# Patient Record
Sex: Female | Born: 1935 | Race: White | Hispanic: No | Smoking: Former smoker
Health system: Southern US, Community
[De-identification: ages and names within clinical notes are randomized; demographics above are authoritative.]

## PROBLEM LIST (undated history)

## (undated) DIAGNOSIS — J449 Chronic obstructive pulmonary disease, unspecified: Secondary | ICD-10-CM

## (undated) DIAGNOSIS — I10 Essential (primary) hypertension: Secondary | ICD-10-CM

## (undated) DIAGNOSIS — Z87891 Personal history of nicotine dependence: Secondary | ICD-10-CM

## (undated) DIAGNOSIS — H409 Unspecified glaucoma: Secondary | ICD-10-CM

## (undated) DIAGNOSIS — N289 Disorder of kidney and ureter, unspecified: Secondary | ICD-10-CM

## (undated) DIAGNOSIS — E039 Hypothyroidism, unspecified: Secondary | ICD-10-CM

## (undated) DIAGNOSIS — J302 Other seasonal allergic rhinitis: Secondary | ICD-10-CM

---

## 2014-01-08 ENCOUNTER — Inpatient Hospital Stay (HOSPITAL_COMMUNITY)
Admission: AD | Admit: 2014-01-08 | Discharge: 2014-01-21 | DRG: 682 | Disposition: A | Discharge status: Skilled Nursing Facility | Payer: Medicare Other | Source: Other Acute Inpatient Hospital | Attending: Internal Medicine | Admitting: Internal Medicine

## 2014-01-08 ENCOUNTER — Inpatient Hospital Stay (HOSPITAL_COMMUNITY): Payer: Medicare Other

## 2014-01-08 DIAGNOSIS — R0902 Hypoxemia: Secondary | ICD-10-CM

## 2014-01-08 DIAGNOSIS — G9340 Encephalopathy, unspecified: Secondary | ICD-10-CM | POA: Diagnosis present

## 2014-01-08 DIAGNOSIS — E872 Acidosis, unspecified: Secondary | ICD-10-CM | POA: Diagnosis present

## 2014-01-08 DIAGNOSIS — E039 Hypothyroidism, unspecified: Secondary | ICD-10-CM | POA: Diagnosis present

## 2014-01-08 DIAGNOSIS — N178 Other acute kidney failure: Secondary | ICD-10-CM | POA: Diagnosis present

## 2014-01-08 DIAGNOSIS — N179 Acute kidney failure, unspecified: Secondary | ICD-10-CM | POA: Diagnosis present

## 2014-01-08 DIAGNOSIS — R630 Anorexia: Secondary | ICD-10-CM | POA: Diagnosis not present

## 2014-01-08 DIAGNOSIS — E785 Hyperlipidemia, unspecified: Secondary | ICD-10-CM | POA: Diagnosis not present

## 2014-01-08 DIAGNOSIS — I82622 Acute embolism and thrombosis of deep veins of left upper extremity: Secondary | ICD-10-CM | POA: Diagnosis present

## 2014-01-08 DIAGNOSIS — I959 Hypotension, unspecified: Secondary | ICD-10-CM | POA: Diagnosis present

## 2014-01-08 DIAGNOSIS — N170 Acute kidney failure with tubular necrosis: Principal | ICD-10-CM | POA: Diagnosis present

## 2014-01-08 DIAGNOSIS — R5381 Other malaise: Secondary | ICD-10-CM | POA: Diagnosis not present

## 2014-01-08 DIAGNOSIS — Z79899 Other long term (current) drug therapy: Secondary | ICD-10-CM

## 2014-01-08 DIAGNOSIS — K219 Gastro-esophageal reflux disease without esophagitis: Secondary | ICD-10-CM | POA: Diagnosis present

## 2014-01-08 DIAGNOSIS — K5900 Constipation, unspecified: Secondary | ICD-10-CM | POA: Diagnosis not present

## 2014-01-08 DIAGNOSIS — K529 Noninfective gastroenteritis and colitis, unspecified: Secondary | ICD-10-CM | POA: Diagnosis not present

## 2014-01-08 DIAGNOSIS — Z452 Encounter for adjustment and management of vascular access device: Secondary | ICD-10-CM

## 2014-01-08 DIAGNOSIS — E875 Hyperkalemia: Secondary | ICD-10-CM | POA: Diagnosis present

## 2014-01-08 DIAGNOSIS — E8729 Other acidosis: Secondary | ICD-10-CM | POA: Insufficient documentation

## 2014-01-08 DIAGNOSIS — J9601 Acute respiratory failure with hypoxia: Secondary | ICD-10-CM | POA: Diagnosis present

## 2014-01-08 DIAGNOSIS — Z7982 Long term (current) use of aspirin: Secondary | ICD-10-CM | POA: Diagnosis not present

## 2014-01-08 DIAGNOSIS — E876 Hypokalemia: Secondary | ICD-10-CM | POA: Insufficient documentation

## 2014-01-08 DIAGNOSIS — J449 Chronic obstructive pulmonary disease, unspecified: Secondary | ICD-10-CM | POA: Diagnosis not present

## 2014-01-08 DIAGNOSIS — K567 Ileus, unspecified: Secondary | ICD-10-CM

## 2014-01-08 DIAGNOSIS — R197 Diarrhea, unspecified: Secondary | ICD-10-CM | POA: Diagnosis present

## 2014-01-08 DIAGNOSIS — J810 Acute pulmonary edema: Secondary | ICD-10-CM | POA: Insufficient documentation

## 2014-01-08 DIAGNOSIS — I10 Essential (primary) hypertension: Secondary | ICD-10-CM | POA: Diagnosis present

## 2014-01-08 DIAGNOSIS — D649 Anemia, unspecified: Secondary | ICD-10-CM | POA: Diagnosis not present

## 2014-01-08 DIAGNOSIS — R579 Shock, unspecified: Secondary | ICD-10-CM | POA: Diagnosis present

## 2014-01-08 DIAGNOSIS — I493 Ventricular premature depolarization: Secondary | ICD-10-CM | POA: Diagnosis not present

## 2014-01-08 DIAGNOSIS — I5040 Unspecified combined systolic (congestive) and diastolic (congestive) heart failure: Secondary | ICD-10-CM | POA: Diagnosis present

## 2014-01-08 DIAGNOSIS — E038 Other specified hypothyroidism: Secondary | ICD-10-CM | POA: Insufficient documentation

## 2014-01-08 HISTORY — DX: Personal history of nicotine dependence: Z87.891

## 2014-01-08 HISTORY — DX: Chronic obstructive pulmonary disease, unspecified: J44.9

## 2014-01-08 HISTORY — DX: Hypothyroidism, unspecified: E03.9

## 2014-01-08 HISTORY — DX: Essential (primary) hypertension: I10

## 2014-01-08 HISTORY — DX: Other seasonal allergic rhinitis: J30.2

## 2014-01-08 HISTORY — DX: Unspecified glaucoma: H40.9

## 2014-01-08 LAB — BLOOD GAS, ARTERIAL
ACID-BASE DEFICIT: 14.7 mmol/L — AB (ref 0.0–2.0)
Bicarbonate: 12.9 mEq/L — ABNORMAL LOW (ref 20.0–24.0)
Drawn by: 41934
FIO2: 0.28 %
O2 Saturation: 96.4 %
PATIENT TEMPERATURE: 98.6
PCO2 ART: 41.9 mmHg (ref 35.0–45.0)
TCO2: 14.2 mmol/L (ref 0–100)
pH, Arterial: 7.116 — CL (ref 7.350–7.450)
pO2, Arterial: 112 mmHg — ABNORMAL HIGH (ref 80.0–100.0)

## 2014-01-08 LAB — COMPREHENSIVE METABOLIC PANEL
ALBUMIN: 2.6 g/dL — AB (ref 3.5–5.2)
ALBUMIN: 2.6 g/dL — AB (ref 3.5–5.2)
ALT: 7 U/L (ref 0–35)
ALT: 7 U/L (ref 0–35)
AST: 12 U/L (ref 0–37)
AST: 8 U/L (ref 0–37)
Alkaline Phosphatase: 78 U/L (ref 39–117)
Alkaline Phosphatase: 85 U/L (ref 39–117)
Anion gap: 19 — ABNORMAL HIGH (ref 5–15)
Anion gap: 20 — ABNORMAL HIGH (ref 5–15)
BUN: 91 mg/dL — ABNORMAL HIGH (ref 6–23)
BUN: 89 mg/dL — ABNORMAL HIGH (ref 6–23)
CO2: 13 mEq/L — ABNORMAL LOW (ref 19–32)
CO2: 13 mEq/L — ABNORMAL LOW (ref 19–32)
CREATININE: 11.12 mg/dL — AB (ref 0.50–1.10)
Calcium: 9.1 mg/dL (ref 8.4–10.5)
Calcium: 9.2 mg/dL (ref 8.4–10.5)
Chloride: 105 mEq/L (ref 96–112)
Chloride: 106 mEq/L (ref 96–112)
Creatinine, Ser: 11.83 mg/dL — ABNORMAL HIGH (ref 0.50–1.10)
GFR calc Af Amer: 3 mL/min — ABNORMAL LOW (ref >90)
GFR calc Af Amer: 3 mL/min — ABNORMAL LOW (ref >90)
GFR calc non Af Amer: 3 mL/min — ABNORMAL LOW (ref >90)
GFR calc non Af Amer: 3 mL/min — ABNORMAL LOW (ref >90)
Glucose, Bld: 84 mg/dL (ref 70–99)
Glucose, Bld: 94 mg/dL (ref 70–99)
Potassium: >7.7 mEq/L (ref 3.7–5.3)
SODIUM: 139 meq/L (ref 137–147)
Sodium: 137 mEq/L (ref 137–147)
TOTAL PROTEIN: 7.1 g/dL (ref 6.0–8.3)
Total Bilirubin: <0.2 mg/dL — ABNORMAL LOW (ref 0.3–1.2)
Total Bilirubin: <0.2 mg/dL — ABNORMAL LOW (ref 0.3–1.2)
Total Protein: 7.4 g/dL (ref 6.0–8.3)

## 2014-01-08 LAB — URINE MICROSCOPIC-ADD ON

## 2014-01-08 LAB — PROTEIN / CREATININE RATIO, URINE
CREATININE, URINE: 36.48 mg/dL
Protein Creatinine Ratio: 3.14 — ABNORMAL HIGH (ref 0.00–0.15)
Total Protein, Urine: 114.7 mg/dL

## 2014-01-08 LAB — CBC WITH DIFFERENTIAL/PLATELET
Basophils Absolute: 0.1 10*3/uL (ref 0.0–0.1)
Basophils Relative: 1 % (ref 0–1)
EOS PCT: 4 % (ref 0–5)
Eosinophils Absolute: 0.4 10*3/uL (ref 0.0–0.7)
HEMATOCRIT: 40.4 % (ref 36.0–46.0)
HEMOGLOBIN: 12.9 g/dL (ref 12.0–15.0)
LYMPHS PCT: 14 % (ref 12–46)
Lymphs Abs: 1.5 10*3/uL (ref 0.7–4.0)
MCH: 29 pg (ref 26.0–34.0)
MCHC: 31.9 g/dL (ref 30.0–36.0)
MCV: 90.8 fL (ref 78.0–100.0)
MONO ABS: 0.9 10*3/uL (ref 0.1–1.0)
MONOS PCT: 9 % (ref 3–12)
Neutro Abs: 8 10*3/uL — ABNORMAL HIGH (ref 1.7–7.7)
Neutrophils Relative %: 72 % (ref 43–77)
Platelets: 253 10*3/uL (ref 150–400)
RBC: 4.45 MIL/uL (ref 3.87–5.11)
RDW: 14.4 % (ref 11.5–15.5)
WBC: 10.9 10*3/uL — AB (ref 4.0–10.5)

## 2014-01-08 LAB — URINALYSIS, ROUTINE W REFLEX MICROSCOPIC
Bilirubin Urine: NEGATIVE
Glucose, UA: NEGATIVE mg/dL
Ketones, ur: 15 mg/dL — AB
Nitrite: NEGATIVE
PROTEIN: 100 mg/dL — AB
SPECIFIC GRAVITY, URINE: 1.009 (ref 1.005–1.030)
UROBILINOGEN UA: 0.2 mg/dL (ref 0.0–1.0)
pH: 7 (ref 5.0–8.0)

## 2014-01-08 LAB — TSH: TSH: 1.84 u[IU]/mL (ref 0.350–4.500)

## 2014-01-08 LAB — MRSA PCR SCREENING: MRSA BY PCR: NEGATIVE

## 2014-01-08 LAB — GLUCOSE, CAPILLARY: Glucose-Capillary: 91 mg/dL (ref 70–99)

## 2014-01-08 MED ORDER — IPRATROPIUM BROMIDE 0.02 % IN SOLN
0.5000 mg | Freq: Four times a day (QID) | RESPIRATORY_TRACT | Status: DC
  Administered 2014-01-08: 0.5 mg via RESPIRATORY_TRACT
  Filled 2014-01-08: qty 2.5

## 2014-01-08 MED ORDER — SODIUM CHLORIDE 0.9 % IV SOLN: 100.0000 mL | INTRAVENOUS | Status: DC | PRN

## 2014-01-08 MED ORDER — ACETAMINOPHEN 650 MG RE SUPP: 650.0000 mg | Freq: Four times a day (QID) | RECTAL | Status: DC | PRN

## 2014-01-08 MED ORDER — CETYLPYRIDINIUM CHLORIDE 0.05 % MT LIQD: 7.0000 mL | Freq: Four times a day (QID) | OROMUCOSAL | Status: DC

## 2014-01-08 MED ORDER — PANTOPRAZOLE SODIUM 40 MG IV SOLR: 40.0000 mg | Freq: Every day | INTRAVENOUS | Status: DC

## 2014-01-08 MED ORDER — HEPARIN SODIUM (PORCINE) 1000 UNIT/ML DIALYSIS
1000.0000 [IU] | INTRAMUSCULAR | Status: DC | PRN
  Filled 2014-01-08: qty 1

## 2014-01-08 MED ORDER — ACETAMINOPHEN 325 MG PO TABS: 650.0000 mg | ORAL_TABLET | Freq: Four times a day (QID) | ORAL | Status: DC | PRN

## 2014-01-08 MED ORDER — SODIUM CHLORIDE 0.9 % IV SOLN: 250.0000 mL | INTRAVENOUS | Status: DC | PRN

## 2014-01-08 MED ORDER — CEFTRIAXONE SODIUM IN DEXTROSE 20 MG/ML IV SOLN
1.0000 g | INTRAVENOUS | Status: DC
  Administered 2014-01-08 – 2014-01-10 (×3): 1 g via INTRAVENOUS
  Filled 2014-01-08 (×4): qty 50

## 2014-01-08 MED ORDER — FOLIC ACID 1 MG PO TABS
1.0000 mg | ORAL_TABLET | Freq: Every day | ORAL | Status: DC
  Administered 2014-01-08 – 2014-01-21 (×14): 1 mg via ORAL
  Filled 2014-01-08 (×14): qty 1

## 2014-01-08 MED ORDER — SODIUM CHLORIDE 0.9 % IV SOLN
INTRAVENOUS | Status: AC
Start: 2014-01-08 — End: 2014-01-09
  Administered 2014-01-08 – 2014-01-09 (×2): via INTRAVENOUS

## 2014-01-08 MED ORDER — SODIUM CHLORIDE 0.9 % IJ SOLN
3.0000 mL | Freq: Two times a day (BID) | INTRAMUSCULAR | Status: DC
  Administered 2014-01-09 (×2): 3 mL via INTRAVENOUS

## 2014-01-08 MED ORDER — ADULT MULTIVITAMIN W/MINERALS CH
1.0000 | ORAL_TABLET | Freq: Every day | ORAL | Status: DC
  Administered 2014-01-08 – 2014-01-21 (×14): 1 via ORAL
  Filled 2014-01-08 (×14): qty 1

## 2014-01-08 MED ORDER — VITAMIN B-1 100 MG PO TABS
100.0000 mg | ORAL_TABLET | Freq: Every day | ORAL | Status: DC
  Administered 2014-01-08 – 2014-01-21 (×14): 100 mg via ORAL
  Filled 2014-01-08 (×14): qty 1

## 2014-01-08 MED ORDER — ONDANSETRON HCL 4 MG/2ML IJ SOLN
4.0000 mg | Freq: Four times a day (QID) | INTRAMUSCULAR | Status: DC | PRN
  Administered 2014-01-10 – 2014-01-13 (×6): 4 mg via INTRAVENOUS
  Filled 2014-01-08 (×7): qty 2

## 2014-01-08 MED ORDER — ALBUTEROL SULFATE (2.5 MG/3ML) 0.083% IN NEBU: 2.5000 mg | INHALATION_SOLUTION | RESPIRATORY_TRACT | Status: DC | PRN

## 2014-01-08 MED ORDER — SODIUM POLYSTYRENE SULFONATE 15 GM/60ML PO SUSP
45.0000 g | Freq: Once | ORAL | Status: AC
  Administered 2014-01-08: 45 g via ORAL
  Filled 2014-01-08: qty 180

## 2014-01-08 MED ORDER — CHLORHEXIDINE GLUCONATE 0.12 % MT SOLN: 15.0000 mL | Freq: Two times a day (BID) | OROMUCOSAL | Status: DC

## 2014-01-08 MED ORDER — ALBUTEROL SULFATE (2.5 MG/3ML) 0.083% IN NEBU
2.5000 mg | INHALATION_SOLUTION | Freq: Four times a day (QID) | RESPIRATORY_TRACT | Status: DC
  Administered 2014-01-08: 2.5 mg via RESPIRATORY_TRACT
  Filled 2014-01-08: qty 3

## 2014-01-08 MED ORDER — OXYCODONE HCL 5 MG PO TABS: 5.0000 mg | ORAL_TABLET | ORAL | Status: DC | PRN

## 2014-01-08 MED ORDER — ONDANSETRON HCL 4 MG PO TABS: 4.0000 mg | ORAL_TABLET | Freq: Four times a day (QID) | ORAL | Status: DC | PRN

## 2014-01-08 MED ORDER — SODIUM CHLORIDE 0.9 % IJ SOLN
3.0000 mL | Freq: Two times a day (BID) | INTRAMUSCULAR | Status: DC
  Administered 2014-01-09: 3 mL via INTRAVENOUS

## 2014-01-08 MED ORDER — HEPARIN SODIUM (PORCINE) 5000 UNIT/ML IJ SOLN
5000.0000 [IU] | Freq: Three times a day (TID) | INTRAMUSCULAR | Status: DC
  Administered 2014-01-09 – 2014-01-21 (×36): 5000 [IU] via SUBCUTANEOUS
  Filled 2014-01-08 (×41): qty 1

## 2014-01-08 MED ORDER — ALTEPLASE 2 MG IJ SOLR
2.0000 mg | Freq: Once | INTRAMUSCULAR | Status: AC | PRN
Start: 2014-01-08 — End: 2014-01-08

## 2014-01-08 MED ORDER — SODIUM CHLORIDE 0.9 % IJ SOLN: 3.0000 mL | INTRAMUSCULAR | Status: DC | PRN

## 2014-01-08 NOTE — Procedures (Signed)
Hemodialysis Insertion Procedure Note [name] [id number] 01-27-1935  Procedure: Insertion of Hemodialysis Catheter Type: 3 port  Indications: Hemodialysis   Procedure Details Consent: Risks of procedure as well as the alternatives and risks of each were explained to the (patient/caregiver).  Consent for procedure obtained. Time Out: Verified patient identification, verified procedure, site/side was marked, verified correct patient position, special equipment/implants available, medications/allergies/relevent history reviewed, required imaging and test results available.  Performed  Maximum sterile technique was used including antiseptics, cap, gloves, gown, hand hygiene, mask and sheet. Skin prep: Chlorhexidine; local anesthetic administered A antimicrobial bonded/coated triple lumen catheter was placed in the right internal jugular vein using the Seldinger technique. Ultrasound guidance used.Yes.   Catheter placed to 20 cm. Blood aspirated via all 3 ports and then flushed x 3. Line sutured x 2 and dressing applied.  Evaluation Blood flow good Complications: No apparent complications Patient did tolerate procedure well. Chest X-ray ordered to verify placement.  CXR: pending.  [name] RoachPaul Hoffman, ACNP [place] [facility] Pager [phone] or [phone]  Procedure supervised by Dr. Molli KnockYacoub.  U/S used in placement.  [name] ReedyWesam G. Yacoub, M.D. [facility]. Pager: [phone]. After hours pager: [phone].

## 2014-01-08 NOTE — Consult Note (Signed)
[name] Admit Date: 01/08/2014 01/08/2014 [name] MissSANFORD, Strong, B Requesting Physician:  Welton FlakesKhan MD  Reason for Consult:  Hyperkalemia, Renal Failure HPI:  23F presented [facility] this AM with several weeks of N/V (nb/nb), a few days of nonbloody nonmelanotic diarrhea,  Anorexia, and found ot have SCr of 14, BUN in 90s, and K of 9.  Treated with dextrose/insulin, calcium, kayexalate,a nd last K at 12p was 8.0.  Pt on 20 lasix PO daily and 20 KCL daily.  Pt has COPD and unclear what other medical problems.  Hasn't seen MD in months as outpt.  Denies any known CKD.  No recent weight loss.  Denies hematuria, rashes.  Denies NSAIDs, recent ABX.  No contrast exposures.  Urinary frequency has 'dropped off'  EKG at [facility] reviewed, no peaked Ts, QRS around 100   No results found for: CREATININE] Balance of 12 systems is negative w/ exceptions as above  PMH No past medical history on file. PSH No past surgical history on file. FH No family history on file. SH  has no tobacco, alcohol, and drug history on file. Allergies  Allergies  Allergen Reactions  . Ciprofloxacin Nausea And Vomiting   Home medications Prior to Admission medications   Medication Sig Start Date End Date Taking? Authorizing Provider  ascorbic acid (VITAMIN C) 1000 MG tablet Take 1,000 mg by mouth 3 (three) times daily.   Yes Historical Provider, MD  aspirin EC 81 MG tablet Take 81 mg by mouth daily.   Yes Historical Provider, MD  atenolol (TENORMIN) 50 MG tablet Take 50 mg by mouth daily.   Yes Historical Provider, MD  azelastine (ASTELIN) 0.1 % nasal spray Place 1 spray into both nostrils daily. Use in each nostril as directed   Yes Historical Provider, MD  Brinzolamide-Brimonidine 1-0.2 % SUSP Place 1 drop into both eyes 2 (two) times daily.   Yes Historical Provider, MD  cholecalciferol (VITAMIN D) 1000 UNITS tablet Take 1,000 Units by mouth daily.   Yes Historical Provider, MD  diltiazem (CARDIZEM) 30 MG  tablet Take 30 mg by mouth 3 (three) times daily.   Yes Historical Provider, MD  Fluticasone-Salmeterol (ADVAIR DISKUS) 250-50 MCG/DOSE AEPB Inhale 1 puff into the lungs 2 (two) times daily.   Yes Historical Provider, MD  furosemide (LASIX) 20 MG tablet Take 20 mg by mouth daily.    Yes Historical Provider, MD  Garlic 1000 MG CAPS Take 1 capsule by mouth daily.   Yes Historical Provider, MD  latanoprost (XALATAN) 0.005 % ophthalmic solution Place 1 drop into both eyes at bedtime.   Yes Historical Provider, MD  levothyroxine (SYNTHROID, LEVOTHROID) 25 MCG tablet Take 25 mcg by mouth daily before breakfast.   Yes Historical Provider, MD  loratadine (CLARITIN) 10 MG tablet Take 10 mg by mouth daily.   Yes Historical Provider, MD  LUTEIN-ZEAXANTHIN PO Take 1 tablet by mouth daily.   Yes Historical Provider, MD  Multiple Vitamins-Minerals (MULTIVITAMIN WITH MINERALS) tablet Take 1 tablet by mouth daily.   Yes Historical Provider, MD  Omega-3 Fatty Acids (FISH OIL) 1000 MG CAPS Take 1 capsule by mouth daily.   Yes Historical Provider, MD  omeprazole (PRILOSEC) 40 MG capsule Take 40 mg by mouth daily.   Yes Historical Provider, MD  pilocarpine (PILOCAR) 1 % ophthalmic solution Place 1 drop into the right eye 4 (four) times daily.   Yes Historical Provider, MD  PROAIR HFA 108 (90 BASE) MCG/ACT inhaler Inhale 2 puffs into the lungs every  6 (six) hours as needed for wheezing or shortness of breath.  11/20/13  Yes Historical Provider, MD    Current Medications Scheduled Meds: . albuterol  2.5 mg Nebulization Q6H  . cefTRIAXone (ROCEPHIN)  IV  1 g Intravenous Q24H  . folic acid  1 mg Oral Daily  . heparin  5,000 Units Subcutaneous 3 times per day  . ipratropium  0.5 mg Nebulization Q6H  . multivitamin with minerals  1 tablet Oral Daily  . sodium chloride  3 mL Intravenous Q12H  . sodium chloride  3 mL Intravenous Q12H  . sodium polystyrene  45 g Oral Once  . thiamine  100 mg Oral Daily   Continuous  Infusions: . sodium chloride     PRN Meds:.sodium chloride, acetaminophen **OR** acetaminophen, ondansetron **OR** ondansetron (ZOFRAN) IV, oxyCODONE, sodium chloride  CBC  Recent Labs Lab 01/08/14 2007  WBC 10.9*  NEUTROABS 8.0*  HGB 12.9  HCT 40.4  MCV 90.8  PLT 253   Basic Metabolic Panel No results for input(s): NA, K, CL, CO2, GLUCOSE, BUN, CREATININE, ALB, CALCIUM, PHOS in the last 168 hours.  Physical Exam  Blood pressure 142/69, pulse 88, temperature 98.1 F (36.7 C), temperature source Oral, resp. rate 17, height 5\' 8"  (1.727 m), weight 93.214 kg (205 lb 8 oz), SpO2 100 %. GEN: NAD, confused, poor historian ENT: NCAT EYES: EOMI CV: RRR, no rub PULM: CTAB, no crackles ABD: s/nt/nd.  No suprapubic fullness SKIN: no rashes/lesions EXT:No LEE   Assessment 29F with renal failure of unclear time course, likely uremia, life threatening hyperkalemia, metabolic acidosis.  No clear nephrotoxins, no evidence of GN.  Could be volume depeted (diarrhea, poor PO, lasix, PO KCl).  This could be progression of CKD.    1. Uremia w/ N/V, anorexia, malaise 2. Hyperkalemia.  No EKG changes, s/p medical therapy 3. Metabolic Acidosis 4. Azotemia, unclear time course and cause 5. Diarrhea x?1d 6. COPD   PLAN 1. HD tonight to treat K, no UF 2. Need to obtain any previous labs 3. Renal US 4. UA, UP/C 5. Will need GN w/u if proteinuria or hematuria present 6. SPEP and sFLC 7. Hydrate at 12700mL/hr NS 8. ABG 9. Foley Cathter, strict I/Os 10. C diff    Sabra Heckyan Sanford MD 01/08/2014, 8:41 PM

## 2014-01-08 NOTE — Progress Notes (Addendum)
CRITICAL VALUE ALERT  Critical value received:  K > 7.7  Date of notification:  01/08/2014   Time of notification:  2300  Critical value read back:Yes.    Nurse who received alert:  Cresenciano LickMikaela Tinna Kolker, RN   MD notified (1st page):  Welton FlakesKhan, MD  Time of first page:  2310   Time MD responded:  2315

## 2014-01-08 NOTE — Progress Notes (Signed)
[facility] arrived [facility] via [facility].  Patient was moved to the bed without incident and blankets were applied for comfort.  CHG bath was performed by RN and NT.  Peripheral IV noted to be in left wrist and infusing 0.9% normal saline via gravity.  IV pump ordered at this time. Cardiac leads replaced and all cardiac monitors applied.  Vital signs were obtained by NT.  2L [place] applied for sats <90%.  Patient confirms she wears 2L  at home.  New gown and patient ID bracelet applied.  CCMD called and notified of new admission.  Patient complains of intermittent nausea, vomiting, and diarrhea and denies any pain.  She appears stable at this time.  Will continue to monitor.

## 2014-01-08 NOTE — H&P (Signed)
[facility] History and Physical  [name] Larsenellie Cuadrado ZOX:[id number] DOB: 1935/02/08 DOA: 01/08/2014  [facility] PCP: No primary care provider on file.   Chief Complaint: Diarrhea  HPI: [name] is a 79 y.o. female presents [facility] a [facility]. Not a reliable historian and no family is present. Patient apparently has a past history of COPD presented to the ED with complaints of diarrhea. She states that this has been going on for a while. She states that she has had some abdominal pain associated. She also notes decreased urine output. She admits to having shortness of breath, She has no prior history of kidney disease. At [place] she was noted to have hyperkalemia with a potassium of 9 this was aggressively treated and came down minimally to a final reading of 8 prior to transfer. Patient also was noted to have a creatinine of 14 and therefore [facility].   Review of Systems:  Constitutional:  No weight loss, night sweats, +Fevers, chills, +fatigue.  HEENT:  No headaches, No sneezing, itching, ear ache, nasal congestion, post nasal drip,  Cardio-vascular:  No chest pain, Orthopnea, PND, +swelling in lower extremities  GI:  No heartburn, indigestion, +abdominal pain, +nausea, vomiting, +diarrhea, +loss of appetite  Resp:  +shortness of breath. no productive cough, no coughing up of blood. +wheezing  Skin:  no rash or lesions GU:  No flank pain +decreased urine output.  Musculoskeletal:  No joint pain or swelling. No decreased range of motion.  Psych:  No change in mood or affect. No depression or anxiety.   No past medical history on file. No past surgical history on file. Social History:  has no tobacco, alcohol, and drug history on file.  No Known Allergies  No family history on file.   Prior to Admission medications   Not on File   Physical Exam: Filed Vitals:   01/08/14 1903  BP:  142/69  Pulse: 96  Temp: 98.1 F (36.7 C)  TempSrc: Oral  Resp: 26  Height: 5\' 8"  (1.727 m)  Weight: 93.214 kg (205 lb 8 oz)  SpO2: 88%    Wt Readings from Last 3 Encounters:  01/08/14 93.214 kg (205 lb 8 oz)    General:  Appears calm and comfortable Eyes: PERRL, normal lids, irises & conjunctiva ENT: grossly normal hearing, lips & tongue Neck: no LAD, masses or thyromegaly Cardiovascular: RRR, no m/r/g. + LE edema. Respiratory: CTA bilaterally, no w/r/r. Normal respiratory effort. Abdomen: soft, ntnd distended Skin: no rash or induration seen on limited exam Musculoskeletal: grossly normal tone BUE/BLE Psychiatric: grossly normal mood and affect, speech fluent and appropriate Neurologic: grossly non-focal.          Labs on Admission:  Basic Metabolic Panel: No results for input(s): NA, K, CL, CO2, GLUCOSE, BUN, CREATININE, CALCIUM, MG, PHOS in the last 168 hours. Liver Function Tests: No results for input(s): AST, ALT, ALKPHOS, BILITOT, PROT, ALBUMIN in the last 168 hours. No results for input(s): LIPASE, AMYLASE in the last 168 hours. No results for input(s): AMMONIA in the last 168 hours. CBC: No results for input(s): WBC, NEUTROABS, HGB, HCT, MCV, PLT in the last 168 hours. Cardiac Enzymes: No results for input(s): CKTOTAL, CKMB, CKMBINDEX, TROPONINI in the last 168 hours.  BNP (last 3 results) No results for input(s): PROBNP in the last 8760 hours. CBG: No results for input(s): GLUCAP in the last 168 hours.  Radiological Exams on Admission: No results found.  EKG: Pending  Assessment/Plan Active Problems:   Acute renal failure   Hyperkalemia   Noninfectious gastroenteritis and colitis   Diarrhea   1. Acute renal Failure -I do not have baseline labs to look at here -spoke to nephrology and they will see her tonight -will continue with IVF for now -she may need dialysis -will hold lasix  2. Severe Hyperkalemia -will get nephrology to see her  now -will repeat labs STAT -will give kayexalate now -likely will need dialysis acutely  3. Acidosis -related to the renal failure -check ABG now  4. COPD -patient is on home oxygen will continue -will check ABG -will start on duonebs now  5. Gastroenteritis -appears to be the initial presentation though she is somewhat vague -will hydrate  6. Diarrhea -will monitor -check stools for C Diff  7. Hypothyroid -will check TSH -will continue with home medications  8. Hyperlipidemia -will monitor labs  9. Hypertension -Will monitor pressures -hold lasix -may need medications reviewed and adjusted -pharmacy consultation ordered  Code Status: Full Code (must indicate code status--if unknown or must be presumed, indicate so) DVT Prophylaxis:heparin Family Communication: None (indicate person spoken with, if applicable, with phone number if by telephone) Disposition Plan: Home (indicate anticipated LOS)  Time spent: 70min  [facility],Filomena Pokorney A [facility] Pager [phone]

## 2014-01-08 NOTE — Progress Notes (Signed)
CRITICAL VALUE ALERT  Critical value received:  PH: 7.11 Bicarb: 12.9 O2: 112  Date of notification:  01/08/2014   Time of notification:  2310  Critical value read back:Yes.    Nurse who received alert:  Cresenciano LickMikaela Nadege Carriger, RN   MD notified (1st page):  Welton FlakesKhan, MD   Time of first page:  2310   Time MD responded:  2315

## 2014-01-08 NOTE — Progress Notes (Addendum)
CRITICAL VALUE ALERT  Critical value received:  K > 7.7  Date of notification:  01/08/2014   Time of notification:  2100  Critical value read back:Yes.    Nurse who received alert:  Foye DeerMikaela Marabelle Cushman,RN   MD notified (1st page):  Welton FlakesKhan, MD  Time of first page:  2100    Time MD responded:  2115

## 2014-01-08 NOTE — Progress Notes (Signed)
ANTIBIOTIC CONSULT NOTE - INITIAL  Pharmacy Consult for Ceftriaxone Indication: UTI  No Known Allergies  Patient Measurements: Height: 5\' 8"  (172.7 cm) Weight: 205 lb 8 oz (93.214 kg) IBW/kg (Calculated) : 63.9  Vital Signs: Temp: 98.1 F (36.7 C) (11/24 1903) Temp Source: Oral (11/24 1903) BP: 142/69 mmHg (11/24 1903) Pulse Rate: 96 (11/24 1903) Intake/Output from previous day:   Intake/Output from this shift:    Labs: No results for input(s): WBC, HGB, PLT, LABCREA, CREATININE in the last 72 hours. CrCl cannot be calculated (Patient has no serum creatinine result on file.). No results for input(s): VANCOTROUGH, VANCOPEAK, VANCORANDOM, GENTTROUGH, GENTPEAK, GENTRANDOM, TOBRATROUGH, TOBRAPEAK, TOBRARND, AMIKACINPEAK, AMIKACINTROU, AMIKACIN in the last 72 hours.   Microbiology: No results found for this or any previous visit (from the past 720 hour(s)).  Medical History: No past medical history on file.  Medications:  No prescriptions prior to admission   Scheduled:   Assessment: 79yo [facility]. Pharmacy is consulted to dose Ceftriaxone for UTI. Patient is afebrile, WBC and sCr are pending.  Goal of Therapy:  Resolution of infection  Plan:  Ceftriaxone 1g IV q24h Follow up culture results  Pharmacy will sign off for now. Please re-consult if needed.  Arlean Hoppingorey M. Newman PiesBall, PharmD Clinical Pharmacist Pager [phone] 01/08/2014,7:28 PM

## 2014-01-08 NOTE — Progress Notes (Addendum)
Dialysis at the bedside waiting for Chest x-ray to result, [name] has been placed, Renal Ultrasound complete. Assisted NP with HD Central Line placement. Pt is complaining of less N&V. No emesis since shift change.

## 2014-01-09 ENCOUNTER — Inpatient Hospital Stay (HOSPITAL_COMMUNITY): Payer: Medicare Other

## 2014-01-09 ENCOUNTER — Encounter (HOSPITAL_COMMUNITY): Payer: Self-pay

## 2014-01-09 DIAGNOSIS — J9601 Acute respiratory failure with hypoxia: Secondary | ICD-10-CM | POA: Diagnosis present

## 2014-01-09 DIAGNOSIS — K219 Gastro-esophageal reflux disease without esophagitis: Secondary | ICD-10-CM | POA: Diagnosis present

## 2014-01-09 DIAGNOSIS — I10 Essential (primary) hypertension: Secondary | ICD-10-CM | POA: Insufficient documentation

## 2014-01-09 DIAGNOSIS — J449 Chronic obstructive pulmonary disease, unspecified: Secondary | ICD-10-CM | POA: Diagnosis present

## 2014-01-09 DIAGNOSIS — I959 Hypotension, unspecified: Secondary | ICD-10-CM | POA: Diagnosis present

## 2014-01-09 DIAGNOSIS — J410 Simple chronic bronchitis: Secondary | ICD-10-CM

## 2014-01-09 DIAGNOSIS — N178 Other acute kidney failure: Secondary | ICD-10-CM | POA: Diagnosis present

## 2014-01-09 DIAGNOSIS — G9340 Encephalopathy, unspecified: Secondary | ICD-10-CM | POA: Diagnosis present

## 2014-01-09 DIAGNOSIS — I517 Cardiomegaly: Secondary | ICD-10-CM

## 2014-01-09 LAB — COMPREHENSIVE METABOLIC PANEL
ALT: 6 U/L (ref 0–35)
AST: 7 U/L (ref 0–37)
Albumin: 2.4 g/dL — ABNORMAL LOW (ref 3.5–5.2)
Alkaline Phosphatase: 73 U/L (ref 39–117)
Anion gap: 19 — ABNORMAL HIGH (ref 5–15)
BUN: 44 mg/dL — AB (ref 6–23)
CALCIUM: 8.1 mg/dL — AB (ref 8.4–10.5)
CO2: 20 mEq/L (ref 19–32)
Chloride: 101 mEq/L (ref 96–112)
Creatinine, Ser: 6.64 mg/dL — ABNORMAL HIGH (ref 0.50–1.10)
GFR calc non Af Amer: 5 mL/min — ABNORMAL LOW (ref >90)
GFR, EST AFRICAN AMERICAN: 6 mL/min — AB (ref >90)
GLUCOSE: 84 mg/dL (ref 70–99)
Potassium: 4.8 mEq/L (ref 3.7–5.3)
Sodium: 140 mEq/L (ref 137–147)
Total Protein: 6.3 g/dL (ref 6.0–8.3)

## 2014-01-09 LAB — HEPATITIS B SURFACE ANTIBODY,QUALITATIVE: HEP B S AB: NEGATIVE

## 2014-01-09 LAB — GLUCOSE, CAPILLARY: GLUCOSE-CAPILLARY: 98 mg/dL (ref 70–99)

## 2014-01-09 LAB — CBC
HCT: 33.6 % — ABNORMAL LOW (ref 36.0–46.0)
Hemoglobin: 11.1 g/dL — ABNORMAL LOW (ref 12.0–15.0)
MCH: 29.6 pg (ref 26.0–34.0)
MCHC: 33 g/dL (ref 30.0–36.0)
MCV: 89.6 fL (ref 78.0–100.0)
PLATELETS: 205 10*3/uL (ref 150–400)
RBC: 3.75 MIL/uL — AB (ref 3.87–5.11)
RDW: 14.3 % (ref 11.5–15.5)
WBC: 11.4 10*3/uL — ABNORMAL HIGH (ref 4.0–10.5)

## 2014-01-09 LAB — BLOOD GAS, ARTERIAL
Acid-base deficit: 4.2 mmol/L — ABNORMAL HIGH (ref 0.0–2.0)
BICARBONATE: 21.1 meq/L (ref 20.0–24.0)
Drawn by: 10552
O2 Content: 2 L/min
O2 SAT: 97.1 %
PATIENT TEMPERATURE: 98.6
TCO2: 22.4 mmol/L (ref 0–100)
pCO2 arterial: 44 mmHg (ref 35.0–45.0)
pH, Arterial: 7.302 — ABNORMAL LOW (ref 7.350–7.450)
pO2, Arterial: 102 mmHg — ABNORMAL HIGH (ref 80.0–100.0)

## 2014-01-09 LAB — HEPATITIS B CORE ANTIBODY, TOTAL: HEP B C TOTAL AB: NONREACTIVE

## 2014-01-09 LAB — POTASSIUM
Potassium: 4 mEq/L (ref 3.7–5.3)
Potassium: 5 mEq/L (ref 3.7–5.3)
Potassium: 7.5 mEq/L (ref 3.7–5.3)

## 2014-01-09 LAB — CLOSTRIDIUM DIFFICILE BY PCR: CDIFFPCR: NEGATIVE

## 2014-01-09 LAB — KAPPA/LAMBDA LIGHT CHAINS
Kappa free light chain: 14.3 mg/dL — ABNORMAL HIGH (ref 0.33–1.94)
Kappa, lambda light chain ratio: 1.79 — ABNORMAL HIGH (ref 0.26–1.65)
Lambda free light chains: 8 mg/dL — ABNORMAL HIGH (ref 0.57–2.63)

## 2014-01-09 LAB — PROCALCITONIN: Procalcitonin: 0.35 ng/mL

## 2014-01-09 LAB — HEMOGLOBIN A1C
HEMOGLOBIN A1C: 6 % — AB (ref <5.7)
MEAN PLASMA GLUCOSE: 126 mg/dL — AB (ref <117)

## 2014-01-09 LAB — LACTIC ACID, PLASMA: Lactic Acid, Venous: 1.5 mmol/L (ref 0.5–2.2)

## 2014-01-09 LAB — STREP PNEUMONIAE URINARY ANTIGEN: Strep Pneumo Urinary Antigen: NEGATIVE

## 2014-01-09 LAB — HEPATITIS B SURFACE ANTIGEN: Hepatitis B Surface Ag: NEGATIVE

## 2014-01-09 LAB — TSH: TSH: 2.06 u[IU]/mL (ref 0.350–4.500)

## 2014-01-09 MED ORDER — LATANOPROST 0.005 % OP SOLN
1.0000 [drp] | Freq: Every day | OPHTHALMIC | Status: DC
  Administered 2014-01-10 – 2014-01-20 (×12): 1 [drp] via OPHTHALMIC
  Filled 2014-01-09 (×3): qty 2.5

## 2014-01-09 MED ORDER — PILOCARPINE HCL 1 % OP SOLN
1.0000 [drp] | Freq: Four times a day (QID) | OPHTHALMIC | Status: DC
  Administered 2014-01-09 – 2014-01-21 (×46): 1 [drp] via OPHTHALMIC
  Filled 2014-01-09: qty 15

## 2014-01-09 MED ORDER — SODIUM CHLORIDE 0.9 % IV BOLUS (SEPSIS)
250.0000 mL | Freq: Once | INTRAVENOUS | Status: AC
  Administered 2014-01-09: 250 mL via INTRAVENOUS

## 2014-01-09 MED ORDER — LEVOTHYROXINE SODIUM 100 MCG IV SOLR
12.5000 ug | Freq: Every day | INTRAVENOUS | Status: DC
Start: 2014-01-09 — End: 2014-01-09
  Administered 2014-01-09: 12.5 ug via INTRAVENOUS
  Filled 2014-01-09: qty 5

## 2014-01-09 MED ORDER — IPRATROPIUM-ALBUTEROL 0.5-2.5 (3) MG/3ML IN SOLN
3.0000 mL | Freq: Three times a day (TID) | RESPIRATORY_TRACT | Status: DC
  Administered 2014-01-09 – 2014-01-15 (×17): 3 mL via RESPIRATORY_TRACT
  Filled 2014-01-09 (×17): qty 3

## 2014-01-09 MED ORDER — LEVOTHYROXINE SODIUM 25 MCG PO TABS
25.0000 ug | ORAL_TABLET | Freq: Every day | ORAL | Status: DC
  Administered 2014-01-10 – 2014-01-21 (×12): 25 ug via ORAL
  Filled 2014-01-09 (×13): qty 1

## 2014-01-09 MED ORDER — ASPIRIN EC 81 MG PO TBEC
81.0000 mg | DELAYED_RELEASE_TABLET | Freq: Every day | ORAL | Status: DC
  Administered 2014-01-10 – 2014-01-21 (×12): 81 mg via ORAL
  Filled 2014-01-09 (×12): qty 1

## 2014-01-09 NOTE — Progress Notes (Signed)
[facility]  [name] ZOX:[id number] DOB: 09-13-1934 DOA: 01/08/2014 PCP: No primary care provider on file.  Brief narrative: 79 year old female w/ a hx of COPD and HTN who was transferred [facility].  She presented to that emergency department with complaints of diarrhea for at least 3 days. Apparently she had reported abdominal pain associated with this diarrhea and decreasing urinary output with shortness of breath. No apparent history of chronic kidney disease but baseline BUN and creatinine were unavailable at time of admission.  Evaluation [facility] was significant for acute renal failure w/ creatinine greater than 14 and potassium greater than 9. She was tx w/ medical therapies for this but potassium only decreased to 8. Her EKG showed widened QRS and subtly peaked T waves when compared to previous EKG from 2009. Because of her acuity and possible need for acute dialysis [facility]. Her initial chest x-ray demonstrated pulmonary edema  After arrival she was evaluated by Nephrology who determined urgent hemodialysis was indicated. Temporary dialysis catheter was inserted by PCCM and she underwent urgent hemodialysis. Her potassium easily corrected with dialysis; her creatinine also decreased to 6. Postdialysis patient had issues with hypotension that required fluid challenges.   As of the morning of 11/25 her blood pressure remains soft. She still remains somewhat encephalopathic and lethargic. She had diffuse crackles on pulmonary exam. Follow-up chest x-ray demonstrated interstitial edema with more of a latticelike pattern. Dr. Hyman HopesWebb with nephrology discussed the patient with me. Given her hypotension which is unexplained there were concerns she may have an undiagnosed pericardial effusion and evolving tamponade physiology so an echocardiogram was ordered.  Because of the hypotension a cortisol level was also checked. In addition given her evolving acute hypoxemic respiratory failure and chest x-ray changes there were concerns in the setting of hypotension she may not tolerate traditional dialysis and may need to therefore be initiated on CVVHD. PCCM was consulted.  HPI/Subjective: Patient lethargic but I was able to awaken her and she was able to answer some questions. She denied abdominal pain. Went back to sleep before she could answer additional questions  Assessment/Plan:  Acute renal failure / emergent Hyperkalemia w/ peaked T waves -baseline renal function unknown -labs improved with initial HD tx -Renal ultrasound demonstrated mild medical renal disease -BP soft with episodic hypotension so may need CVVHD -only 200 cc off with UF and CXR worsening -?due to diarrhea in setting of preadmit diuretics and anti HTN agents (ie ? Hypotension/ATN)  Acute respiratory failure with hypoxia/pulmonary edema -CXR worse and unclear if volume overload from ARF or infectious etiology -given diarrhea preadmit will check urine legionella -cont supportive care  Hypotension -post HD yet only 200 cc removed -cortisol pending  -?due to evolving sepsis - PCT unreliable in ARF-she has not had fever or leukocytosis since arrival but had infectious sx's preadmit -Nephrologist concern hypotension could reflect evolving pericardial effusion with tamponade physiology - echocardiogram ordered -did have abn UA but this was likely reflective of ARF- urine cx pending - cont empiric Rocephin for now -ck blood cx's  Acute encephalopathy -due to ARF and ?possible sepsis  Diarrhea -?acute gastroenteritis vs due to evolving ARF pre admit -ck C diff- note no further diarrhea since admission  COPD  -stable without wheezing -was on Advair and Proair at home  HTN  -BP soft to hypotensive -was on Tenormin and Lasix at  home  Hypothyroidism -ck TSH -cont  Synthroid  GERD   DVT prophylaxis: Subcutaneous heparin  Code Status: Full Family Communication: No family at bedside Disposition Plan/Expected LOS: Stepdown unit   Consultants: Nephrology/Dr. Hyman HopesWebb PCCM/Dr. Tyson AliasFeinstein  Procedures: Insertion hemodialysis catheter 11/24  11/25 - TTE - pending   Antibiotics: Rocephin 11/24 >  Objective: Blood pressure 109/47, pulse 96, temperature 99 F (37.2 C), temperature source Oral, resp. rate 18, height 5\' 8"  (1.727 m), weight 210 lb 5.1 oz (95.4 kg), SpO2 100 %.  Intake/Output Summary (Last 24 hours) at 01/09/14 1134 Last data filed at 01/09/14 1105  Gross per 24 hour  Intake 934.67 ml  Output    100 ml  Net 834.67 ml   Exam: Gen: No acute respiratory distress - lethargic somewhat difficult to awaken Chest: Bilateral crackles, respiratory effort nonlabored, 2 L Cardiac: Regular rate and rhythm, S1-S2, no rubs murmurs or gallops, no peripheral edema, no JVD-blood pressure has been soft and variable averaging anywhere from 87/54-142/70 Abdomen: Soft nontender nondistended without obvious hepatosplenomegaly, no ascites,  hypoactive bowel sounds Extremities: Symmetrical in appearance without cyanosis, clubbing or effusion  Scheduled Meds:  Scheduled Meds: . cefTRIAXone (ROCEPHIN)  IV  1 g Intravenous Q24H  . folic acid  1 mg Oral Daily  . heparin  5,000 Units Subcutaneous 3 times per day  . ipratropium-albuterol  3 mL Nebulization TID  . multivitamin with minerals  1 tablet Oral Daily  . sodium chloride  3 mL Intravenous Q12H  . sodium chloride  3 mL Intravenous Q12H  . thiamine  100 mg Oral Daily   Data Reviewed: Basic Metabolic Panel:  Recent Labs Lab 01/08/14 2007 01/08/14 2200 01/09/14 0001 01/09/14 0104 01/09/14 0319 01/09/14 0350  NA 137 139  --   --  140  --   K >7.7* >7.7* 7.5* 5.0 4.8 4.0  CL 105 106  --   --  101  --   CO2 13* 13*  --   --  20  --   GLUCOSE 84 94  --   --  84  --   BUN 91* 89*  --   --  44*   --   CREATININE 11.12* 11.83*  --   --  6.64*  --   CALCIUM 9.1 9.2  --   --  8.1*  --    Liver Function Tests:  Recent Labs Lab 01/08/14 2007 01/08/14 2200 01/09/14 0319  AST 12 8 7   ALT 7 7 6   ALKPHOS 78 85 73  BILITOT <0.2* <0.2* <0.2*  PROT 7.4 7.1 6.3  ALBUMIN 2.6* 2.6* 2.4*   No results for input(s): LIPASE, AMYLASE in the last 168 hours. No results for input(s): AMMONIA in the last 168 hours.   CBC:  Recent Labs Lab 01/08/14 2007 01/09/14 0319  WBC 10.9* 11.4*  NEUTROABS 8.0*  --   HGB 12.9 11.1*  HCT 40.4 33.6*  MCV 90.8 89.6  PLT 253 205   CBG:  Recent Labs Lab 01/08/14 2210 01/09/14 0817  GLUCAP 91 98    Recent Results (from the past 240 hour(s))  MRSA PCR Screening     Status: None   Collection Time: 01/08/14  8:38 PM  Result Value Ref Range Status   MRSA by PCR NEGATIVE NEGATIVE Final    Comment:        The GeneXpert MRSA Assay (FDA approved for NASAL specimens only), is one component of a comprehensive MRSA colonization surveillance program. It is not  intended to diagnose MRSA infection nor to guide or monitor treatment for MRSA infections.      Studies:  Recent x-ray studies have been reviewed in detail by the Attending Physician  Time spent :   Junious Silk, ANP [facility] Office  [phone] Pager [phone]  O-Call/Text Page:      [email or web address]      password [email or web address]  If 7PM-7AM, please contact night-coverage [email or web address] Password [facility] 01/09/2014, 11:34 AM   LOS: 1 day   I have personally examined this patient and reviewed the entire database. I have reviewed the above note, made any necessary editorial changes, and agree with its content.  Lonia Blood, MD [facility]

## 2014-01-09 NOTE — Consult Note (Signed)
PULMONARY / CRITICAL CARE MEDICINE   Name: [name] MRN: [id number] DOB: Jul 19, 1934    ADMISSION DATE:  01/08/2014 CONSULTATION DATE:  01/09/14  REFERRING MD :  Dr. Sharon Seller   CHIEF COMPLAINT:  Hypotension   INITIAL PRESENTATION: 79 y/o F admitted from [facility] on 11/24 with reports of vomiting, diarrhea & abdominal pain.  Work up found her to have acute kidney injury with life threatening hyperkalemia (no known hx of renal disease).    STUDIES:  11/24  Renal US >> no obstructive uropathy, increased renal cortical echogenicity c/w medical renal disease 11/25  CXR >> mild interstitial edema, small L effusion w L basilar atx  SIGNIFICANT EVENTS: 11/24  Tx from [facility] to [facility] for AKI in the setting of diarrhea, vomiting & abd pain.   HISTORY OF PRESENT ILLNESS:  80 y/o F with a PMH of HTN, Hypothyroidism, COPD, Seasonal Allergies and Glaucoma who was admitted from [facility] on 11/24 with reports of vomiting, diarrhea & abdominal pain.  Work up found her to have acute kidney injury with life threatening hyperkalemia (no known hx of renal disease).  On presentation, she was unable to provide medical information due to acute encephalopathy.  EKG showed peaked T Waves and sr cr > 11.  She was emergently dialyzed with improvement in sr cr / potassium.     Patient now reports feeling poorly for approximately one week prior to admission and had had diarrhea, nausea & vomiting for approximately 3 days prior to presentation, decreased UOP, & mild SOB.  Am of 11/25, she developed mild hypotension with pressures in high 80's systolic.  She was medicated with 250 ml NS with improvement in systolic BP to 120's.     PAST MEDICAL HISTORY :   has a past medical history of Hypertension; Hypothyroidism; Seasonal allergies; and Glaucoma.  has no past surgical history on file.   HOME MEDICATIONS:  Prior to Admission medications   Medication Sig Start Date End Date Taking? Authorizing  Provider  ascorbic acid (VITAMIN C) 1000 MG tablet Take 1,000 mg by mouth 3 (three) times daily.   Yes Historical Provider, MD  aspirin EC 81 MG tablet Take 81 mg by mouth daily.   Yes Historical Provider, MD  atenolol (TENORMIN) 50 MG tablet Take 50 mg by mouth daily.   Yes Historical Provider, MD  azelastine (ASTELIN) 0.1 % nasal spray Place 1 spray into both nostrils daily. Use in each nostril as directed   Yes Historical Provider, MD  Brinzolamide-Brimonidine 1-0.2 % SUSP Place 1 drop into both eyes 2 (two) times daily.   Yes Historical Provider, MD  cholecalciferol (VITAMIN D) 1000 UNITS tablet Take 1,000 Units by mouth daily.   Yes Historical Provider, MD  diltiazem (CARDIZEM) 30 MG tablet Take 30 mg by mouth 3 (three) times daily.   Yes Historical Provider, MD  Fluticasone-Salmeterol (ADVAIR DISKUS) 250-50 MCG/DOSE AEPB Inhale 1 puff into the lungs 2 (two) times daily.   Yes Historical Provider, MD  furosemide (LASIX) 20 MG tablet Take 20 mg by mouth daily.    Yes Historical Provider, MD  Garlic 1000 MG CAPS Take 1 capsule by mouth daily.   Yes Historical Provider, MD  latanoprost (XALATAN) 0.005 % ophthalmic solution Place 1 drop into both eyes at bedtime.   Yes Historical Provider, MD  levothyroxine (SYNTHROID,g LEVOTHROID) 25 MCG tablet Take 25 mcg by mouth daily before breakfast.   Yes Historical Provider, MD  loratadine (CLARITIN) 10 MG tablet Take 10 mg  by mouth daily.   Yes Historical Provider, MD  LUTEIN-ZEAXANTHIN PO Take 1 tablet by mouth daily.   Yes Historical Provider, MD  Multiple Vitamins-Minerals (MULTIVITAMIN WITH MINERALS) tablet Take 1 tablet by mouth daily.   Yes Historical Provider, MD  Omega-3 Fatty Acids (FISH OIL) 1000 MG CAPS Take 1 capsule by mouth daily.   Yes Historical Provider, MD  omeprazole (PRILOSEC) 40 MG capsule Take 40 mg by mouth daily.   Yes Historical Provider, MD  pilocarpine (PILOCAR) 1 % ophthalmic solution Place 1 drop into the right eye 4 (four)  times daily.   Yes Historical Provider, MD  PROAIR HFA 108 (90 BASE) MCG/ACT inhaler Inhale 2 puffs into the lungs every 6 (six) hours as needed for wheezing or shortness of breath.  11/20/13  Yes Historical Provider, MD   Allergies  Allergen Reactions  . Ciprofloxacin Nausea And Vomiting    FAMILY HISTORY:  has no family status information on file.    SOCIAL HISTORY:  reports that she has quit smoking. She does not have any smokeless tobacco history on file.  REVIEW OF SYSTEMS:   Gen: Denies fever, chills, weight change, fatigue, night sweats HEENT: Denies blurred vision, double vision, hearing loss, tinnitus, sinus congestion, rhinorrhea, sore throat, neck stiffness, dysphagia PULM: Denies cough, sputum production, hemoptysis, wheezing.  + mild SOB prior to admit.  CV: Denies chest pain, edema, orthopnea, paroxysmal nocturnal dyspnea, palpitations GI: Denies hematochezia, melena, constipation, change in bowel habits.  +  abdominal pain, nausea, vomiting, diarrhea GU: Denies dysuria, hematuria, polyuria, oliguria, urethral discharge Endocrine: Denies hot or cold intolerance, polyuria, polyphagia or appetite change Derm: Denies rash, dry skin, scaling or peeling skin change Heme: Denies easy bruising, bleeding, bleeding gums Neuro: Denies headache, numbness, weakness, slurred speech, loss of memory or consciousness   SUBJECTIVE:   VITAL SIGNS: Temp:  [98 F (36.7 C)-99 F (37.2 C)] 99 F (37.2 C) (11/25 0829) Pulse Rate:  [61-96] 96 (11/25 0829) Resp:  [17-28] 18 (11/25 0829) BP: (87-142)/(38-81) 109/47 mmHg (11/25 0829) SpO2:  [88 %-100 %] 100 % (11/25 0829) Weight:  [204 lb 14.7 oz (92.95 kg)-210 lb 5.1 oz (95.4 kg)] 210 lb 5.1 oz (95.4 kg) (11/25 0256)   HEMODYNAMICS:    VENTILATOR SETTINGS:    INTAKE / OUTPUT:  Intake/Output Summary (Last 24 hours) at 01/09/14 1159 Last data filed at 01/09/14 1105  Gross per 24 hour  Intake 934.67 ml  Output    100 ml  Net  834.67 ml    PHYSICAL EXAMINATION: General:  wdwn adult female in NAD  Neuro:  Awake/alert, MAE, generalized weakness  HEENT:  Mm pink/moist, no jvd Cardiovascular:  s1s2 reg, few PVC's noted on monitor  Lungs:  resp's even/non-labored on 2L per [place], lungs bilaterally with few scattered crackles  Abdomen:  Soft, NTND, bsx4 active  Musculoskeletal:  No acute deformities  Skin:  Warm/dry, no edema   LABS:  CBC  Recent Labs Lab 01/08/14 2007 01/09/14 0319  WBC 10.9* 11.4*  HGB 12.9 11.1*  HCT 40.4 33.6*  PLT 253 205   Coag's No results for input(s): APTT, INR in the last 168 hours.   BMET  Recent Labs Lab 01/08/14 2007 01/08/14 2200  01/09/14 0104 01/09/14 0319 01/09/14 0350  NA 137 139  --   --  140  --   K >7.7* >7.7*  < > 5.0 4.8 4.0  CL 105 106  --   --  101  --   CO2  13* 13*  --   --  20  --   BUN 91* 89*  --   --  44*  --   CREATININE 11.12* 11.83*  --   --  6.64*  --   GLUCOSE 84 94  --   --  84  --   < > = values in this interval not displayed.   Electrolytes  Recent Labs Lab 01/08/14 2007 01/08/14 2200 01/09/14 0319  CALCIUM 9.1 9.2 8.1*   Sepsis Markers  Recent Labs Lab 01/09/14 0500  LATICACIDVEN 1.5   ABG  Recent Labs Lab 01/08/14 2242  PHART 7.116*  PCO2ART 41.9  PO2ART 112.0*   Liver Enzymes  Recent Labs Lab 01/08/14 2007 01/08/14 2200 01/09/14 0319  AST 12 8 7   ALT 7 7 6   ALKPHOS 78 85 73  BILITOT <0.2* <0.2* <0.2*  ALBUMIN 2.6* 2.6* 2.4*   Cardiac Enzymes No results for input(s): TROPONINI, PROBNP in the last 168 hours.   Glucose  Recent Labs Lab 01/08/14 2210 01/09/14 0817  GLUCAP 91 98    Imaging [place] Renal  01/08/2014   CLINICAL DATA:  Acute renal failure  EXAM: RENAL/URINARY TRACT ULTRASOUND COMPLETE  COMPARISON:  None.  FINDINGS: Right Kidney:  Length: 15 cm. Increased renal cortical echogenicity. No mass or hydronephrosis visualized.  Left Kidney:  Length: 12.3 cm. Increased renal cortical  echogenicity. No mass or hydronephrosis visualized.  Bladder:  Appears normal for degree of bladder distention.  IMPRESSION: 1. No obstructive uropathy. 2. Increased renal cortical echogenicity as can be seen with medical renal disease.   Electronically Signed   By: Elige KoHetal  Patel   On: 01/08/2014 21:15   Dg Chest Port 1 View  01/08/2014   CLINICAL DATA:  Evaluate central line placement.  EXAM: PORTABLE CHEST - 1 VIEW  COMPARISON:  01/08/2014  FINDINGS: There is a right IJ catheter with tip in the projection of the SVC. No pneumothorax identified. Mild cardiac enlargement. There is calcified plaque identified within the aortic arch. Pulmonary vascular congestion is noted. There is bibasilar atelectasis.  IMPRESSION: 1. Tip of the IJ catheter is in the SVC.  No pneumothorax. 2. Cardiac enlargement, atherosclerotic disease and pulmonary vascular congestion.   Electronically Signed   By: Signa Kellaylor  Stroud M.D.   On: 01/08/2014 23:52     ASSESSMENT / PLAN:  PULMONARY OETT  A: Small L Pleural Effusion  L Basilar Atelectasis  COPD increasing edema? P:   Pulmonary Hygiene:  Incentive spirometry, mobilize as tolerated Oxygen to support sats 88-95% Duoneb + PRN Albuterol  Would repeat abg , some clearance acidosis likley after HD, but now increasing edema?  CARDIOVASCULAR CVL A:  PVC's  Peaked T's - resolved on EKG 11/25  Hypotension - brief, responded to 250 ml 11/25  P:  Monitor BP trend  Assess cortisol, 2D ECHO Negative Lactate reassuring of perfusion  Assess cuff on other ext  RENAL A:   Acute Renal Failure - in setting of volume depletion / gastroenteritis.  Renal US negative.   Hyperkalemia  P:   Nephrology following  If BP will not tolerate HD, may require tx to ICU for CVVHD If ccvhd decided - to icu PCCM service Trend BMP  GASTROINTESTINAL A:   Nausea / Vomiting  Diarrhea  P:   PRN Zofran  Follow stool studies, see ID  Diet as tolerated  MVI  HEMATOLOGIC A:    Anemia  P:  Monitor CBC DVT: heparin sq  INFECTIOUS A:  Diarrhea - rule out infectious etiology, has slowed since admission. R/op enteric pathogen P:   BCx2 11/24 >>  UA  11/24 >> few bacteria, 11-20 wbc's C-diff 11/25 >>   Abx: Rocephin (empiric, urine), start date 11/24, day 2/x Trend PCT to narrow abx Consider stool assessment, culture, [id number] etc Low threshold quinolone / flagy  ENDOCRINE A:   Hypothyroidism - TSH wnl on admission  P:   Continue IV synthroid   NEUROLOGIC A:   Acute Encephalopathy - resolving  P:   RASS goal: n/a Supportive care PT efforts as tolerated  abg   FAMILY  - Updates: no family available 11/25 am at time of consult      Canary Brim, NP-C [facility] Pgr: [phone] or [phone]  01/09/2014, 11:59 AM   STAFF NOTE: Cindi Carbon, MD FACP have personally reviewed patient's available data, including medical history, events of note, physical examination and test results as part of my evaluation. I have discussed with resident/NP and other care providers such as pharmacist, RN and RRT. In addition, I personally evaluated patient and elicited key findings of: abg, if cvvhd needed then to icu and will take over, lethargic but arousalable, coarse, d/w renal, cortisol, bolus further, consider cvp, lactic acid reviewed  Mcarthur Rossetti. Tyson Alias, MD, FACP Pgr: [phone] [facility] 01/09/2014 1:04 PM

## 2014-01-09 NOTE — Progress Notes (Signed)
[facility] ROUNDING NOTE   Subjective:   Interval History: [place] [facility] Underwent urgent dialysis 1/24 for life threatening hyperkalemia  , Urine sediment some proteinuria  Renal ultrasound large kidneys  Objective:  Vital signs in last 24 hours:  Temp:  [98 F (36.7 C)-99 F (37.2 C)] 99 F (37.2 C) (11/25 0829) Pulse Rate:  [61-96] 96 (11/25 0829) Resp:  [17-28] 18 (11/25 0829) BP: (87-142)/(38-81) 109/47 mmHg (11/25 0829) SpO2:  [88 %-100 %] 100 % (11/25 0829) Weight:  [92.95 kg (204 lb 14.7 oz)-95.4 kg (210 lb 5.1 oz)] 95.4 kg (210 lb 5.1 oz) (11/25 0256)  Weight change:  Filed Weights   01/08/14 1903 01/08/14 2230 01/09/14 0256  Weight: 93.214 kg (205 lb 8 oz) 92.95 kg (204 lb 14.7 oz) 95.4 kg (210 lb 5.1 oz)    Intake/Output: I/O last 3 completed shifts: In: 831.7 [I.V.:831.7] Out: 100 [Urine:300]   Intake/Output this shift:  Total I/O In: 100 [I.V.:100] Out: -   CVS- RRR RS- CTA  Diminished at bases  ABD- BS present soft non-distended EXT- no edema   Basic Metabolic Panel:  Recent Labs Lab 01/08/14 2007 01/08/14 2200 01/09/14 0001 01/09/14 0104 01/09/14 0319 01/09/14 0350  NA 137 139  --   --  140  --   K >7.7* >7.7* 7.5* 5.0 4.8 4.0  CL 105 106  --   --  101  --   CO2 13* 13*  --   --  20  --   GLUCOSE 84 94  --   --  84  --   BUN 91* 89*  --   --  44*  --   CREATININE 11.12* 11.83*  --   --  6.64*  --   CALCIUM 9.1 9.2  --   --  8.1*  --     Liver Function Tests:  Recent Labs Lab 01/08/14 2007 01/08/14 2200 01/09/14 0319  AST 12 8 7   ALT 7 7 6   ALKPHOS 78 85 73  BILITOT <0.2* <0.2* <0.2*  PROT 7.4 7.1 6.3  ALBUMIN 2.6* 2.6* 2.4*   No results for input(s): LIPASE, AMYLASE in the last 168 hours. No results for input(s): AMMONIA in the last 168 hours.  CBC:  Recent Labs Lab 01/08/14 2007 01/09/14 0319  WBC 10.9* 11.4*  NEUTROABS 8.0*  --   HGB 12.9 11.1*  HCT 40.4 33.6*  MCV 90.8 89.6  PLT 253  205    Cardiac Enzymes: No results for input(s): CKTOTAL, CKMB, CKMBINDEX, TROPONINI in the last 168 hours.  BNP: Invalid input(s): POCBNP  CBG:  Recent Labs Lab 01/08/14 2210 01/09/14 0817  GLUCAP 91 98    Microbiology: Results for orders placed or performed during the hospital encounter of 01/08/14  MRSA PCR Screening     Status: None   Collection Time: 01/08/14  8:38 PM  Result Value Ref Range Status   MRSA by PCR NEGATIVE NEGATIVE Final    Comment:        The GeneXpert MRSA Assay (FDA approved for NASAL specimens only), is one component of a comprehensive MRSA colonization surveillance program. It is not intended to diagnose MRSA infection nor to guide or monitor treatment for MRSA infections.     Coagulation Studies: No results for input(s): LABPROT, INR in the last 72 hours.  Urinalysis:  Recent Labs  01/08/14 2228  COLORURINE YELLOW  LABSPEC 1.009  PHURINE 7.0  GLUCOSEU NEGATIVE  HGBUR SMALL*  BILIRUBINUR NEGATIVE  [name] AtlasKETONESUR  15*  PROTEINUR 100*  UROBILINOGEN 0.2  NITRITE NEGATIVE  LEUKOCYTESUR TRACE*      Imaging: [place] Renal  01/08/2014   CLINICAL DATA:  Acute renal failure  EXAM: RENAL/URINARY TRACT ULTRASOUND COMPLETE  COMPARISON:  None.  FINDINGS: Right Kidney:  Length: 15 cm. Increased renal cortical echogenicity. No mass or hydronephrosis visualized.  Left Kidney:  Length: 12.3 cm. Increased renal cortical echogenicity. No mass or hydronephrosis visualized.  Bladder:  Appears normal for degree of bladder distention.  IMPRESSION: 1. No obstructive uropathy. 2. Increased renal cortical echogenicity as can be seen with medical renal disease.   Electronically Signed   By: Elige KoHetal  Patel   On: 01/08/2014 21:15   Dg Chest Port 1 View  01/09/2014   CLINICAL DATA:  Hypoxia  EXAM: PORTABLE CHEST - 1 VIEW  COMPARISON:  01/08/2014  FINDINGS: Cardiomediastinal silhouette is stable. Mild elevation of the left hemidiaphragm. There is central mild vascular  congestion and mild interstitial prominence suspicious for mild interstitial edema. Small left pleural effusion with left basilar atelectasis or infiltrate. Right IJ sheath is unchanged in position.  IMPRESSION: Mild vascular congestion mild interstitial prominence suspicious for mild interstitial edema. Small left pleural effusion with left basilar atelectasis or infiltrate.   Electronically Signed   By: Natasha MeadLiviu  Pop M.D.   On: 01/09/2014 10:45   Dg Chest Port 1 View  01/08/2014   CLINICAL DATA:  Evaluate central line placement.  EXAM: PORTABLE CHEST - 1 VIEW  COMPARISON:  01/08/2014  FINDINGS: There is a right IJ catheter with tip in the projection of the SVC. No pneumothorax identified. Mild cardiac enlargement. There is calcified plaque identified within the aortic arch. Pulmonary vascular congestion is noted. There is bibasilar atelectasis.  IMPRESSION: 1. Tip of the IJ catheter is in the SVC.  No pneumothorax. 2. Cardiac enlargement, atherosclerotic disease and pulmonary vascular congestion.   Electronically Signed   By: Signa Kellaylor  Stroud M.D.   On: 01/08/2014 23:52     Medications:   . sodium chloride 100 mL/hr at 01/09/14 0700   . cefTRIAXone (ROCEPHIN)  IV  1 g Intravenous Q24H  . folic acid  1 mg Oral Daily  . heparin  5,000 Units Subcutaneous 3 times per day  . ipratropium-albuterol  3 mL Nebulization TID  . multivitamin with minerals  1 tablet Oral Daily  . sodium chloride  3 mL Intravenous Q12H  . sodium chloride  3 mL Intravenous Q12H  . thiamine  100 mg Oral Daily   sodium chloride, sodium chloride, sodium chloride, acetaminophen **OR** acetaminophen, heparin, ondansetron **OR** ondansetron (ZOFRAN) IV, oxyCODONE, sodium chloride  Assessment/ Plan:   Acute renal failure  There appears to be large kidneys, a sediment that is not remarkable. There is nothing in the history to suggest preceeding ill health. Although she does have some gastroenteritis. Serological evaluation is  underway  Hypotension  I would recommend cortisol and 2 D Echo  Anemia mild  This appears acute with low BP and shock-- a 2 D Echo would be helpful  LOS: 1 [name] @TODAY @10 :58 AM

## 2014-01-09 NOTE — Progress Notes (Signed)
 bolus started per NP order.

## 2014-01-09 NOTE — Progress Notes (Signed)
*  PRELIMINARY RESULTS* Echocardiogram 2D Echocardiogram has been performed.  Jeryl ColumbiaLLIOTT, Sheikh Leverich 01/09/2014, 2:33 PM

## 2014-01-09 NOTE — Progress Notes (Signed)
Pts BP slowly dropping since admission. Currently 88/38 on admission systolic in the 140s. NP Craige CottaKirby notified.

## 2014-01-10 DIAGNOSIS — E872 Acidosis, unspecified: Secondary | ICD-10-CM | POA: Diagnosis present

## 2014-01-10 DIAGNOSIS — G9340 Encephalopathy, unspecified: Secondary | ICD-10-CM

## 2014-01-10 DIAGNOSIS — E038 Other specified hypothyroidism: Secondary | ICD-10-CM | POA: Insufficient documentation

## 2014-01-10 DIAGNOSIS — J438 Other emphysema: Secondary | ICD-10-CM

## 2014-01-10 DIAGNOSIS — E785 Hyperlipidemia, unspecified: Secondary | ICD-10-CM | POA: Insufficient documentation

## 2014-01-10 DIAGNOSIS — E8729 Other acidosis: Secondary | ICD-10-CM | POA: Insufficient documentation

## 2014-01-10 DIAGNOSIS — I9589 Other hypotension: Secondary | ICD-10-CM

## 2014-01-10 LAB — CBC WITH DIFFERENTIAL/PLATELET
BASOS ABS: 0.1 10*3/uL (ref 0.0–0.1)
BASOS PCT: 1 % (ref 0–1)
EOS ABS: 1.1 10*3/uL — AB (ref 0.0–0.7)
EOS PCT: 12 % — AB (ref 0–5)
HCT: 30.7 % — ABNORMAL LOW (ref 36.0–46.0)
Hemoglobin: 10.1 g/dL — ABNORMAL LOW (ref 12.0–15.0)
LYMPHS PCT: 18 % (ref 12–46)
Lymphs Abs: 1.6 10*3/uL (ref 0.7–4.0)
MCH: 30 pg (ref 26.0–34.0)
MCHC: 32.9 g/dL (ref 30.0–36.0)
MCV: 91.1 fL (ref 78.0–100.0)
Monocytes Absolute: 1.2 10*3/uL — ABNORMAL HIGH (ref 0.1–1.0)
Monocytes Relative: 13 % — ABNORMAL HIGH (ref 3–12)
Neutro Abs: 5.2 10*3/uL (ref 1.7–7.7)
Neutrophils Relative %: 56 % (ref 43–77)
PLATELETS: 223 10*3/uL (ref 150–400)
RBC: 3.37 MIL/uL — ABNORMAL LOW (ref 3.87–5.11)
RDW: 14.4 % (ref 11.5–15.5)
WBC: 9.1 10*3/uL (ref 4.0–10.5)

## 2014-01-10 LAB — URINE CULTURE
Colony Count: NO GROWTH
Culture: NO GROWTH

## 2014-01-10 LAB — CORTISOL: CORTISOL PLASMA: 17.6 ug/dL

## 2014-01-10 LAB — GLUCOSE, CAPILLARY: Glucose-Capillary: 102 mg/dL — ABNORMAL HIGH (ref 70–99)

## 2014-01-10 LAB — COMPREHENSIVE METABOLIC PANEL
ALK PHOS: 67 U/L (ref 39–117)
ALT: 6 U/L (ref 0–35)
AST: 6 U/L (ref 0–37)
Albumin: 2.1 g/dL — ABNORMAL LOW (ref 3.5–5.2)
Anion gap: 16 — ABNORMAL HIGH (ref 5–15)
BUN: 49 mg/dL — AB (ref 6–23)
CHLORIDE: 104 meq/L (ref 96–112)
CO2: 21 mEq/L (ref 19–32)
CREATININE: 8.99 mg/dL — AB (ref 0.50–1.10)
Calcium: 7.3 mg/dL — ABNORMAL LOW (ref 8.4–10.5)
GFR calc Af Amer: 4 mL/min — ABNORMAL LOW (ref >90)
GFR calc non Af Amer: 4 mL/min — ABNORMAL LOW (ref >90)
Glucose, Bld: 88 mg/dL (ref 70–99)
Potassium: 4.2 mEq/L (ref 3.7–5.3)
Sodium: 141 mEq/L (ref 137–147)
Total Bilirubin: <0.2 mg/dL — ABNORMAL LOW (ref 0.3–1.2)
Total Protein: 6.1 g/dL (ref 6.0–8.3)

## 2014-01-10 LAB — LIPASE, BLOOD: LIPASE: 19 U/L (ref 11–59)

## 2014-01-10 MED ORDER — CETYLPYRIDINIUM CHLORIDE 0.05 % MT LIQD
7.0000 mL | Freq: Two times a day (BID) | OROMUCOSAL | Status: DC
  Administered 2014-01-10 – 2014-01-21 (×23): 7 mL via OROMUCOSAL

## 2014-01-10 NOTE — Progress Notes (Addendum)
[facility] TEAM 1 - Stepdown/ICU TEAM Progress Note  [name] ZOX:[id number] DOB: 11-23-1934 DOA: 01/08/2014 PCP: No primary care provider on file.  Admit HPI / Brief Narrative: [name] is a 79 y.o. WF  presents as a transfer from [place]. Not a reliable historian and no family is present. PMHx COPD (home O2 2 L via [place]) presented to the ED with complaints of diarrhea (chronic), HTN, hypothyroidism, HTN, HLD. She states that this has been going on for a while. She states that she has had some abdominal pain associated. She also notes decreased urine output. She admits to having shortness of breath, She has no prior history of kidney disease. At [facility] she was noted to have hyperkalemia with a potassium of 9 this was aggressively treated and came down minimally to a final reading of 8 prior to transfer. Patient also was noted to have a creatinine of 14 and therefore she was transferred to [facility] for further management.    HPI/Subjective: 11/26  A/O 4, some mild confusion (does not recall HD last night), negative pain on urination, negative CVA tenderness.  Assessment/Plan: Acute renal Failure -Patient requiring HD session today per nephrology -Patient requires urgent HD; admission creatinine= 11.83>>6.64>>8.99  Severe Hyperkalemia -Resolved with HD and Kayexalate  -Monitor closely  Respiratory acidosis  -Most likely secondary to her renal failure resolving  -check ABG in the a.m.  -Patient also with small pleural effusion and atelectasis -Flutter valve q 4hr  COPD(home O2 2 L via [place]) -Titrate O2 to maintain SPO2 89-93%  -Continue DuoNeb nebs   Systolic and diastolic CHF -Secondary to patient's relative hypotension will hold BP medication (beta blocker, Cardizem, Lasix)   Hypertension -See systolic and diastolic CHF  Gastroenteritis -Resolved   Diarrhea -C. difficile negative  Hypothyroid -TSH within normal limit -Continue Synthroid 25 g  daily  Hyperlipidemia -Obtain lipid panel    Code Status: FULL Family Communication: no family present at time of exam Disposition Plan: Resolution of renal failure/encephalopathy    Consultants: Dr. Elvis Coil (nephrology) Dr. Shan Levans [facility] M)   Procedure/Significant Events: 11/24 Tx from [facility] to [facility] for AKI in the setting of diarrhea, vomiting & abd pain.  11/24 Renal US >> no obstructive uropathy, increased renal cortical echogenicity c/w medical renal disease 11/25 CXR >> mild interstitial edema, small L effusion w L basilar atelectasis  11/25 echocardiogram;- Left ventricle: mild LVH. LVEF= 55%. - (grade 1diastolic dysfunction).   Culture 11/24 blood left antecubital right IV NGTD  UC 11/24 >> NGTD C-diff 11/25 >> NEG   Antibiotics: Ceftriaxone 11/24>>   DVT prophylaxis: Heparin subcutaneous   Devices    LINES / TUBES:      Continuous Infusions:   Objective: VITAL SIGNS: Temp: 98.5 F (36.9 C) (11/26 0802) Temp Source: Oral (11/26 0802) BP: 122/74 mmHg (11/26 0802) Pulse Rate: 99 (11/26 0802) SPO2; FIO2:   Intake/Output Summary (Last 24 hours) at 01/10/14 1102 Last data filed at 01/10/14 1000  Gross per 24 hour  Intake   2023 ml  Output     50 ml  Net   1973 ml     Exam: General: A/O 4, some mild confusion (does not recall HD last night), No acute respiratory distress Lungs: Clear to auscultation bilaterally without wheezes or crackles Cardiovascular: Regular rate and rhythm without murmur gallop or rub normal S1 and S2 Abdomen: Nontender, nondistended, negative CVA tenderness, soft, bowel sounds positive, no rebound, no ascites, no appreciable mass Extremities: No significant cyanosis, clubbing,  or edema bilateral lower extremities  Data Reviewed: Basic Metabolic Panel:  Recent Labs Lab 01/08/14 2007 01/08/14 2200 01/09/14 0001 01/09/14 0104 01/09/14 0319 01/09/14 0350 01/10/14 0500  NA 137 139  --   --   140  --  141  K >7.7* >7.7* 7.5* 5.0 4.8 4.0 4.2  CL 105 106  --   --  101  --  104  CO2 13* 13*  --   --  20  --  21  GLUCOSE 84 94  --   --  84  --  88  BUN 91* 89*  --   --  44*  --  49*  CREATININE 11.12* 11.83*  --   --  6.64*  --  8.99*  CALCIUM 9.1 9.2  --   --  8.1*  --  7.3*   Liver Function Tests:  Recent Labs Lab 01/08/14 2007 01/08/14 2200 01/09/14 0319 01/10/14 0500  AST 12 8 7 6   ALT 7 7 6 6   ALKPHOS 78 85 73 67  BILITOT <0.2* <0.2* <0.2* <0.2*  PROT 7.4 7.1 6.3 6.1  ALBUMIN 2.6* 2.6* 2.4* 2.1*    Recent Labs Lab 01/10/14 0500  LIPASE 19   No results for input(s): AMMONIA in the last 168 hours. CBC:  Recent Labs Lab 01/08/14 2007 01/09/14 0319 01/10/14 0500  WBC 10.9* 11.4* 9.1  NEUTROABS 8.0*  --  5.2  HGB 12.9 11.1* 10.1*  HCT 40.4 33.6* 30.7*  MCV 90.8 89.6 91.1  PLT 253 205 223   Cardiac Enzymes: No results for input(s): CKTOTAL, CKMB, CKMBINDEX, TROPONINI in the last 168 hours. BNP (last 3 results) No results for input(s): PROBNP in the last 8760 hours. CBG:  Recent Labs Lab 01/08/14 2210 01/09/14 0817 01/10/14 0800  GLUCAP 91 98 102*    Recent Results (from the past 240 hour(s))  MRSA PCR Screening     Status: None   Collection Time: 01/08/14  8:38 PM  Result Value Ref Range Status   MRSA by PCR NEGATIVE NEGATIVE Final    Comment:        The GeneXpert MRSA Assay (FDA approved for NASAL specimens only), is one component of a comprehensive MRSA colonization surveillance program. It is not intended to diagnose MRSA infection nor to guide or monitor treatment for MRSA infections.   Urine culture     Status: None   Collection Time: 01/08/14 10:28 PM  Result Value Ref Range Status   Specimen Description URINE, CATHETERIZED  Final   Special Requests NONE  Final   Culture  Setup Time   Final    01/08/2014 23:11 Performed at [facility]    Colony Count NO GROWTH Performed at [facility]   Final    Culture NO GROWTH Performed at [facility]   Final   Report Status 01/10/2014 FINAL  Final  Culture, blood (routine x 2)     Status: None (Preliminary result)   Collection Time: 01/09/14  1:25 PM  Result Value Ref Range Status   Specimen Description BLOOD LEFT ANTECUBITAL  Final   Special Requests BOTTLES DRAWN AEROBIC AND ANAEROBIC 10CC  Final   Culture  Setup Time   Final    01/09/2014 18:02 Performed at [facility]    Culture   Final           BLOOD CULTURE RECEIVED NO GROWTH TO DATE CULTURE WILL BE HELD FOR 5 DAYS BEFORE ISSUING A FINAL NEGATIVE REPORT Performed  at [facility]    Report Status PENDING  Incomplete  Culture, blood (routine x 2)     Status: None (Preliminary result)   Collection Time: 01/09/14  1:30 PM  Result Value Ref Range Status   Specimen Description BLOOD RIGHT IV START  Final   Special Requests BOTTLES DRAWN AEROBIC AND ANAEROBIC 10CC  Final   Culture  Setup Time   Final    01/09/2014 18:02 Performed at [facility]    Culture   Final           BLOOD CULTURE RECEIVED NO GROWTH TO DATE CULTURE WILL BE HELD FOR 5 DAYS BEFORE ISSUING A FINAL NEGATIVE REPORT Performed at [facility]    Report Status PENDING  Incomplete  Clostridium Difficile by PCR     Status: None   Collection Time: 01/09/14  1:50 PM  Result Value Ref Range Status   C difficile by pcr NEGATIVE NEGATIVE Final     Studies:  Recent x-ray studies have been reviewed in detail by the Attending Physician  Scheduled Meds:  Scheduled Meds: . antiseptic oral rinse  7 mL Mouth Rinse BID  . aspirin EC  81 mg Oral Daily  . cefTRIAXone (ROCEPHIN)  IV  1 g Intravenous Q24H  . folic acid  1 mg Oral Daily  . heparin  5,000 Units Subcutaneous 3 times per day  . ipratropium-albuterol  3 mL Nebulization TID  . latanoprost  1 drop Both Eyes QHS  . levothyroxine  25 mcg Oral QAC breakfast  . multivitamin with minerals  1 tablet Oral Daily  . pilocarpine   1 drop Right Eye QID  . thiamine  100 mg Oral Daily    Time spent on care of this patient: 40 mins   Drema DallasWOODS, CURTIS, J , MD   [facility] Office  [phone] Pager [phone]  On-Call/Text Page:      [email or web address]      password [email or web address]  If 7PM-7AM, please contact night-coverage [email or web address] Password [email or web address] 01/10/2014, 11:02 AM   LOS: 2 days

## 2014-01-10 NOTE — Progress Notes (Signed)
[facility] ROUNDING NOTE   Subjective:   Interval History:  Appears to be a little better .   Objective:  Vital signs in last 24 hours:  Temp:  [97.1 F (36.2 C)-99 F (37.2 C)] 98.4 F (36.9 C) (11/26 0358) Pulse Rate:  [86-101] 101 (11/26 0358) Resp:  [18-27] 25 (11/26 0358) BP: (90-124)/(41-79) 115/79 mmHg (11/26 0358) SpO2:  [95 %-100 %] 95 % (11/26 0358) Weight:  [96.4 kg (212 lb 8.4 oz)] 96.4 kg (212 lb 8.4 oz) (11/26 0358)  Weight change: 3.186 kg (7 lb 0.4 oz) Filed Weights   01/08/14 2230 01/09/14 0256 01/10/14 0358  Weight: 92.95 kg (204 lb 14.7 oz) 95.4 kg (210 lb 5.1 oz) 96.4 kg (212 lb 8.4 oz)    Intake/Output: I/O last 3 completed shifts: In: 2734.7 [I.V.:2734.7] Out: 120 [Urine:320]   Intake/Output this shift:     CVS- RRR RS- CTA using oxygen diminnshed air entry ABD- BS present soft non-distended EXT- 2 + edema   Basic Metabolic Panel:  Recent Labs Lab 01/08/14 2007 01/08/14 2200 01/09/14 0001 01/09/14 0104 01/09/14 0319 01/09/14 0350 01/10/14 0500  NA 137 139  --   --  140  --  141  K >7.7* >7.7* 7.5* 5.0 4.8 4.0 4.2  CL 105 106  --   --  101  --  104  CO2 13* 13*  --   --  20  --  21  GLUCOSE 84 94  --   --  84  --  88  BUN 91* 89*  --   --  44*  --  49*  CREATININE 11.12* 11.83*  --   --  6.64*  --  8.99*  CALCIUM 9.1 9.2  --   --  8.1*  --  7.3*    Liver Function Tests:  Recent Labs Lab 01/08/14 2007 01/08/14 2200 01/09/14 0319 01/10/14 0500  AST 12 8 7 6   ALT 7 7 6 6   ALKPHOS 78 85 73 67  BILITOT <0.2* <0.2* <0.2* <0.2*  PROT 7.4 7.1 6.3 6.1  ALBUMIN 2.6* 2.6* 2.4* 2.1*    Recent Labs Lab 01/10/14 0500  LIPASE 19   No results for input(s): AMMONIA in the last 168 hours.  CBC:  Recent Labs Lab 01/08/14 2007 01/09/14 0319 01/10/14 0500  WBC 10.9* 11.4* 9.1  NEUTROABS 8.0*  --  5.2  HGB 12.9 11.1* 10.1*  HCT 40.4 33.6* 30.7*  MCV 90.8 89.6 91.1  PLT 253 205 223    Cardiac Enzymes: No  results for input(s): CKTOTAL, CKMB, CKMBINDEX, TROPONINI in the last 168 hours.  BNP: Invalid input(s): POCBNP  CBG:  Recent Labs Lab 01/08/14 2210 01/09/14 0817  GLUCAP 91 98    Microbiology: Results for orders placed or performed during the hospital encounter of 01/08/14  MRSA PCR Screening     Status: None   Collection Time: 01/08/14  8:38 PM  Result Value Ref Range Status   MRSA by PCR NEGATIVE NEGATIVE Final    Comment:        The GeneXpert MRSA Assay (FDA approved for NASAL specimens only), is one component of a comprehensive MRSA colonization surveillance program. It is not intended to diagnose MRSA infection nor to guide or monitor treatment for MRSA infections.   Urine culture     Status: None   Collection Time: 01/08/14 10:28 PM  Result Value Ref Range Status   Specimen Description URINE, CATHETERIZED  Final   Special Requests NONE  Final   Culture  Setup Time   Final    01/08/2014 23:11 Performed at [facility]    Colony Count NO GROWTH Performed at [facility]   Final   Culture NO GROWTH Performed at [facility]   Final   Report Status 01/10/2014 FINAL  Final  Clostridium Difficile by PCR     Status: None   Collection Time: 01/09/14  1:50 PM  Result Value Ref Range Status   C difficile by pcr NEGATIVE NEGATIVE Final    Coagulation Studies: No results for input(s): LABPROT, INR in the last 72 hours.  Urinalysis:  Recent Labs  01/08/14 2228  COLORURINE YELLOW  LABSPEC 1.009  PHURINE 7.0  GLUCOSEU NEGATIVE  HGBUR SMALL*  BILIRUBINUR NEGATIVE  KETONESUR 15*  PROTEINUR 100*  UROBILINOGEN 0.2  NITRITE NEGATIVE  LEUKOCYTESUR TRACE*      Imaging: [place] Renal  01/08/2014   CLINICAL DATA:  Acute renal failure  EXAM: RENAL/URINARY TRACT ULTRASOUND COMPLETE  COMPARISON:  None.  FINDINGS: Right Kidney:  Length: 15 cm. Increased renal cortical echogenicity. No mass or hydronephrosis visualized.  Left Kidney:  Length:  12.3 cm. Increased renal cortical echogenicity. No mass or hydronephrosis visualized.  Bladder:  Appears normal for degree of bladder distention.  IMPRESSION: 1. No obstructive uropathy. 2. Increased renal cortical echogenicity as can be seen with medical renal disease.   Electronically Signed   By: Elige KoHetal  Patel   On: 01/08/2014 21:15   Dg Chest Port 1 View  01/09/2014   CLINICAL DATA:  Hypoxia  EXAM: PORTABLE CHEST - 1 VIEW  COMPARISON:  01/08/2014  FINDINGS: Cardiomediastinal silhouette is stable. Mild elevation of the left hemidiaphragm. There is central mild vascular congestion and mild interstitial prominence suspicious for mild interstitial edema. Small left pleural effusion with left basilar atelectasis or infiltrate. Right IJ sheath is unchanged in position.  IMPRESSION: Mild vascular congestion mild interstitial prominence suspicious for mild interstitial edema. Small left pleural effusion with left basilar atelectasis or infiltrate.   Electronically Signed   By: Natasha MeadLiviu  Pop M.D.   On: 01/09/2014 10:45   Dg Chest Port 1 View  01/08/2014   CLINICAL DATA:  Evaluate central line placement.  EXAM: PORTABLE CHEST - 1 VIEW  COMPARISON:  01/08/2014  FINDINGS: There is a right IJ catheter with tip in the projection of the SVC. No pneumothorax identified. Mild cardiac enlargement. There is calcified plaque identified within the aortic arch. Pulmonary vascular congestion is noted. There is bibasilar atelectasis.  IMPRESSION: 1. Tip of the IJ catheter is in the SVC.  No pneumothorax. 2. Cardiac enlargement, atherosclerotic disease and pulmonary vascular congestion.   Electronically Signed   By: Signa Kellaylor  Stroud M.D.   On: 01/08/2014 23:52     Medications:     . antiseptic oral rinse  7 mL Mouth Rinse BID  . aspirin EC  81 mg Oral Daily  . cefTRIAXone (ROCEPHIN)  IV  1 g Intravenous Q24H  . folic acid  1 mg Oral Daily  . heparin  5,000 Units Subcutaneous 3 times per day  . ipratropium-albuterol  3 mL  Nebulization TID  . latanoprost  1 drop Both Eyes QHS  . levothyroxine  25 mcg Oral QAC breakfast  . multivitamin with minerals  1 tablet Oral Daily  . pilocarpine  1 drop Right Eye QID  . thiamine  100 mg Oral Daily   sodium chloride, sodium chloride, acetaminophen **OR** acetaminophen, heparin, ondansetron **OR** ondansetron (ZOFRAN) IV  Assessment/ Plan:   Acute renal failure with hyperkalemia requiring emergent dialysis 11/24 awaiting SPEP and UPEP  Renal U/S normal size kidneys    Hypotension some what better  Anemia above 10  Interesting situation in patient with poor functional status at baseline secondary to COPD  Presented with nausea and vomiting and probably volume depletion. Her BP has responded to fluid boluses although she is still oliguric. This could be CKD with progression or ATN from volume depletion.   LOS: 2 [name] W @TODAY @8 :04 AM

## 2014-01-10 NOTE — Progress Notes (Addendum)
PULMONARY / CRITICAL CARE MEDICINE   Name: [name] MRN: [id number] DOB: 1934/06/07    ADMISSION DATE:  01/08/2014 CONSULTATION DATE:  01/09/14  REFERRING MD :  Dr. Sharon SellerMcClung   CHIEF COMPLAINT:  Hypotension   INITIAL PRESENTATION: 79 y/o F [facility]24 with reports of vomiting, diarrhea & abdominal pain.  Work up found her to have acute kidney injury with life threatening hyperkalemia (no known hx of renal disease).    STUDIES:  11/24  Renal US >> no obstructive uropathy, increased renal cortical echogenicity c/w medical renal disease 11/25  CXR >> mild interstitial edema, small L effusion w L basilar atx  SIGNIFICANT EVENTS: 11/24  [facility] in the setting of diarrhea, vomiting & abd pain.    SUBJECTIVE:  C/o mild abd pain, less confused, bp better  VITAL SIGNS: Temp:  [97.1 F (36.2 C)-98.6 F (37 C)] 98.5 F (36.9 C) (11/26 0802) Pulse Rate:  [86-101] 99 (11/26 0802) Resp:  [19-27] 27 (11/26 0802) BP: (90-124)/(41-79) 122/74 mmHg (11/26 0802) SpO2:  [95 %-99 %] 98 % (11/26 0805) Weight:  [96.4 kg (212 lb 8.4 oz)] 96.4 kg (212 lb 8.4 oz) (11/26 0358)   HEMODYNAMICS: BP better       INTAKE / OUTPUT:  Intake/Output Summary (Last 24 hours) at 01/10/14 [id number] Last data filed at 01/10/14 0800  Gross per 24 hour  Intake   1803 ml  Output     70 ml  Net   1733 ml    PHYSICAL EXAMINATION: General:  wdwn adult female in NAD  Neuro:  Awake/alert, MAE, generalized weakness  HEENT:  Mm pink/moist, no jvd Cardiovascular:  s1s2 reg, few PVC's noted on monitor  Lungs:  clearer  Abdomen:  Soft, NTND, bsx4 active  Musculoskeletal:  No acute deformities  Skin:  Warm/dry, no edema   LABS:  CBC  Recent Labs Lab 01/08/14 2007 01/09/14 0319 01/10/14 0500  WBC 10.9* 11.4* 9.1  HGB 12.9 11.1* 10.1*  HCT 40.4 33.6* 30.7*  PLT 253 205 223   Coag's No results for input(s): APTT, INR in the last 168 hours.    BMET  Recent Labs Lab 01/08/14 2200  01/09/14 0319 01/09/14 0350 01/10/14 0500  NA 139  --  140  --  141  K >7.7*  < > 4.8 4.0 4.2  CL 106  --  101  --  104  CO2 13*  --  20  --  21  BUN 89*  --  44*  --  49*  CREATININE 11.83*  --  6.64*  --  8.99*  GLUCOSE 94  --  84  --  88  < > = values in this interval not displayed.   Electrolytes  Recent Labs Lab 01/08/14 2200 01/09/14 0319 01/10/14 0500  CALCIUM 9.2 8.1* 7.3*   Sepsis Markers  Recent Labs Lab 01/09/14 0500 01/09/14 1330  LATICACIDVEN 1.5  --   PROCALCITON  --  0.35   ABG  Recent Labs Lab 01/08/14 2242 01/09/14 1438  PHART 7.116* 7.302*  PCO2ART 41.9 44.0  PO2ART 112.0* 102.0*   Liver Enzymes  Recent Labs Lab 01/08/14 2200 01/09/14 0319 01/10/14 0500  AST 8 7 6   ALT 7 6 6   ALKPHOS 85 73 67  BILITOT <0.2* <0.2* <0.2*  ALBUMIN 2.6* 2.4* 2.1*   Cardiac Enzymes No results for input(s): TROPONINI, PROBNP in the last 168 hours.   Glucose  Recent Labs Lab 01/08/14  2210 01/09/14 0817  GLUCAP 91 98    Imaging Dg Chest Port 1 View  01/09/2014   CLINICAL DATA:  Hypoxia  EXAM: PORTABLE CHEST - 1 VIEW  COMPARISON:  01/08/2014  FINDINGS: Cardiomediastinal silhouette is stable. Mild elevation of the left hemidiaphragm. There is central mild vascular congestion and mild interstitial prominence suspicious for mild interstitial edema. Small left pleural effusion with left basilar atelectasis or infiltrate. Right IJ sheath is unchanged in position.  IMPRESSION: Mild vascular congestion mild interstitial prominence suspicious for mild interstitial edema. Small left pleural effusion with left basilar atelectasis or infiltrate.   Electronically Signed   By: Natasha MeadLiviu  Pop M.D.   On: 01/09/2014 10:45     ASSESSMENT / PLAN:  PULMONARY  A: Small L Pleural Effusion  L Basilar Atelectasis  COPD increasing edema? P:   Pulmonary Hygiene:  Incentive spirometry, mobilize as tolerated Oxygen to support  sats 88-95% Duoneb + PRN Albuterol    CARDIOVASCULAR  A:  PVC's  Peaked T's - resolved on EKG 11/25  Hypotension - brief, responded to 250 ml 11/25  Cortisol NORMAL P:  Monitor BP trend  Negative Lactate reassuring of perfusion   RENAL A:   RIJ HD catheter 11/25 Acute Renal Failure - in setting of volume depletion / gastroenteritis  Renal US negative.   Hyperkalemia  Improved  P:   Nephrology following and discussed wth dr webb this am, no plans for HD today. No need for CVVH. BP is better therefore keep in 2600 and hold ICU tfr   GASTROINTESTINAL A:   Nausea / Vomiting resolved  Diarrhea improved  P:   PRN Zofran  Follow stool studies, see ID  Diet as tolerated  MVI  HEMATOLOGIC A:   Anemia  P:  Monitor CBC DVT: heparin sq  INFECTIOUS A:   Diarrhea -improved, C DIFF NEG  R/o enteric pathogen P:   BCx2 11/24 >>  UC 11/24 >> NG C-diff 11/25 >> NEG PCT 11/26: NORMAL  Consider d/c of ABX with neg c/s data and NORMAL PCT  Consider stool assessment, culture, [id number] etc   ENDOCRINE A:   Hypothyroidism - TSH wnl on admission  P:   Continue IV synthroid   NEUROLOGIC A:   Acute Encephalopathy - resolved P:   Supportive care PT efforts as tolerated     FAMILY  - Updates: I updated son Mellody DanceKeith by phone 01/10/14   Dorcas CarrowPatrick WrightMD Beeper  [phone]  Cell  [phone]  If no response or cell goes to voicemail, call beeper [phone]   01/10/2014, 8:38 AM

## 2014-01-11 DIAGNOSIS — N170 Acute kidney failure with tubular necrosis: Principal | ICD-10-CM

## 2014-01-11 DIAGNOSIS — I950 Idiopathic hypotension: Secondary | ICD-10-CM

## 2014-01-11 DIAGNOSIS — J9601 Acute respiratory failure with hypoxia: Secondary | ICD-10-CM

## 2014-01-11 LAB — LIPID PANEL
CHOL/HDL RATIO: 5.9 ratio
CHOLESTEROL: 171 mg/dL (ref 0–200)
HDL: 29 mg/dL — ABNORMAL LOW (ref >39)
LDL Cholesterol: 122 mg/dL — ABNORMAL HIGH (ref 0–99)
TRIGLYCERIDES: 99 mg/dL (ref <150)
VLDL: 20 mg/dL (ref 0–40)

## 2014-01-11 LAB — CBC WITH DIFFERENTIAL/PLATELET
Basophils Absolute: 0.1 10*3/uL (ref 0.0–0.1)
Basophils Relative: 1 % (ref 0–1)
Eosinophils Absolute: 1.1 10*3/uL — ABNORMAL HIGH (ref 0.0–0.7)
Eosinophils Relative: 12 % — ABNORMAL HIGH (ref 0–5)
HEMATOCRIT: 30.9 % — AB (ref 36.0–46.0)
Hemoglobin: 9.7 g/dL — ABNORMAL LOW (ref 12.0–15.0)
Lymphocytes Relative: 15 % (ref 12–46)
Lymphs Abs: 1.3 10*3/uL (ref 0.7–4.0)
MCH: 28.4 pg (ref 26.0–34.0)
MCHC: 31.4 g/dL (ref 30.0–36.0)
MCV: 90.4 fL (ref 78.0–100.0)
Monocytes Absolute: 1.1 10*3/uL — ABNORMAL HIGH (ref 0.1–1.0)
Monocytes Relative: 12 % (ref 3–12)
NEUTROS ABS: 5.4 10*3/uL (ref 1.7–7.7)
NEUTROS PCT: 60 % (ref 43–77)
Platelets: 233 10*3/uL (ref 150–400)
RBC: 3.42 MIL/uL — AB (ref 3.87–5.11)
RDW: 14.5 % (ref 11.5–15.5)
WBC: 9 10*3/uL (ref 4.0–10.5)

## 2014-01-11 LAB — PROCALCITONIN: PROCALCITONIN: 0.39 ng/mL

## 2014-01-11 LAB — LEGIONELLA ANTIGEN, URINE

## 2014-01-11 LAB — PROTEIN ELECTROPHORESIS, SERUM
ALPHA-1-GLOBULIN: 6.9 % — AB (ref 2.9–4.9)
ALPHA-2-GLOBULIN: 15.1 % — AB (ref 7.1–11.8)
Albumin ELP: 46.5 % — ABNORMAL LOW (ref 55.8–66.1)
BETA 2: 6.9 % — AB (ref 3.2–6.5)
Beta Globulin: 5.4 % (ref 4.7–7.2)
GAMMA GLOBULIN: 19.2 % — AB (ref 11.1–18.8)
M-Spike, %: NOT DETECTED g/dL
Total Protein ELP: 6.5 g/dL (ref 6.0–8.3)

## 2014-01-11 LAB — COMPREHENSIVE METABOLIC PANEL
ALT: 6 U/L (ref 0–35)
ANION GAP: 18 — AB (ref 5–15)
AST: 8 U/L (ref 0–37)
Albumin: 2 g/dL — ABNORMAL LOW (ref 3.5–5.2)
Alkaline Phosphatase: 66 U/L (ref 39–117)
BUN: 50 mg/dL — AB (ref 6–23)
CO2: 19 mEq/L (ref 19–32)
CREATININE: 9.89 mg/dL — AB (ref 0.50–1.10)
Calcium: 7 mg/dL — ABNORMAL LOW (ref 8.4–10.5)
Chloride: 104 mEq/L (ref 96–112)
GFR calc non Af Amer: 3 mL/min — ABNORMAL LOW (ref >90)
GFR, EST AFRICAN AMERICAN: 4 mL/min — AB (ref >90)
GLUCOSE: 82 mg/dL (ref 70–99)
POTASSIUM: 4.8 meq/L (ref 3.7–5.3)
Sodium: 141 mEq/L (ref 137–147)
TOTAL PROTEIN: 6 g/dL (ref 6.0–8.3)
Total Bilirubin: <0.2 mg/dL — ABNORMAL LOW (ref 0.3–1.2)

## 2014-01-11 LAB — GLUCOSE, CAPILLARY: Glucose-Capillary: 84 mg/dL (ref 70–99)

## 2014-01-11 LAB — MAGNESIUM: Magnesium: 1.8 mg/dL (ref 1.5–2.5)

## 2014-01-11 MED ORDER — SENNOSIDES-DOCUSATE SODIUM 8.6-50 MG PO TABS
1.0000 | ORAL_TABLET | Freq: Two times a day (BID) | ORAL | Status: DC
  Administered 2014-01-11 – 2014-01-21 (×20): 1 via ORAL
  Filled 2014-01-11 (×23): qty 1

## 2014-01-11 MED ORDER — ALBUTEROL SULFATE (2.5 MG/3ML) 0.083% IN NEBU: 2.5000 mg | INHALATION_SOLUTION | RESPIRATORY_TRACT | Status: DC | PRN

## 2014-01-11 NOTE — Progress Notes (Signed)
[facility] ROUNDING NOTE   Subjective:   Interval History  Drinda Butts daughter at bedside. Urine output  Dry tongue  Left arm swollen   Objective:  Vital signs in last 24 hours:  Temp:  [97.9 F (36.6 C)-98.3 F (36.8 C)] 98.1 F (36.7 C) (11/27 0730) Pulse Rate:  [42-107] 101 (11/27 0730) Resp:  [20-26] 24 (11/27 0730) BP: (114-130)/(61-90) 130/66 mmHg (11/27 0730) SpO2:  [97 %-99 %] 97 % (11/27 0730) Weight:  [99.3 kg (218 lb 14.7 oz)] 99.3 kg (218 lb 14.7 oz) (11/27 0437)  Weight change: 2.9 kg (6 lb 6.3 oz) Filed Weights   01/09/14 0256 01/10/14 0358 01/11/14 0437  Weight: 95.4 kg (210 lb 5.1 oz) 96.4 kg (212 lb 8.4 oz) 99.3 kg (218 lb 14.7 oz)    Intake/Output: I/O last 3 completed shifts: In: 2540 [P.O.:240; I.V.:2300] Out: 300 [Urine:300]   Intake/Output this shift:     CVS- RRR RS-  Diminished breath sounds  ABD- BS present soft non-distended EXT- Left arm edema   Basic Metabolic Panel:  Recent Labs Lab 01/08/14 2007 01/08/14 2200  01/09/14 0104 01/09/14 0319 01/09/14 0350 01/10/14 0500 01/11/14 0535  NA 137 139  --   --  140  --  141 141  K >7.7* >7.7*  < > 5.0 4.8 4.0 4.2 4.8  CL 105 106  --   --  101  --  104 104  CO2 13* 13*  --   --  20  --  21 19  GLUCOSE 84 94  --   --  84  --  88 82  BUN 91* 89*  --   --  44*  --  49* 50*  CREATININE 11.12* 11.83*  --   --  6.64*  --  8.99* 9.89*  CALCIUM 9.1 9.2  --   --  8.1*  --  7.3* 7.0*  MG  --   --   --   --   --   --   --  1.8  < > = values in this interval not displayed.  Liver Function Tests:  Recent Labs Lab 01/08/14 2007 01/08/14 2200 01/09/14 0319 01/10/14 0500 01/11/14 0535  AST 12 8 7 6 8   ALT 7 7 6 6 6   ALKPHOS 78 85 73 67 66  BILITOT <0.2* <0.2* <0.2* <0.2* <0.2*  PROT 7.4 7.1 6.3 6.1 6.0  ALBUMIN 2.6* 2.6* 2.4* 2.1* 2.0*    Recent Labs Lab 01/10/14 0500  LIPASE 19   No results for input(s): AMMONIA in the last 168 hours.  CBC:  Recent Labs Lab  01/08/14 2007 01/09/14 0319 01/10/14 0500 01/11/14 0535  WBC 10.9* 11.4* 9.1 9.0  NEUTROABS 8.0*  --  5.2 5.4  HGB 12.9 11.1* 10.1* 9.7*  HCT 40.4 33.6* 30.7* 30.9*  MCV 90.8 89.6 91.1 90.4  PLT 253 205 223 233    Cardiac Enzymes: No results for input(s): CKTOTAL, CKMB, CKMBINDEX, TROPONINI in the last 168 hours.  BNP: Invalid input(s): POCBNP  CBG:  Recent Labs Lab 01/08/14 2210 01/09/14 0817 01/10/14 0800 01/11/14 0739  GLUCAP 91 98 102* 84    Microbiology: Results for orders placed or performed during the hospital encounter of 01/08/14  MRSA PCR Screening     Status: None   Collection Time: 01/08/14  8:38 PM  Result Value Ref Range Status   MRSA by PCR NEGATIVE NEGATIVE Final    Comment:        The GeneXpert MRSA  Assay (FDA approved for NASAL specimens only), is one component of a comprehensive MRSA colonization surveillance program. It is not intended to diagnose MRSA infection nor to guide or monitor treatment for MRSA infections.   Urine culture     Status: None   Collection Time: 01/08/14 10:28 PM  Result Value Ref Range Status   Specimen Description URINE, CATHETERIZED  Final   Special Requests NONE  Final   Culture  Setup Time   Final    01/08/2014 23:11 Performed at [facility]    Colony Count NO GROWTH Performed at [facility]   Final   Culture NO GROWTH Performed at [facility]   Final   Report Status 01/10/2014 FINAL  Final  Culture, blood (routine x 2)     Status: None (Preliminary result)   Collection Time: 01/09/14  1:25 PM  Result Value Ref Range Status   Specimen Description BLOOD LEFT ANTECUBITAL  Final   Special Requests BOTTLES DRAWN AEROBIC AND ANAEROBIC 10CC  Final   Culture  Setup Time   Final    01/09/2014 18:02 Performed at [facility]    Culture   Final           BLOOD CULTURE RECEIVED NO GROWTH TO DATE CULTURE WILL BE HELD FOR 5 DAYS BEFORE ISSUING A FINAL NEGATIVE  REPORT Performed at [facility]    Report Status PENDING  Incomplete  Culture, blood (routine x 2)     Status: None (Preliminary result)   Collection Time: 01/09/14  1:30 PM  Result Value Ref Range Status   Specimen Description BLOOD RIGHT IV START  Final   Special Requests BOTTLES DRAWN AEROBIC AND ANAEROBIC 10CC  Final   Culture  Setup Time   Final    01/09/2014 18:02 Performed at [facility]    Culture   Final           BLOOD CULTURE RECEIVED NO GROWTH TO DATE CULTURE WILL BE HELD FOR 5 DAYS BEFORE ISSUING A FINAL NEGATIVE REPORT Performed at [facility]    Report Status PENDING  Incomplete  Clostridium Difficile by PCR     Status: None   Collection Time: 01/09/14  1:50 PM  Result Value Ref Range Status   C difficile by pcr NEGATIVE NEGATIVE Final    Coagulation Studies: No results for input(s): LABPROT, INR in the last 72 hours.  Urinalysis:  Recent Labs  01/08/14 2228  COLORURINE YELLOW  LABSPEC 1.009  PHURINE 7.0  GLUCOSEU NEGATIVE  HGBUR SMALL*  BILIRUBINUR NEGATIVE  KETONESUR 15*  PROTEINUR 100*  UROBILINOGEN 0.2  NITRITE NEGATIVE  LEUKOCYTESUR TRACE*      Imaging: Dg Chest Port 1 View  01/09/2014   CLINICAL DATA:  Hypoxia  EXAM: PORTABLE CHEST - 1 VIEW  COMPARISON:  01/08/2014  FINDINGS: Cardiomediastinal silhouette is stable. Mild elevation of the left hemidiaphragm. There is central mild vascular congestion and mild interstitial prominence suspicious for mild interstitial edema. Small left pleural effusion with left basilar atelectasis or infiltrate. Right IJ sheath is unchanged in position.  IMPRESSION: Mild vascular congestion mild interstitial prominence suspicious for mild interstitial edema. Small left pleural effusion with left basilar atelectasis or infiltrate.   Electronically Signed   By: Natasha MeadLiviu  Pop M.D.   On: 01/09/2014 10:45     Medications:     . antiseptic oral rinse  7 mL Mouth Rinse BID  . aspirin EC  81 mg  Oral Daily  .  cefTRIAXone (ROCEPHIN)  IV  1 g Intravenous Q24H  . folic acid  1 mg Oral Daily  . heparin  5,000 Units Subcutaneous 3 times per day  . ipratropium-albuterol  3 mL Nebulization TID  . latanoprost  1 drop Both Eyes QHS  . levothyroxine  25 mcg Oral QAC breakfast  . multivitamin with minerals  1 tablet Oral Daily  . pilocarpine  1 drop Right Eye QID  . thiamine  100 mg Oral Daily   acetaminophen **OR** acetaminophen, heparin, ondansetron **OR** ondansetron (ZOFRAN) IV  Assessment/ Plan:   Acute renal failure with hyperkalemia requiring emergent dialysis 11/24 awaiting SPEP and UPEP Renal U/S normal size kidneys   Hypotension some what better  Anemia Hb decreased on anticoagulation  Interesting situation in patient with poor functional status at baseline secondary to COPD Presented with nausea and vomiting and probably volume depletion. Her BP has responded to fluid boluses  This could be CKD with progression or ATN from volume depletion. Urine output improved  Left arm edema consider U/S  LOS: 3 Jaeda Bruso W @TODAY @8 :55 AM

## 2014-01-11 NOTE — Progress Notes (Signed)
PULMONARY / CRITICAL CARE MEDICINE   Name: [name] MRN: [id number] DOB: February 08, 1935    ADMISSION DATE:  01/08/2014 CONSULTATION DATE:  01/09/14  REFERRING MD :  Dr. Sharon SellerMcClung   CHIEF COMPLAINT:  Hypotension   INITIAL PRESENTATION: 79 y/o F admitted [facility] on 11/24 with reports of vomiting, diarrhea & abdominal pain.  Work up found her to have acute kidney injury with life threatening hyperkalemia (no known hx of renal disease).    STUDIES:  11/24  Renal US >> no obstructive uropathy, increased renal cortical echogenicity c/w medical renal disease 11/25  CXR >> mild interstitial edema, small L effusion w L basilar atx  SIGNIFICANT EVENTS: 11/24  [facility] in the setting of diarrhea, vomiting & abd pain.    SUBJECTIVE:  C/o mild abd pain, less confused, bp better  VITAL SIGNS: Temp:  [97.9 F (36.6 C)-98.3 F (36.8 C)] 98.1 F (36.7 C) (11/27 0730) Pulse Rate:  [42-107] 101 (11/27 0730) Resp:  [20-26] 24 (11/27 0730) BP: (114-130)/(61-90) 130/66 mmHg (11/27 0730) SpO2:  [97 %-99 %] 97 % (11/27 0730) Weight:  [99.3 kg (218 lb 14.7 oz)] 99.3 kg (218 lb 14.7 oz) (11/27 0437)   HEMODYNAMICS: BP better       INTAKE / OUTPUT:  Intake/Output Summary (Last 24 hours) at 01/11/14 1051 Last data filed at 01/11/14 0011  Gross per 24 hour  Intake    920 ml  Output    250 ml  Net    670 ml    PHYSICAL EXAMINATION: General:  wdwn adult female in NAD  Neuro:  Awake/alert, MAE, generalized weakness  HEENT:  Mm pink/moist, no jvd Cardiovascular:  s1s2 reg, few PVC's noted on monitor  Lungs:  clearer  Abdomen:  Soft, NTND, bsx4 active  Musculoskeletal:  No acute deformities  Skin:  Warm/dry, no edema   LABS:  CBC  Recent Labs Lab 01/09/14 0319 01/10/14 0500 01/11/14 0535  WBC 11.4* 9.1 9.0  HGB 11.1* 10.1* 9.7*  HCT 33.6* 30.7* 30.9*  PLT 205 223 233   Coag's No results for input(s): APTT, INR in the last 168 hours.    BMET  Recent Labs Lab 01/09/14 0319 01/09/14 0350 01/10/14 0500 01/11/14 0535  NA 140  --  141 141  K 4.8 4.0 4.2 4.8  CL 101  --  104 104  CO2 20  --  21 19  BUN 44*  --  49* 50*  CREATININE 6.64*  --  8.99* 9.89*  GLUCOSE 84  --  88 82     Electrolytes  Recent Labs Lab 01/09/14 0319 01/10/14 0500 01/11/14 0535  CALCIUM 8.1* 7.3* 7.0*  MG  --   --  1.8   Sepsis Markers  Recent Labs Lab 01/09/14 0500 01/09/14 1330 01/11/14 0535  LATICACIDVEN 1.5  --   --   PROCALCITON  --  0.35 0.39   ABG  Recent Labs Lab 01/08/14 2242 01/09/14 1438  PHART 7.116* 7.302*  PCO2ART 41.9 44.0  PO2ART 112.0* 102.0*   Liver Enzymes  Recent Labs Lab 01/09/14 0319 01/10/14 0500 01/11/14 0535  AST 7 6 8   ALT 6 6 6   ALKPHOS 73 67 66  BILITOT <0.2* <0.2* <0.2*  ALBUMIN 2.4* 2.1* 2.0*   Cardiac Enzymes No results for input(s): TROPONINI, PROBNP in the last 168 hours.   Glucose  Recent Labs Lab 01/08/14 2210 01/09/14 0817 01/10/14 0800 01/11/14 0739  GLUCAP 91 98 102* 84  Imaging No results found.   ASSESSMENT / PLAN:  PULMONARY  A: Small L Pleural Effusion  L Basilar Atelectasis  COPD increasing edema? P:   Pulmonary Hygiene:  Incentive spirometry, mobilize as tolerated Oxygen to support sats 88-95% Duoneb + PRN Albuterol   CARDIOVASCULAR  A:  PVC's  Peaked T's - resolved on EKG 11/25  Hypotension - brief, responded to 250 ml 11/25  Cortisol NORMAL P:  Monitor BP trend, much improved this AM. Negative Lactate reassuring of perfusion. Volume resuscitation for now.  RENAL A:   RIJ HD catheter 11/25 Acute Renal Failure - in setting of volume depletion / gastroenteritis  Renal US negative.   Hyperkalemia  Improved  P:   No HD for today, continue to monitor Volume resuscitation. No need for ICU transfer, even if HD is needed, BP would allow for regular dialysis at this point.  GASTROINTESTINAL A:   Nausea / Vomiting resolved   Diarrhea improved  P:   PRN Zofran  Follow stool studies, see ID  Diet as tolerated  MVI  HEMATOLOGIC A:   Anemia  P:  Monitor CBC DVT: heparin sq  INFECTIOUS A:   Diarrhea -improved, C DIFF NEG  R/o enteric pathogen P:   BCx2 11/24 >>  UC 11/24 >> NG C-diff 11/25 >> NEG PCT 11/26: NORMAL  Consider d/c of ABX with neg c/s data and NORMAL PCT  Consider stool assessment, culture, [id number] etc Will defer to primary.  ENDOCRINE A:   Hypothyroidism - TSH wnl on admission  P:   Continue IV synthroid   NEUROLOGIC A:   Acute Encephalopathy - resolved P:   Supportive care PT efforts as tolerated   PCCM will sign off, please call back if needed.   Alyson ReedyWesam G. Yacoub, M.D. [facility] Pulmonary/Critical Care Medicine. Pager: [phone]. After hours pager: [phone].  01/11/2014, 10:51 AM

## 2014-01-11 NOTE — Progress Notes (Signed)
[facility]  Daymon Larsenellie Maxon YNW:[id number] DOB: 01-24-1935 DOA: 01/08/2014 PCP: No primary care provider on file.  Brief narrative: 79 year old female w/ a hx of COPD and HTN who was transferred from [facility].  She presented to that emergency department with complaints of diarrhea for at least 3 days. Apparently she had reported abdominal pain associated with this diarrhea and decreasing urinary output with shortness of breath. No apparent history of chronic kidney disease but baseline BUN and creatinine were unavailable at time of admission.  Evaluation [facility] was significant for acute renal failure w/ creatinine greater than 14 and potassium greater than 9. She was tx w/ medical therapies for this but potassium only decreased to 8. Her EKG showed widened QRS and subtly peaked T waves when compared to previous EKG from 2009. Because of her acuity and possible need for acute dialysis [facility] was [facility]. Her initial chest x-ray demonstrated pulmonary edema.  After arrival she was evaluated by Nephrology who determined urgent hemodialysis was indicated. Temporary dialysis catheter was inserted by PCCM and she underwent urgent hemodialysis. Her potassium easily corrected with dialysis; her creatinine also decreased to 6. Postdialysis patient had issues with hypotension that required fluid challenges.   HPI/Subjective: Pt is alert and conversant.  C/o anorexia and low grade nausea.  Denies chest pain or sob.    Assessment/Plan:  Acute renal failure / emergent Hyperkalemia w/ peaked T waves -baseline renal function unknown -labs improved with initial HD tx 11/24 -Renal ultrasound demonstrated mild medical renal disease -?due to diarrhea in setting of preadmit diuretics and anti HTN agents (ie ? Hypotension/ATN) -Nephrology following   Acute respiratory failure with  hypoxia / pulmonary edema -much improved at this time  -cont supportive care  L arm edema  -Check US to r/o DVT  Hypotension -BP has improved  -cortisol normal for clinical setting  -No signif pericardial effusion noted on TTE -no evidence of sepsis / infection   Acute encephalopathy -due to ARF   Diarrhea -?acute gastroenteritis vs due to evolving ARF pre admit -no further diarrhea since admission  COPD  -stable without wheezing -was on Advair and Proair at home  Hx fo HTN  -was on Tenormin and Lasix at home  Hypothyroidism -TSH normal, though not at goal of 1.0 for replacement  -cont Synthroid  GERD   DVT prophylaxis: Subcutaneous heparin  Code Status: Full Family Communication: spoke w/ daughter at bedside  Disposition Plan/Expected LOS: SDU  Consultants: Nephrology PCCM  Procedures: Insertion hemodialysis catheter 11/24 11/25 - TTE   Antibiotics: Rocephin 11/24 > 11/26  Objective: Blood pressure 104/25, pulse 90, temperature 98.1 F (36.7 C), temperature source Oral, resp. rate 20, height 5\' 8"  (1.727 m), weight 99.3 kg (218 lb 14.7 oz), SpO2 100 %.  Intake/Output Summary (Last 24 hours) at 01/11/14 1710 Last data filed at 01/11/14 0800  Gross per 24 hour  Intake    510 ml  Output    125 ml  Net    385 ml   Exam: Gen: No acute respiratory distress - alert and conversant  Chest: mild bibasilar crackles - no wheeze  Cardiac: Regular rate and rhythm, no appreciable M  Abdomen: Soft nontender nondistended without obvious hepatosplenomegaly, no ascites, bowel sounds + Extremities: L arm w/ 1+ edema w/ 1+ radial pulse w/ no ishcemia of fingers and not painful to touch - 1+ B LE  edema   Scheduled Meds:  Scheduled Meds: . antiseptic oral rinse  7 mL Mouth Rinse BID  . aspirin EC  81 mg Oral Daily  . cefTRIAXone (ROCEPHIN)  IV  1 g Intravenous Q24H  . folic acid  1 mg Oral Daily  . heparin  5,000 Units Subcutaneous 3 times per day  .  ipratropium-albuterol  3 mL Nebulization TID  . latanoprost  1 drop Both Eyes QHS  . levothyroxine  25 mcg Oral QAC breakfast  . multivitamin with minerals  1 tablet Oral Daily  . pilocarpine  1 drop Right Eye QID  . thiamine  100 mg Oral Daily   Data Reviewed: Basic Metabolic Panel:  Recent Labs Lab 01/08/14 2007 01/08/14 2200  01/09/14 0104 01/09/14 0319 01/09/14 0350 01/10/14 0500 01/11/14 0535  NA 137 139  --   --  140  --  141 141  K >7.7* >7.7*  < > 5.0 4.8 4.0 4.2 4.8  CL 105 106  --   --  101  --  104 104  CO2 13* 13*  --   --  20  --  21 19  GLUCOSE 84 94  --   --  84  --  88 82  BUN 91* 89*  --   --  44*  --  49* 50*  CREATININE 11.12* 11.83*  --   --  6.64*  --  8.99* 9.89*  CALCIUM 9.1 9.2  --   --  8.1*  --  7.3* 7.0*  MG  --   --   --   --   --   --   --  1.8  < > = values in this interval not displayed.   Liver Function Tests:  Recent Labs Lab 01/08/14 2007 01/08/14 2200 01/09/14 0319 01/10/14 0500 01/11/14 0535  AST 12 8 7 6 8   ALT 7 7 6 6 6   ALKPHOS 78 85 73 67 66  BILITOT <0.2* <0.2* <0.2* <0.2* <0.2*  PROT 7.4 7.1 6.3 6.1 6.0  ALBUMIN 2.6* 2.6* 2.4* 2.1* 2.0*    Recent Labs Lab 01/10/14 0500  LIPASE 19    CBC:  Recent Labs Lab 01/08/14 2007 01/09/14 0319 01/10/14 0500 01/11/14 0535  WBC 10.9* 11.4* 9.1 9.0  NEUTROABS 8.0*  --  5.2 5.4  HGB 12.9 11.1* 10.1* 9.7*  HCT 40.4 33.6* 30.7* 30.9*  MCV 90.8 89.6 91.1 90.4  PLT 253 205 223 233   CBG:  Recent Labs Lab 01/08/14 2210 01/09/14 0817 01/10/14 0800 01/11/14 0739  GLUCAP 91 98 102* 84    Recent Results (from the past 240 hour(s))  MRSA PCR Screening     Status: None   Collection Time: 01/08/14  8:38 PM  Result Value Ref Range Status   MRSA by PCR NEGATIVE NEGATIVE Final    Comment:        The GeneXpert MRSA Assay (FDA approved for NASAL specimens only), is one component of a comprehensive MRSA colonization surveillance program. It is not intended to diagnose  MRSA infection nor to guide or monitor treatment for MRSA infections.   Urine culture     Status: None   Collection Time: 01/08/14 10:28 PM  Result Value Ref Range Status   Specimen Description URINE, CATHETERIZED  Final   Special Requests NONE  Final   Culture  Setup Time   Final    01/08/2014 23:11 Performed at [facility]    Colony Count NO GROWTH Performed at  [facility]   Final   Culture NO GROWTH Performed at [facility]   Final   Report Status 01/10/2014 FINAL  Final  Culture, blood (routine x 2)     Status: None (Preliminary result)   Collection Time: 01/09/14  1:25 PM  Result Value Ref Range Status   Specimen Description BLOOD LEFT ANTECUBITAL  Final   Special Requests BOTTLES DRAWN AEROBIC AND ANAEROBIC 10CC  Final   Culture  Setup Time   Final    01/09/2014 18:02 Performed at [facility]    Culture   Final           BLOOD CULTURE RECEIVED NO GROWTH TO DATE CULTURE WILL BE HELD FOR 5 DAYS BEFORE ISSUING A FINAL NEGATIVE REPORT Performed at [facility]    Report Status PENDING  Incomplete  Culture, blood (routine x 2)     Status: None (Preliminary result)   Collection Time: 01/09/14  1:30 PM  Result Value Ref Range Status   Specimen Description BLOOD RIGHT IV START  Final   Special Requests BOTTLES DRAWN AEROBIC AND ANAEROBIC 10CC  Final   Culture  Setup Time   Final    01/09/2014 18:02 Performed at [facility]    Culture   Final           BLOOD CULTURE RECEIVED NO GROWTH TO DATE CULTURE WILL BE HELD FOR 5 DAYS BEFORE ISSUING A FINAL NEGATIVE REPORT Performed at [facility]    Report Status PENDING  Incomplete  Clostridium Difficile by PCR     Status: None   Collection Time: 01/09/14  1:50 PM  Result Value Ref Range Status   C difficile by pcr NEGATIVE NEGATIVE Final     Studies:  Recent x-ray studies have been reviewed in detail by the Attending Physician  Time spent :   35mins  Lonia BloodJeffrey T. McClung, MD [facility] For Consults/Admissions - Flow Manager - [phone] Office  [phone] Pager [phone]  On-Call/Text Page:      [email or web address]      password [email or web address]  01/11/2014, 5:10 PM   LOS: 3 days

## 2014-01-12 DIAGNOSIS — R609 Edema, unspecified: Secondary | ICD-10-CM

## 2014-01-12 LAB — RENAL FUNCTION PANEL
ANION GAP: 20 — AB (ref 5–15)
Albumin: 2 g/dL — ABNORMAL LOW (ref 3.5–5.2)
BUN: 53 mg/dL — ABNORMAL HIGH (ref 6–23)
CO2: 16 mEq/L — ABNORMAL LOW (ref 19–32)
Calcium: 7 mg/dL — ABNORMAL LOW (ref 8.4–10.5)
Chloride: 104 mEq/L (ref 96–112)
Creatinine, Ser: 10.46 mg/dL — ABNORMAL HIGH (ref 0.50–1.10)
GFR calc non Af Amer: 3 mL/min — ABNORMAL LOW (ref >90)
GFR, EST AFRICAN AMERICAN: 4 mL/min — AB (ref >90)
GLUCOSE: 77 mg/dL (ref 70–99)
POTASSIUM: 5.3 meq/L (ref 3.7–5.3)
Phosphorus: 8.2 mg/dL — ABNORMAL HIGH (ref 2.3–4.6)
SODIUM: 140 meq/L (ref 137–147)

## 2014-01-12 LAB — CBC
HCT: 31.7 % — ABNORMAL LOW (ref 36.0–46.0)
HEMOGLOBIN: 10.3 g/dL — AB (ref 12.0–15.0)
MCH: 29.4 pg (ref 26.0–34.0)
MCHC: 32.5 g/dL (ref 30.0–36.0)
MCV: 90.6 fL (ref 78.0–100.0)
Platelets: 236 10*3/uL (ref 150–400)
RBC: 3.5 MIL/uL — AB (ref 3.87–5.11)
RDW: 14.6 % (ref 11.5–15.5)
WBC: 11.4 10*3/uL — ABNORMAL HIGH (ref 4.0–10.5)

## 2014-01-12 LAB — GLUCOSE, CAPILLARY: Glucose-Capillary: 77 mg/dL (ref 70–99)

## 2014-01-12 MED ORDER — ALBUTEROL SULFATE (2.5 MG/3ML) 0.083% IN NEBU
2.5000 mg | INHALATION_SOLUTION | RESPIRATORY_TRACT | Status: DC | PRN
  Administered 2014-01-16 – 2014-01-21 (×2): 2.5 mg via RESPIRATORY_TRACT
  Filled 2014-01-12 (×2): qty 3

## 2014-01-12 MED ORDER — PANTOPRAZOLE SODIUM 40 MG PO TBEC
40.0000 mg | DELAYED_RELEASE_TABLET | Freq: Two times a day (BID) | ORAL | Status: DC
  Administered 2014-01-12 – 2014-01-21 (×19): 40 mg via ORAL
  Filled 2014-01-12 (×16): qty 1

## 2014-01-12 MED ORDER — LACTULOSE 10 GM/15ML PO SOLN
30.0000 g | Freq: Three times a day (TID) | ORAL | Status: DC
Start: 2014-01-12 — End: 2014-01-13
  Administered 2014-01-12 – 2014-01-13 (×2): 30 g via ORAL
  Filled 2014-01-12 (×5): qty 45

## 2014-01-12 NOTE — Progress Notes (Signed)
[facility] ROUNDING NOTE   Subjective:   Interval History:  Daughter in room     Constipated   Objective:  Vital signs in last 24 hours:  Temp:  [97.5 F (36.4 C)-98.4 F (36.9 C)] 97.8 F (36.6 C) (11/28 0816) Pulse Rate:  [90-105] 103 (11/28 0816) Resp:  [14-27] 20 (11/28 0816) BP: (104-138)/(25-85) 108/85 mmHg (11/28 0816) SpO2:  [96 %-100 %] 99 % (11/28 0829) Weight:  [102.4 kg (225 lb 12 oz)] 102.4 kg (225 lb 12 oz) (11/28 0414)  Weight change: 3.1 kg (6 lb 13.4 oz) Filed Weights   01/10/14 0358 01/11/14 0437 01/12/14 0414  Weight: 96.4 kg (212 lb 8.4 oz) 99.3 kg (218 lb 14.7 oz) 102.4 kg (225 lb 12 oz)    Intake/Output: I/O last 3 completed shifts: In: 370 [P.O.:220; IV Piggyback:150] Out: 1025 [Urine:1025]   Intake/Output this shift:  Total I/O In: -  Out: 125 [Urine:125]  CVS- RRR RS- CTA diminished breath sounds using oxygen ABD- BS  hypoactive EXT- Left arm edema   Basic Metabolic Panel:  Recent Labs Lab 01/08/14 2200  01/09/14 0319 01/09/14 0350 01/10/14 0500 01/11/14 0535 01/12/14 0203  NA 139  --  140  --  141 141 140  K >7.7*  < > 4.8 4.0 4.2 4.8 5.3  CL 106  --  101  --  104 104 104  CO2 13*  --  20  --  21 19 16*  GLUCOSE 94  --  84  --  88 82 77  BUN 89*  --  44*  --  49* 50* 53*  CREATININE 11.83*  --  6.64*  --  8.99* 9.89* 10.46*  CALCIUM 9.2  --  8.1*  --  7.3* 7.0* 7.0*  MG  --   --   --   --   --  1.8  --   PHOS  --   --   --   --   --   --  8.2*  < > = values in this interval not displayed.  Liver Function Tests:  Recent Labs Lab 01/08/14 2007 01/08/14 2200 01/09/14 0319 01/10/14 0500 01/11/14 0535 01/12/14 0203  AST 12 8 7 6 8   --   ALT 7 7 6 6 6   --   ALKPHOS 78 85 73 67 66  --   BILITOT <0.2* <0.2* <0.2* <0.2* <0.2*  --   PROT 7.4 7.1 6.3 6.1 6.0  --   ALBUMIN 2.6* 2.6* 2.4* 2.1* 2.0* 2.0*    Recent Labs Lab 01/10/14 0500  LIPASE 19   No results for input(s): AMMONIA in the last 168  hours.  CBC:  Recent Labs Lab 01/08/14 2007 01/09/14 0319 01/10/14 0500 01/11/14 0535 01/12/14 0203  WBC 10.9* 11.4* 9.1 9.0 11.4*  NEUTROABS 8.0*  --  5.2 5.4  --   HGB 12.9 11.1* 10.1* 9.7* 10.3*  HCT 40.4 33.6* 30.7* 30.9* 31.7*  MCV 90.8 89.6 91.1 90.4 90.6  PLT 253 205 223 233 236    Cardiac Enzymes: No results for input(s): CKTOTAL, CKMB, CKMBINDEX, TROPONINI in the last 168 hours.  BNP: Invalid input(s): POCBNP  CBG:  Recent Labs Lab 01/08/14 2210 01/09/14 0817 01/10/14 0800 01/11/14 0739 01/12/14 0808  GLUCAP 91 98 102* 84 77    Microbiology: Results for orders placed or performed during the hospital encounter of 01/08/14  MRSA PCR Screening     Status: None   Collection Time: 01/08/14  8:38 PM  Result Value Ref Range Status   MRSA by PCR NEGATIVE NEGATIVE Final    Comment:        The GeneXpert MRSA Assay (FDA approved for NASAL specimens only), is one component of a comprehensive MRSA colonization surveillance program. It is not intended to diagnose MRSA infection nor to guide or monitor treatment for MRSA infections.   Urine culture     Status: None   Collection Time: 01/08/14 10:28 PM  Result Value Ref Range Status   Specimen Description URINE, CATHETERIZED  Final   Special Requests NONE  Final   Culture  Setup Time   Final    01/08/2014 23:11 Performed at [facility]    Colony Count NO GROWTH Performed at [facility]   Final   Culture NO GROWTH Performed at [facility]   Final   Report Status 01/10/2014 FINAL  Final  Culture, blood (routine x 2)     Status: None (Preliminary result)   Collection Time: 01/09/14  1:25 PM  Result Value Ref Range Status   Specimen Description BLOOD LEFT ANTECUBITAL  Final   Special Requests BOTTLES DRAWN AEROBIC AND ANAEROBIC 10CC  Final   Culture  Setup Time   Final    01/09/2014 18:02 Performed at [facility]    Culture   Final           BLOOD CULTURE  RECEIVED NO GROWTH TO DATE CULTURE WILL BE HELD FOR 5 DAYS BEFORE ISSUING A FINAL NEGATIVE REPORT Performed at [facility]    Report Status PENDING  Incomplete  Culture, blood (routine x 2)     Status: None (Preliminary result)   Collection Time: 01/09/14  1:30 PM  Result Value Ref Range Status   Specimen Description BLOOD RIGHT IV START  Final   Special Requests BOTTLES DRAWN AEROBIC AND ANAEROBIC 10CC  Final   Culture  Setup Time   Final    01/09/2014 18:02 Performed at [facility]    Culture   Final           BLOOD CULTURE RECEIVED NO GROWTH TO DATE CULTURE WILL BE HELD FOR 5 DAYS BEFORE ISSUING A FINAL NEGATIVE REPORT Performed at [facility]    Report Status PENDING  Incomplete  Clostridium Difficile by PCR     Status: None   Collection Time: 01/09/14  1:50 PM  Result Value Ref Range Status   C difficile by pcr NEGATIVE NEGATIVE Final    Coagulation Studies: No results for input(s): LABPROT, INR in the last 72 hours.  Urinalysis: No results for input(s): COLORURINE, LABSPEC, PHURINE, GLUCOSEU, HGBUR, BILIRUBINUR, KETONESUR, PROTEINUR, UROBILINOGEN, NITRITE, LEUKOCYTESUR in the last 72 hours.  Invalid input(s): APPERANCEUR    Imaging: No results found.   Medications:     . antiseptic oral rinse  7 mL Mouth Rinse BID  . aspirin EC  81 mg Oral Daily  . folic acid  1 mg Oral Daily  . heparin  5,000 Units Subcutaneous 3 times per day  . ipratropium-albuterol  3 mL Nebulization TID  . latanoprost  1 drop Both Eyes QHS  . levothyroxine  25 mcg Oral QAC breakfast  . multivitamin with minerals  1 tablet Oral Daily  . pilocarpine  1 drop Right Eye QID  . senna-docusate  1 tablet Oral BID  . thiamine  100 mg Oral Daily   acetaminophen **OR** acetaminophen, albuterol, heparin, ondansetron **OR** ondansetron (ZOFRAN) IV  Assessment/ Plan:   Acute  renal failure with hyperkalemia requiring emergent dialysis 11/24 awaiting SPEP and UPEP Renal  U/S normal size kidneys   Hypotension some what better  Anemia Hb decreased on anticoagulation  Interesting situation in patient with poor functional status at baseline secondary to COPD Presented with nausea and vomiting and probably volume depletion. Her BP has responded to fluid boluses This could be CKD with progression or ATN from volume depletion, still trying to track down some previous labs Urine output improved    LOS: 4 [name] W @TODAY @11 :07 AM

## 2014-01-12 NOTE — Progress Notes (Signed)
[facility]  [name] [id number]:[id number] DOB: May 26, 1934 DOA: 01/08/2014 PCP: No primary care provider on file.  Brief narrative: 79 year old female w/ a hx of COPD and HTN who was transferred [facility].  She presented to that emergency department with complaints of diarrhea for at least 3 days. Apparently she had reported abdominal pain associated with this diarrhea and decreasing urinary output with shortness of breath. No apparent history of chronic kidney disease but baseline BUN and creatinine were unavailable at time of admission.  Evaluation in the emergency department at [facility] was significant for acute renal failure w/ creatinine greater than 14 and potassium greater than 9. She was tx w/ medical therapies for this but potassium only decreased to 8. Her EKG showed widened QRS and subtly peaked T waves when compared to previous EKG from 2009. Because of her acuity and possible need for acute dialysis [facility]. Her initial chest x-ray demonstrated pulmonary edema.  After arrival she was evaluated by Nephrology who determined urgent hemodialysis was indicated. Temporary dialysis catheter was inserted by PCCM and she underwent urgent hemodialysis. Her potassium easily corrected with dialysis; her creatinine also decreased to 6. Postdialysis patient had issues with hypotension that required fluid challenges.   HPI/Subjective: Still c/o severe constipation and nausea.  Denies cp or sob.  Has begun to make a bit more urine (900cc yesterday).    Assessment/Plan:  Acute renal failure / emergent Hyperkalemia w/ peaked T waves -baseline renal function unknown -K+ improved with initial HD tx 11/24 -Renal ultrasound demonstrated mild medical renal disease -SPEP and UPEP pending  -?due to diarrhea in setting of preadmit diuretics and anti HTN agents (ie ? Hypotension/ATN) -Nephrology  following   Acute respiratory failure with hypoxia / pulmonary edema -much improved at this time  -cont supportive care -appears to have been volume overload due to AKF  L arm SVT -No DVT on venous duplex - keep arm elevated as able  Hypotension -BP has improved  -cortisol normal for clinical setting  -No signif pericardial effusion noted on TTE -no evidence of sepsis / infection   Acute encephalopathy -due to ARF - resolved at this time   Diarrhea > Constipation  -?acute gastroenteritis vs due to evolving ARF pre admit -no further diarrhea since admission - in fact pt now constipated  -C diff negative   COPD  -stable without wheezing -was on Advair and Proair at home  Hx fo HTN  -was on Tenormin and Lasix at home - not requiring tx at present   Hypothyroidism -TSH normal, though not at goal of 1.0 for replacement  -cont Synthroid  GERD  Maximize protonix  DVT prophylaxis: Subcutaneous heparin  Code Status: Full Family Communication: spoke w/ daughter and son at bedside  Disposition Plan/Expected LOS: SDU  Consultants: Nephrology PCCM  Procedures: Insertion hemodialysis catheter 11/24 11/25 - TTE - EF 55% - grade 1 DD  Antibiotics: Rocephin 11/24 > 11/26  Objective: Blood pressure 116/55, pulse 98, temperature 98.1 F (36.7 C), temperature source Oral, resp. rate 22, height 5\' 8"  (1.727 m), weight 102.4 kg (225 lb 12 oz), SpO2 98 %.  Intake/Output Summary (Last 24 hours) at 01/12/14 1423 Last data filed at 01/12/14 1151  Gross per 24 hour  Intake     60 ml  Output   1275 ml  Net  -1215 ml   Exam: Gen: No acute respiratory distress -  alert and conversant  Chest: mild bibasilar crackles - no wheeze  Cardiac: Regular rate and rhythm, no appreciable M - distant HS  Abdomen: Soft nontender nondistended without obvious hepatosplenomegaly, no ascites, bowel sounds + Extremities: L arm w/ 1+ edema w/ 1+ radial pulse w/ no ishcemia of fingers and not  painful to touch - 1+ B LE edema   Scheduled Meds:  Scheduled Meds: . antiseptic oral rinse  7 mL Mouth Rinse BID  . aspirin EC  81 mg Oral Daily  . folic acid  1 mg Oral Daily  . heparin  5,000 Units Subcutaneous 3 times per day  . ipratropium-albuterol  3 mL Nebulization TID  . latanoprost  1 drop Both Eyes QHS  . levothyroxine  25 mcg Oral QAC breakfast  . multivitamin with minerals  1 tablet Oral Daily  . pilocarpine  1 drop Right Eye QID  . senna-docusate  1 tablet Oral BID  . thiamine  100 mg Oral Daily   Data Reviewed: Basic Metabolic Panel:  Recent Labs Lab 01/08/14 2200  01/09/14 0319 01/09/14 0350 01/10/14 0500 01/11/14 0535 01/12/14 0203  NA 139  --  140  --  141 141 140  K >7.7*  < > 4.8 4.0 4.2 4.8 5.3  CL 106  --  101  --  104 104 104  CO2 13*  --  20  --  21 19 16*  GLUCOSE 94  --  84  --  88 82 77  BUN 89*  --  44*  --  49* 50* 53*  CREATININE 11.83*  --  6.64*  --  8.99* 9.89* 10.46*  CALCIUM 9.2  --  8.1*  --  7.3* 7.0* 7.0*  MG  --   --   --   --   --  1.8  --   PHOS  --   --   --   --   --   --  8.2*  < > = values in this interval not displayed.   Liver Function Tests:  Recent Labs Lab 01/08/14 2007 01/08/14 2200 01/09/14 0319 01/10/14 0500 01/11/14 0535 01/12/14 0203  AST 12 8 7 6 8   --   ALT 7 7 6 6 6   --   ALKPHOS 78 85 73 67 66  --   BILITOT <0.2* <0.2* <0.2* <0.2* <0.2*  --   PROT 7.4 7.1 6.3 6.1 6.0  --   ALBUMIN 2.6* 2.6* 2.4* 2.1* 2.0* 2.0*    Recent Labs Lab 01/10/14 0500  LIPASE 19   CBC:  Recent Labs Lab 01/08/14 2007 01/09/14 0319 01/10/14 0500 01/11/14 0535 01/12/14 0203  WBC 10.9* 11.4* 9.1 9.0 11.4*  NEUTROABS 8.0*  --  5.2 5.4  --   HGB 12.9 11.1* 10.1* 9.7* 10.3*  HCT 40.4 33.6* 30.7* 30.9* 31.7*  MCV 90.8 89.6 91.1 90.4 90.6  PLT 253 205 223 233 236   CBG:  Recent Labs Lab 01/08/14 2210 01/09/14 0817 01/10/14 0800 01/11/14 0739 01/12/14 0808  GLUCAP 91 98 102* 84 77    Recent Results (from  the past 240 hour(s))  MRSA PCR Screening     Status: None   Collection Time: 01/08/14  8:38 PM  Result Value Ref Range Status   MRSA by PCR NEGATIVE NEGATIVE Final    Comment:        The GeneXpert MRSA Assay (FDA approved for NASAL specimens only), is one component of a comprehensive MRSA colonization surveillance program. It is  not intended to diagnose MRSA infection nor to guide or monitor treatment for MRSA infections.   Urine culture     Status: None   Collection Time: 01/08/14 10:28 PM  Result Value Ref Range Status   Specimen Description URINE, CATHETERIZED  Final   Special Requests NONE  Final   Culture  Setup Time   Final    01/08/2014 23:11 Performed at [facility]    Colony Count NO GROWTH Performed at [facility]   Final   Culture NO GROWTH Performed at [facility]   Final   Report Status 01/10/2014 FINAL  Final  Culture, blood (routine x 2)     Status: None (Preliminary result)   Collection Time: 01/09/14  1:25 PM  Result Value Ref Range Status   Specimen Description BLOOD LEFT ANTECUBITAL  Final   Special Requests BOTTLES DRAWN AEROBIC AND ANAEROBIC 10CC  Final   Culture  Setup Time   Final    01/09/2014 18:02 Performed at [facility]    Culture   Final           BLOOD CULTURE RECEIVED NO GROWTH TO DATE CULTURE WILL BE HELD FOR 5 DAYS BEFORE ISSUING A FINAL NEGATIVE REPORT Performed at [facility]    Report Status PENDING  Incomplete  Culture, blood (routine x 2)     Status: None (Preliminary result)   Collection Time: 01/09/14  1:30 PM  Result Value Ref Range Status   Specimen Description BLOOD RIGHT IV START  Final   Special Requests BOTTLES DRAWN AEROBIC AND ANAEROBIC 10CC  Final   Culture  Setup Time   Final    01/09/2014 18:02 Performed at [facility]    Culture   Final           BLOOD CULTURE RECEIVED NO GROWTH TO DATE CULTURE WILL BE HELD FOR 5 DAYS BEFORE ISSUING A FINAL NEGATIVE  REPORT Performed at [facility]    Report Status PENDING  Incomplete  Clostridium Difficile by PCR     Status: None   Collection Time: 01/09/14  1:50 PM  Result Value Ref Range Status   C difficile by pcr NEGATIVE NEGATIVE Final     Studies:  Recent x-ray studies have been reviewed in detail by the Attending Physician  Time spent :  35mins  Lonia BloodJeffrey T. Khiyan Crace, MD [facility] For Consults/Admissions - Flow Manager - [phone] Office  [phone] Pager [phone]  On-Call/Text Page:      [email or web address] [email or web address]      password [email or web address]  01/12/2014, 2:23 PM   LOS: 4 days

## 2014-01-12 NOTE — Progress Notes (Signed)
VASCULAR LAB PRELIMINARY  PRELIMINARY  PRELIMINARY  PRELIMINARY  Left upper extremity venous Doppler completed.    Preliminary report:  There is no DVT noted in the left upper extremity.  There is SVT noted in both the cephalic and basilic veins from wrist to mid upper arm. Karlene Southard, RVT 01/12/2014, 12:08 PM

## 2014-01-13 ENCOUNTER — Inpatient Hospital Stay (HOSPITAL_COMMUNITY): Payer: Medicare Other

## 2014-01-13 LAB — CBC
HCT: 31.9 % — ABNORMAL LOW (ref 36.0–46.0)
HEMOGLOBIN: 10.1 g/dL — AB (ref 12.0–15.0)
MCH: 29.2 pg (ref 26.0–34.0)
MCHC: 31.7 g/dL (ref 30.0–36.0)
MCV: 92.2 fL (ref 78.0–100.0)
Platelets: 281 10*3/uL (ref 150–400)
RBC: 3.46 MIL/uL — AB (ref 3.87–5.11)
RDW: 14.6 % (ref 11.5–15.5)
WBC: 11.8 10*3/uL — ABNORMAL HIGH (ref 4.0–10.5)

## 2014-01-13 LAB — COMPREHENSIVE METABOLIC PANEL
ALT: 6 U/L (ref 0–35)
AST: 6 U/L (ref 0–37)
Albumin: 2.1 g/dL — ABNORMAL LOW (ref 3.5–5.2)
Alkaline Phosphatase: 66 U/L (ref 39–117)
Anion gap: 22 — ABNORMAL HIGH (ref 5–15)
BUN: 58 mg/dL — ABNORMAL HIGH (ref 6–23)
CALCIUM: 7.7 mg/dL — AB (ref 8.4–10.5)
CO2: 18 mEq/L — ABNORMAL LOW (ref 19–32)
Chloride: 102 mEq/L (ref 96–112)
Creatinine, Ser: 12 mg/dL — ABNORMAL HIGH (ref 0.50–1.10)
GFR calc non Af Amer: 3 mL/min — ABNORMAL LOW (ref >90)
GFR, EST AFRICAN AMERICAN: 3 mL/min — AB (ref >90)
GLUCOSE: 94 mg/dL (ref 70–99)
Potassium: 5.4 mEq/L — ABNORMAL HIGH (ref 3.7–5.3)
Sodium: 142 mEq/L (ref 137–147)
TOTAL PROTEIN: 6.4 g/dL (ref 6.0–8.3)
Total Bilirubin: <0.2 mg/dL — ABNORMAL LOW (ref 0.3–1.2)

## 2014-01-13 LAB — GLUCOSE, CAPILLARY: GLUCOSE-CAPILLARY: 98 mg/dL (ref 70–99)

## 2014-01-13 NOTE — Progress Notes (Signed)
[facility]  [name] WJX:[id number] DOB: 1934/10/29 DOA: 01/08/2014 PCP: No primary care provider on file.  Brief narrative: 79 year old female w/ a hx of COPD and HTN who was transferred from [facility].  She presented to that emergency department with complaints of diarrhea for at least 3 days. Apparently she had reported abdominal pain associated with this diarrhea and decreasing urinary output with shortness of breath. No apparent history of chronic kidney disease but baseline BUN and creatinine were unavailable at time of admission.  Evaluation in the emergency department at [facility] was significant for acute renal failure w/ creatinine greater than 14 and potassium greater than 9. She was tx w/ medical therapies for this but potassium only decreased to 8. Her EKG showed widened QRS and subtly peaked T waves when compared to previous EKG from 2009. Because of her acuity and possible need for acute dialysis she was transferred to [facility] for further management. Her initial chest x-ray demonstrated pulmonary edema.  After arrival she was evaluated by Nephrology who determined urgent hemodialysis was indicated. Temporary dialysis catheter was inserted by PCCM and she underwent urgent hemodialysis. Her potassium easily corrected with dialysis; her creatinine also decreased to 6. Postdialysis patient had issues with hypotension that required fluid challenges.   HPI/Subjective: Pt feels "awful" today.  She c/o nausea with an episode of vomiting today.  She has only had 2 small bowel movements since yesterday.  She denies cp, but is beginning to feel increasing SOB, though she is not yet in resp distress again.    Assessment/Plan:  Acute renal failure / emergent Hyperkalemia w/ peaked T waves -baseline renal function unknown -K+ improved with initial HD tx 11/24 -Renal ultrasound demonstrated mild medical renal disease -SPEP and UPEP  pending  -?due to diarrhea in setting of preadmit diuretics and anti HTN agents (ie ? Hypotension/ATN) -Nephrology following - appear HD will required again very soon   Acute respiratory failure with hypoxia / pulmonary edema -resp status beginning to decline again w/ worsening renal fxn - follow closely for worsening whch would necessitate recurrent emergent HD  -cont supportive care  L arm SVT -No DVT on venous duplex - keep arm elevated as able  Hypotension -resolved  -cortisol normal for clinical setting  -No signif pericardial effusion noted on TTE -no evidence of sepsis / infection   Acute encephalopathy -due to ARF - resolved at this time   Diarrhea > Constipation  -no further diarrhea since admission - in fact pt now constipated  -C diff negative  -check KUB to r/o ilius - intake already very limited presently   COPD  -modest wheezing at present, likely due to edema  -was on Advair and Proair at home  Hx of HTN  -was on Tenormin and Lasix at home - not requiring tx at present   Hypothyroidism -TSH normal, though not at goal of 1.0 for replacement  -cont Synthroid  GERD  Maximize protonix  DVT prophylaxis: Subcutaneous heparin  Code Status: Full Family Communication: spoke w/ daughter at bedside  Disposition Plan/Expected LOS: SDU  Consultants: Nephrology PCCM  Procedures: Insertion hemodialysis catheter 11/24 11/25 - TTE - EF 55% - grade 1 DD  Antibiotics: Rocephin 11/24 > 11/26  Objective: Blood pressure 138/78, pulse 107, temperature 99 F (37.2 C), temperature source Oral, resp. rate 20, height 5\' 8"  (1.727 m), weight 101.9 kg (224 lb 10.4 oz), SpO2 92 %.  Intake/Output Summary (Last 24  hours) at 01/13/14 1429 Last data filed at 01/13/14 0700  Gross per 24 hour  Intake      0 ml  Output    875 ml  Net   -875 ml   Exam: Gen: No acute respiratory distress but wheezing somewhat - able to complete full sentences  Chest: mild bibasilar  crackles - mild exp wheeze  Cardiac: Regular rate and rhythm, no appreciable M - distant HS  Abdomen: Soft, nondistended, without obvious hepatosplenomegaly, no ascites, bowel sounds + Extremities: L arm w/ 1+ edema w/ 1+ radial pulse w/ no ishcemia of fingers and not painful to touch - 1+ B LE edema   Scheduled Meds:  Scheduled Meds: . antiseptic oral rinse  7 mL Mouth Rinse BID  . aspirin EC  81 mg Oral Daily  . folic acid  1 mg Oral Daily  . heparin  5,000 Units Subcutaneous 3 times per day  . ipratropium-albuterol  3 mL Nebulization TID  . lactulose  30 g Oral TID  . latanoprost  1 drop Both Eyes QHS  . levothyroxine  25 mcg Oral QAC breakfast  . multivitamin with minerals  1 tablet Oral Daily  . pantoprazole  40 mg Oral BID  . pilocarpine  1 drop Right Eye QID  . senna-docusate  1 tablet Oral BID  . thiamine  100 mg Oral Daily   Data Reviewed: Basic Metabolic Panel:  Recent Labs Lab 01/09/14 0319 01/09/14 0350 01/10/14 0500 01/11/14 0535 01/12/14 0203 01/13/14 0500  NA 140  --  141 141 140 142  K 4.8 4.0 4.2 4.8 5.3 5.4*  CL 101  --  104 104 104 102  CO2 20  --  21 19 16* 18*  GLUCOSE 84  --  88 82 77 94  BUN 44*  --  49* 50* 53* 58*  CREATININE 6.64*  --  8.99* 9.89* 10.46* 12.00*  CALCIUM 8.1*  --  7.3* 7.0* 7.0* 7.7*  MG  --   --   --  1.8  --   --   PHOS  --   --   --   --  8.2*  --      Liver Function Tests:  Recent Labs Lab 01/08/14 2200 01/09/14 0319 01/10/14 0500 01/11/14 0535 01/12/14 0203 01/13/14 0500  AST 8 7 6 8   --  6  ALT 7 6 6 6   --  6  ALKPHOS 85 73 67 66  --  66  BILITOT <0.2* <0.2* <0.2* <0.2*  --  <0.2*  PROT 7.1 6.3 6.1 6.0  --  6.4  ALBUMIN 2.6* 2.4* 2.1* 2.0* 2.0* 2.1*    Recent Labs Lab 01/10/14 0500  LIPASE 19   CBC:  Recent Labs Lab 01/08/14 2007 01/09/14 0319 01/10/14 0500 01/11/14 0535 01/12/14 0203 01/13/14 0500  WBC 10.9* 11.4* 9.1 9.0 11.4* 11.8*  NEUTROABS 8.0*  --  5.2 5.4  --   --   HGB 12.9 11.1*  10.1* 9.7* 10.3* 10.1*  HCT 40.4 33.6* 30.7* 30.9* 31.7* 31.9*  MCV 90.8 89.6 91.1 90.4 90.6 92.2  PLT 253 205 223 233 236 281   CBG:  Recent Labs Lab 01/09/14 0817 01/10/14 0800 01/11/14 0739 01/12/14 0808 01/13/14 0806  GLUCAP 98 102* 84 77 98    Recent Results (from the past 240 hour(s))  MRSA PCR Screening     Status: None   Collection Time: 01/08/14  8:38 PM  Result Value Ref Range Status   MRSA  by PCR NEGATIVE NEGATIVE Final    Comment:        The GeneXpert MRSA Assay (FDA approved for NASAL specimens only), is one component of a comprehensive MRSA colonization surveillance program. It is not intended to diagnose MRSA infection nor to guide or monitor treatment for MRSA infections.   Urine culture     Status: None   Collection Time: 01/08/14 10:28 PM  Result Value Ref Range Status   Specimen Description URINE, CATHETERIZED  Final   Special Requests NONE  Final   Culture  Setup Time   Final    01/08/2014 23:11 Performed at [facility]    Colony Count NO GROWTH Performed at [facility]   Final   Culture NO GROWTH Performed at [facility]   Final   Report Status 01/10/2014 FINAL  Final  Culture, blood (routine x 2)     Status: None (Preliminary result)   Collection Time: 01/09/14  1:25 PM  Result Value Ref Range Status   Specimen Description BLOOD LEFT ANTECUBITAL  Final   Special Requests BOTTLES DRAWN AEROBIC AND ANAEROBIC 10CC  Final   Culture  Setup Time   Final    01/09/2014 18:02 Performed at [facility]    Culture   Final           BLOOD CULTURE RECEIVED NO GROWTH TO DATE CULTURE WILL BE HELD FOR 5 DAYS BEFORE ISSUING A FINAL NEGATIVE REPORT Performed at [facility]    Report Status PENDING  Incomplete  Culture, blood (routine x 2)     Status: None (Preliminary result)   Collection Time: 01/09/14  1:30 PM  Result Value Ref Range Status   Specimen Description BLOOD RIGHT IV START  Final   Special  Requests BOTTLES DRAWN AEROBIC AND ANAEROBIC 10CC  Final   Culture  Setup Time   Final    01/09/2014 18:02 Performed at [facility]    Culture   Final           BLOOD CULTURE RECEIVED NO GROWTH TO DATE CULTURE WILL BE HELD FOR 5 DAYS BEFORE ISSUING A FINAL NEGATIVE REPORT Performed at [facility]    Report Status PENDING  Incomplete  Clostridium Difficile by PCR     Status: None   Collection Time: 01/09/14  1:50 PM  Result Value Ref Range Status   C difficile by pcr NEGATIVE NEGATIVE Final     Studies:  Recent x-ray studies have been reviewed in detail by the Attending Physician  Time spent :  35mins  Lonia BloodJeffrey T. Kay Shippy, MD [facility] For Consults/Admissions - Flow Manager - [phone] Office  [phone] Pager [phone]  On-Call/Text Page:      Loretha [email or web address]      password [email or web address]  01/13/2014, 2:29 PM   LOS: 5 days

## 2014-01-13 NOTE — Progress Notes (Signed)
[facility] ROUNDING NOTE   Subjective:   Interval History: feels worse today  Objective:  Vital signs in last 24 hours:  Temp:  [98.1 F (36.7 C)-99.4 F (37.4 C)] 99 F (37.2 C) (11/29 0804) Pulse Rate:  [98-110] 107 (11/29 0358) Resp:  [14-22] 20 (11/29 0804) BP: (116-138)/(55-80) 138/78 mmHg (11/29 0804) SpO2:  [91 %-98 %] 92 % (11/29 0358) Weight:  [101.9 kg (224 lb 10.4 oz)] 101.9 kg (224 lb 10.4 oz) (11/29 0358)  Weight change: -0.5 kg (-1 lb 1.6 oz) Filed Weights   01/11/14 0437 01/12/14 0414 01/13/14 0358  Weight: 99.3 kg (218 lb 14.7 oz) 102.4 kg (225 lb 12 oz) 101.9 kg (224 lb 10.4 oz)    Intake/Output: I/O last 3 completed shifts: In: -  Out: 2150 [Urine:2150]   Intake/Output this shift:      CVS- RRR RS- CTA diminished breath sounds using oxygen ABD- BS hypoactive EXT- Left arm edema   Basic Metabolic Panel:  Recent Labs Lab 01/09/14 0319 01/09/14 0350 01/10/14 0500 01/11/14 0535 01/12/14 0203 01/13/14 0500  NA 140  --  141 141 140 142  K 4.8 4.0 4.2 4.8 5.3 5.4*  CL 101  --  104 104 104 102  CO2 20  --  21 19 16* 18*  GLUCOSE 84  --  88 82 77 94  BUN 44*  --  49* 50* 53* 58*  CREATININE 6.64*  --  8.99* 9.89* 10.46* 12.00*  CALCIUM 8.1*  --  7.3* 7.0* 7.0* 7.7*  MG  --   --   --  1.8  --   --   PHOS  --   --   --   --  8.2*  --     Liver Function Tests:  Recent Labs Lab 01/08/14 2200 01/09/14 0319 01/10/14 0500 01/11/14 0535 01/12/14 0203 01/13/14 0500  AST 8 7 6 8   --  6  ALT 7 6 6 6   --  6  ALKPHOS 85 73 67 66  --  66  BILITOT <0.2* <0.2* <0.2* <0.2*  --  <0.2*  PROT 7.1 6.3 6.1 6.0  --  6.4  ALBUMIN 2.6* 2.4* 2.1* 2.0* 2.0* 2.1*    Recent Labs Lab 01/10/14 0500  LIPASE 19   No results for input(s): AMMONIA in the last 168 hours.  CBC:  Recent Labs Lab 01/08/14 2007 01/09/14 0319 01/10/14 0500 01/11/14 0535 01/12/14 0203 01/13/14 0500  WBC 10.9* 11.4* 9.1 9.0 11.4* 11.8*  NEUTROABS 8.0*   --  5.2 5.4  --   --   HGB 12.9 11.1* 10.1* 9.7* 10.3* 10.1*  HCT 40.4 33.6* 30.7* 30.9* 31.7* 31.9*  MCV 90.8 89.6 91.1 90.4 90.6 92.2  PLT 253 205 223 233 236 281    Cardiac Enzymes: No results for input(s): CKTOTAL, CKMB, CKMBINDEX, TROPONINI in the last 168 hours.  BNP: Invalid input(s): POCBNP  CBG:  Recent Labs Lab 01/09/14 0817 01/10/14 0800 01/11/14 0739 01/12/14 0808 01/13/14 0806  GLUCAP 98 102* 84 77 98    Microbiology: Results for orders placed or performed during the hospital encounter of 01/08/14  MRSA PCR Screening     Status: None   Collection Time: 01/08/14  8:38 PM  Result Value Ref Range Status   MRSA by PCR NEGATIVE NEGATIVE Final    Comment:        The GeneXpert MRSA Assay (FDA approved for NASAL specimens only), is one component of a comprehensive MRSA colonization surveillance program.  It is not intended to diagnose MRSA infection nor to guide or monitor treatment for MRSA infections.   Urine culture     Status: None   Collection Time: 01/08/14 10:28 PM  Result Value Ref Range Status   Specimen Description URINE, CATHETERIZED  Final   Special Requests NONE  Final   Culture  Setup Time   Final    01/08/2014 23:11 Performed at [facility]    Colony Count NO GROWTH Performed at [facility]   Final   Culture NO GROWTH Performed at [facility]   Final   Report Status 01/10/2014 FINAL  Final  Culture, blood (routine x 2)     Status: None (Preliminary result)   Collection Time: 01/09/14  1:25 PM  Result Value Ref Range Status   Specimen Description BLOOD LEFT ANTECUBITAL  Final   Special Requests BOTTLES DRAWN AEROBIC AND ANAEROBIC 10CC  Final   Culture  Setup Time   Final    01/09/2014 18:02 Performed at [facility]    Culture   Final           BLOOD CULTURE RECEIVED NO GROWTH TO DATE CULTURE WILL BE HELD FOR 5 DAYS BEFORE ISSUING A FINAL NEGATIVE REPORT Performed at [facility]     Report Status PENDING  Incomplete  Culture, blood (routine x 2)     Status: None (Preliminary result)   Collection Time: 01/09/14  1:30 PM  Result Value Ref Range Status   Specimen Description BLOOD RIGHT IV START  Final   Special Requests BOTTLES DRAWN AEROBIC AND ANAEROBIC 10CC  Final   Culture  Setup Time   Final    01/09/2014 18:02 Performed at [facility]    Culture   Final           BLOOD CULTURE RECEIVED NO GROWTH TO DATE CULTURE WILL BE HELD FOR 5 DAYS BEFORE ISSUING A FINAL NEGATIVE REPORT Performed at [facility]    Report Status PENDING  Incomplete  Clostridium Difficile by PCR     Status: None   Collection Time: 01/09/14  1:50 PM  Result Value Ref Range Status   C difficile by pcr NEGATIVE NEGATIVE Final    Coagulation Studies: No results for input(s): LABPROT, INR in the last 72 hours.  Urinalysis: No results for input(s): COLORURINE, LABSPEC, PHURINE, GLUCOSEU, HGBUR, BILIRUBINUR, KETONESUR, PROTEINUR, UROBILINOGEN, NITRITE, LEUKOCYTESUR in the last 72 hours.  Invalid input(s): APPERANCEUR    Imaging: No results found.   Medications:     . antiseptic oral rinse  7 mL Mouth Rinse BID  . aspirin EC  81 mg Oral Daily  . folic acid  1 mg Oral Daily  . heparin  5,000 Units Subcutaneous 3 times per day  . ipratropium-albuterol  3 mL Nebulization TID  . lactulose  30 g Oral TID  . latanoprost  1 drop Both Eyes QHS  . levothyroxine  25 mcg Oral QAC breakfast  . multivitamin with minerals  1 tablet Oral Daily  . pantoprazole  40 mg Oral BID  . pilocarpine  1 drop Right Eye QID  . senna-docusate  1 tablet Oral BID  . thiamine  100 mg Oral Daily   acetaminophen **OR** acetaminophen, albuterol, heparin, ondansetron **OR** ondansetron (ZOFRAN) IV  Assessment/ Plan:   Acute renal failure with hyperkalemia requiring emergent dialysis 11/24 awaiting SPEP and UPEP Renal U/S normal size kidneys   Hypotension some what better  Anemia Hb  10.1  Interesting situation in patient with poor functional status at baseline secondary to COPD Presented with nausea and vomiting and probably volume depletion. Her BP has responded to fluid boluses This could be CKD with progression or ATN from volume depletion, still trying to track down some previous labs( [facility]) will need to check [place] or Dr Tommy [place] office Urine output improved  LOS: 5 [name] W @TODAY @10 :47 AM

## 2014-01-14 LAB — URINALYSIS, ROUTINE W REFLEX MICROSCOPIC
BILIRUBIN URINE: NEGATIVE
GLUCOSE, UA: 250 mg/dL — AB
KETONES UR: 15 mg/dL — AB
Leukocytes, UA: NEGATIVE
Nitrite: NEGATIVE
PH: 8 (ref 5.0–8.0)
Protein, ur: 30 mg/dL — AB
SPECIFIC GRAVITY, URINE: 1.01 (ref 1.005–1.030)
Urobilinogen, UA: 0.2 mg/dL (ref 0.0–1.0)

## 2014-01-14 LAB — RENAL FUNCTION PANEL
ANION GAP: 20 — AB (ref 5–15)
Albumin: 2.1 g/dL — ABNORMAL LOW (ref 3.5–5.2)
BUN: 59 mg/dL — AB (ref 6–23)
CHLORIDE: 106 meq/L (ref 96–112)
CO2: 18 mEq/L — ABNORMAL LOW (ref 19–32)
Calcium: 8.4 mg/dL (ref 8.4–10.5)
Creatinine, Ser: 11.88 mg/dL — ABNORMAL HIGH (ref 0.50–1.10)
GFR calc non Af Amer: 3 mL/min — ABNORMAL LOW (ref >90)
GFR, EST AFRICAN AMERICAN: 3 mL/min — AB (ref >90)
GLUCOSE: 102 mg/dL — AB (ref 70–99)
POTASSIUM: 5.4 meq/L — AB (ref 3.7–5.3)
Phosphorus: 9.3 mg/dL — ABNORMAL HIGH (ref 2.3–4.6)
Sodium: 144 mEq/L (ref 137–147)

## 2014-01-14 LAB — CBC
HCT: 31.4 % — ABNORMAL LOW (ref 36.0–46.0)
HEMOGLOBIN: 9.9 g/dL — AB (ref 12.0–15.0)
MCH: 28.6 pg (ref 26.0–34.0)
MCHC: 31.5 g/dL (ref 30.0–36.0)
MCV: 90.8 fL (ref 78.0–100.0)
Platelets: 269 10*3/uL (ref 150–400)
RBC: 3.46 MIL/uL — AB (ref 3.87–5.11)
RDW: 15.1 % (ref 11.5–15.5)
WBC: 11.1 10*3/uL — AB (ref 4.0–10.5)

## 2014-01-14 LAB — GLUCOSE, CAPILLARY: Glucose-Capillary: 122 mg/dL — ABNORMAL HIGH (ref 70–99)

## 2014-01-14 LAB — URINE MICROSCOPIC-ADD ON

## 2014-01-14 MED ORDER — WHITE PETROLATUM GEL
Status: AC
  Administered 2014-01-14: 12:00:00
  Filled 2014-01-14: qty 5

## 2014-01-14 NOTE — Procedures (Signed)
Pt seen on HD.  Ap 70 Vp 90.  BFR 250.  Will keep even as UO is excellent.  Baseline Scr from 5/15 was 0.9

## 2014-01-14 NOTE — Plan of Care (Signed)
Problem: Phase I Progression Outcomes Goal: Pain controlled with appropriate interventions Outcome: Completed/Met Date Met:  01/14/14     

## 2014-01-14 NOTE — Progress Notes (Signed)
[facility]  Daymon Larsenellie Cork ZOX:[id number] DOB: February 05, 1935 DOA: 01/08/2014 PCP: No primary care provider on file.  Brief narrative: 79 year old female w/ a hx of COPD and HTN who was transferred from [facility].  She presented to that emergency department with complaints of diarrhea for at least 3 days. Apparently she had reported abdominal pain associated with this diarrhea and decreasing urinary output with shortness of breath. No apparent history of chronic kidney disease but baseline BUN and creatinine were unavailable at time of admission.  Evaluation in [facility] was significant for acute renal failure w/ creatinine greater than 14 and potassium greater than 9. She was tx w/ medical therapies for this but potassium only decreased to 8. Her EKG showed widened QRS and subtly peaked T waves when compared to previous EKG from 2009. Because of her acuity and possible need for acute dialysis [facility]. Her initial chest x-ray demonstrated pulmonary edema.  After arrival she was evaluated by Nephrology who determined urgent hemodialysis was indicated. Temporary dialysis catheter was inserted by PCCM and she underwent urgent hemodialysis. Her potassium easily corrected with dialysis; her creatinine also decreased to 6. Postdialysis patient had issues with hypotension that required fluid challenges.   As of 11/30 patient's creatinine has begun to increase although she continues to have adequate urinary output. She also continues with symptoms of encephalopathy likely related to her progressive uremia. The patient's daughter has brought in most recent outpatient labs from May 2015 which revealed normal renal function BUN 17 and creatinine 0.90. Nephrology is hopeful her renal function will recover and she will not require lifelong dialysis.  HPI/Subjective: Endorses feels  "crummy" - confused but pleasant - easily redirected by her daughter  Assessment/Plan:  Acute renal failure / emergent Hyperkalemia w/ peaked T waves -baseline renal function unknown at presentation but as of 11/30 daughter brought in outpatient lab work from May 2015 that reveals creatinine 0.9 and BUN of 17 -K+ improved with initial HD tx 11/24 -Renal ultrasound demonstrated mild medical renal disease -SPEP and UPEP pending  -? Etiology due to diarrhea in setting of preadmit diuretics and anti HTN agents (ie ? Hypotension/ATN) -Nephrology following - tentative dialysis planned for 11/30  Acute respiratory failure with hypoxia / pulmonary edema -resp status remains somewhat tenuous in setting of worsening renal fxn  -Somewhat stable though on 2 L oxygen at the present moment - follow closely for worsening whch would necessitate recurrent emergent HD  -cont supportive care  L arm SVT -No DVT on venous duplex  - keep arm elevated as able  Hypotension -resolved  -cortisol within normal limits given presentation and appropriate stressors -No signif pericardial effusion noted on TTE -no evidence of sepsis / infection   Acute encephalopathy -due to ARF -Mental status waxes and wanes and likely related to progressive uremia   Diarrhea > Constipation  -no further diarrhea since admission - in fact pt now constipated  -C diff negative  -check KUB completed 11/29 without evidence of bowel obstruction or ileus - intake already very limited presently   COPD  -As of 11/29 patient was noted with modest wheezing, likely due to edema  -By 11/30 patient's lung sounds were clear to auscultation posteriorly and this may be related to patient increasing urinary output over the past 24 hours (see above) -was on Advair and Proair at home  Hx of HTN  -was  on Tenormin and Lasix at home  -Blood pressure stable off medications  Hypothyroidism -TSH normal, though not at goal of 1.0 for  replacement  -cont Synthroid  GERD  -Maximize protonix  DVT prophylaxis: Subcutaneous heparin  Code Status: Full Family Communication: daughter at bedside  Disposition Plan/Expected LOS: SDU  Consultants: Nephrology PCCM  Procedures: Insertion hemodialysis catheter 11/24 11/25 - TTE - EF 55% - grade 1 DD  Antibiotics: Rocephin 11/24 > 11/26  Objective: Blood pressure 136/77, pulse 116, temperature 98.5 F (36.9 C), temperature source Oral, resp. rate 19, height 5\' 8"  (1.727 m), weight 209 lb 14.1 oz (95.2 kg), SpO2 94 %.  Intake/Output Summary (Last 24 hours) at 01/14/14 1243 Last data filed at 01/14/14 1238  Gross per 24 hour  Intake    180 ml  Output   2625 ml  Net  -2445 ml   Exam: Gen: Awake and without any respiratory distress, mildly confused Chest: Clear to auscultation posteriorly without any wheezing, 2 L oxygen Cardiac: Regular tachycardic rate and rhythm, no appreciable M - distant HS  Abdomen: Soft, nondistended, without obvious hepatosplenomegaly, no ascites, bowel sounds + Extremities: L arm w/ 1+ edema w/ 1+ radial pulse,- 1+ B LE edema   Scheduled Meds:  Scheduled Meds: . antiseptic oral rinse  7 mL Mouth Rinse BID  . aspirin EC  81 mg Oral Daily  . folic acid  1 mg Oral Daily  . heparin  5,000 Units Subcutaneous 3 times per day  . ipratropium-albuterol  3 mL Nebulization TID  . latanoprost  1 drop Both Eyes QHS  . levothyroxine  25 mcg Oral QAC breakfast  . multivitamin with minerals  1 tablet Oral Daily  . pantoprazole  40 mg Oral BID  . pilocarpine  1 drop Right Eye QID  . senna-docusate  1 tablet Oral BID  . thiamine  100 mg Oral Daily  . white petrolatum       Data Reviewed: Basic Metabolic Panel:  Recent Labs Lab 01/10/14 0500 01/11/14 0535 01/12/14 0203 01/13/14 0500 01/14/14 0430  NA 141 141 140 142 144  K 4.2 4.8 5.3 5.4* 5.4*  CL 104 104 104 102 106  CO2 21 19 16* 18* 18*  GLUCOSE 88 82 77 94 102*  BUN 49* 50* 53* 58*  59*  CREATININE 8.99* 9.89* 10.46* 12.00* 11.88*  CALCIUM 7.3* 7.0* 7.0* 7.7* 8.4  MG  --  1.8  --   --   --   PHOS  --   --  8.2*  --  9.3*     Liver Function Tests:  Recent Labs Lab 01/08/14 2200 01/09/14 0319 01/10/14 0500 01/11/14 0535 01/12/14 0203 01/13/14 0500 01/14/14 0430  AST 8 7 6 8   --  6  --   ALT 7 6 6 6   --  6  --   ALKPHOS 85 73 67 66  --  66  --   BILITOT <0.2* <0.2* <0.2* <0.2*  --  <0.2*  --   PROT 7.1 6.3 6.1 6.0  --  6.4  --   ALBUMIN 2.6* 2.4* 2.1* 2.0* 2.0* 2.1* 2.1*    Recent Labs Lab 01/10/14 0500  LIPASE 19   CBC:  Recent Labs Lab 01/08/14 2007  01/10/14 0500 01/11/14 0535 01/12/14 0203 01/13/14 0500 01/14/14 0430  WBC 10.9*  < > 9.1 9.0 11.4* 11.8* 11.1*  NEUTROABS 8.0*  --  5.2 5.4  --   --   --   HGB 12.9  < >  10.1* 9.7* 10.3* 10.1* 9.9*  HCT 40.4  < > 30.7* 30.9* 31.7* 31.9* 31.4*  MCV 90.8  < > 91.1 90.4 90.6 92.2 90.8  PLT 253  < > 223 233 236 281 269  < > = values in this interval not displayed. CBG:  Recent Labs Lab 01/10/14 0800 01/11/14 0739 01/12/14 0808 01/13/14 0806 01/14/14 0817  GLUCAP 102* 84 77 98 122*    Recent Results (from the past 240 hour(s))  MRSA PCR Screening     Status: None   Collection Time: 01/08/14  8:38 PM  Result Value Ref Range Status   MRSA by PCR NEGATIVE NEGATIVE Final    Comment:        The GeneXpert MRSA Assay (FDA approved for NASAL specimens only), is one component of a comprehensive MRSA colonization surveillance program. It is not intended to diagnose MRSA infection nor to guide or monitor treatment for MRSA infections.   Urine culture     Status: None   Collection Time: 01/08/14 10:28 PM  Result Value Ref Range Status   Specimen Description URINE, CATHETERIZED  Final   Special Requests NONE  Final   Culture  Setup Time   Final    01/08/2014 23:11 Performed at [facility]    Colony Count NO GROWTH Performed at [facility]   Final   Culture NO  GROWTH Performed at [facility]   Final   Report Status 01/10/2014 FINAL  Final  Culture, blood (routine x 2)     Status: None (Preliminary result)   Collection Time: 01/09/14  1:25 PM  Result Value Ref Range Status   Specimen Description BLOOD LEFT ANTECUBITAL  Final   Special Requests BOTTLES DRAWN AEROBIC AND ANAEROBIC 10CC  Final   Culture  Setup Time   Final    01/09/2014 18:02 Performed at [facility]    Culture   Final           BLOOD CULTURE RECEIVED NO GROWTH TO DATE CULTURE WILL BE HELD FOR 5 DAYS BEFORE ISSUING A FINAL NEGATIVE REPORT Performed at [facility]    Report Status PENDING  Incomplete  Culture, blood (routine x 2)     Status: None (Preliminary result)   Collection Time: 01/09/14  1:30 PM  Result Value Ref Range Status   Specimen Description BLOOD RIGHT IV START  Final   Special Requests BOTTLES DRAWN AEROBIC AND ANAEROBIC 10CC  Final   Culture  Setup Time   Final    01/09/2014 18:02 Performed at [facility]    Culture   Final           BLOOD CULTURE RECEIVED NO GROWTH TO DATE CULTURE WILL BE HELD FOR 5 DAYS BEFORE ISSUING A FINAL NEGATIVE REPORT Performed at [facility]    Report Status PENDING  Incomplete  Clostridium Difficile by PCR     Status: None   Collection Time: 01/09/14  1:50 PM  Result Value Ref Range Status   C difficile by pcr NEGATIVE NEGATIVE Final     Studies:  Recent x-ray studies have been reviewed in detail by the Attending Physician  Time spent :  35mins  Junious SilkAllison Ellis, ANP [facility] For Consults/Admissions - Flow Manager - [phone] Office  [phone] Pager [phone]  On-Call/Text Page:      [email or web address]      password [email or web address]  01/14/2014, 12:43 PM   LOS: 6 days    I have personally examined this  patient and reviewed the entire database. I have reviewed the above note, made any necessary editorial changes, and agree with its content.  Lonia Blood,  MD [facility]

## 2014-01-14 NOTE — Progress Notes (Signed)
S: appetite poor.  Np pain.  Bowels moved yest O:BP 122/85 mmHg  Pulse 116  Temp(Src) 98.7 F (37.1 C) (Oral)  Resp 19  Ht 5\' 8"  (1.727 m)  Wt 95.2 kg (209 lb 14.1 oz)  BMI 31.92 kg/m2  SpO2 94%  Intake/Output Summary (Last 24 hours) at 01/14/14 0810 Last data filed at 01/14/14 0407  Gross per 24 hour  Intake    180 ml  Output   1775 ml  Net  -1595 ml   Weight change: -6.7 kg (-14 lb 12.3 oz) Gen: Awake and alert CVS: Tachy, reg Resp: Decreased BS throughout Abd: + BS NT, Mild distention Ext: trace edema NEURO:    CNI.  + asterixis  . antiseptic oral rinse  7 mL Mouth Rinse BID  . aspirin EC  81 mg Oral Daily  . folic acid  1 mg Oral Daily  . heparin  5,000 Units Subcutaneous 3 times per day  . ipratropium-albuterol  3 mL Nebulization TID  . latanoprost  1 drop Both Eyes QHS  . levothyroxine  25 mcg Oral QAC breakfast  . multivitamin with minerals  1 tablet Oral Daily  . pantoprazole  40 mg Oral BID  . pilocarpine  1 drop Right Eye QID  . senna-docusate  1 tablet Oral BID  . thiamine  100 mg Oral Daily   Dg Abd Portable 1v  01/13/2014   CLINICAL DATA:  Generalized abdominal pain, nausea.  EXAM: PORTABLE ABDOMEN - 1 VIEW  COMPARISON:  January 08, 2014.  FINDINGS: The bowel gas pattern is normal. No radio-opaque calculi or other significant radiographic abnormality are seen.  IMPRESSION: No evidence of bowel obstruction or ileus.   Electronically Signed   By: Roque LiasJames  Green M.D.   On: 01/13/2014 18:18   BMET    Component Value Date/Time   NA 144 01/14/2014 0430   K 5.4* 01/14/2014 0430   CL 106 01/14/2014 0430   CO2 18* 01/14/2014 0430   GLUCOSE 102* 01/14/2014 0430   BUN 59* 01/14/2014 0430   CREATININE 11.88* 01/14/2014 0430   CALCIUM 8.4 01/14/2014 0430   GFRNONAA 3* 01/14/2014 0430   GFRAA 3* 01/14/2014 0430   CBC    Component Value Date/Time   WBC 11.1* 01/14/2014 0430   RBC 3.46* 01/14/2014 0430   HGB 9.9* 01/14/2014 0430   HCT 31.4* 01/14/2014 0430    PLT 269 01/14/2014 0430   MCV 90.8 01/14/2014 0430   MCH 28.6 01/14/2014 0430   MCHC 31.5 01/14/2014 0430   RDW 15.1 01/14/2014 0430   LYMPHSABS 1.3 01/11/2014 0535   MONOABS 1.1* 01/11/2014 0535   EOSABS 1.1* 01/11/2014 0535   BASOSABS 0.1 01/11/2014 0535     Assessment: 1.  Presumably ARF with good UO.  Daughter to call PCP and get labs that were done in April 2.  COPD 3. Hx HTN 4. Mild hyperkalemia 5. Metabolic and resp acidosis  Plan: 1.  HD today.  Her UO is excellent so hopefully this implies that renal fx will recover. 2. Daily labs 3. Recheck UA   Danika Kluender T

## 2014-01-15 DIAGNOSIS — E876 Hypokalemia: Secondary | ICD-10-CM | POA: Insufficient documentation

## 2014-01-15 DIAGNOSIS — J810 Acute pulmonary edema: Secondary | ICD-10-CM | POA: Insufficient documentation

## 2014-01-15 LAB — CULTURE, BLOOD (ROUTINE X 2)
Culture: NO GROWTH
Culture: NO GROWTH

## 2014-01-15 LAB — PROTEIN ELECTROPHORESIS, SERUM
Albumin ELP: 42 % — ABNORMAL LOW (ref 55.8–66.1)
Alpha-1-Globulin: 9.2 % — ABNORMAL HIGH (ref 2.9–4.9)
Alpha-2-Globulin: 15 % — ABNORMAL HIGH (ref 7.1–11.8)
BETA 2: 7 % — AB (ref 3.2–6.5)
Beta Globulin: 5 % (ref 4.7–7.2)
GAMMA GLOBULIN: 21.8 % — AB (ref 11.1–18.8)
M-SPIKE, %: NOT DETECTED g/dL
Total Protein ELP: 6.3 g/dL (ref 6.0–8.3)

## 2014-01-15 LAB — RENAL FUNCTION PANEL
ANION GAP: 14 (ref 5–15)
Albumin: 1.9 g/dL — ABNORMAL LOW (ref 3.5–5.2)
BUN: 41 mg/dL — ABNORMAL HIGH (ref 6–23)
CHLORIDE: 105 meq/L (ref 96–112)
CO2: 24 mEq/L (ref 19–32)
Calcium: 8.5 mg/dL (ref 8.4–10.5)
Creatinine, Ser: 7.83 mg/dL — ABNORMAL HIGH (ref 0.50–1.10)
GFR calc Af Amer: 5 mL/min — ABNORMAL LOW (ref >90)
GFR, EST NON AFRICAN AMERICAN: 4 mL/min — AB (ref >90)
Glucose, Bld: 91 mg/dL (ref 70–99)
PHOSPHORUS: 6.9 mg/dL — AB (ref 2.3–4.6)
POTASSIUM: 5.1 meq/L (ref 3.7–5.3)
SODIUM: 143 meq/L (ref 137–147)

## 2014-01-15 LAB — GLUCOSE, CAPILLARY: GLUCOSE-CAPILLARY: 100 mg/dL — AB (ref 70–99)

## 2014-01-15 MED ORDER — HEPARIN SODIUM (PORCINE) 1000 UNIT/ML DIALYSIS: 1000.0000 [IU] | INTRAMUSCULAR | Status: DC | PRN

## 2014-01-15 MED ORDER — NEPRO/CARBSTEADY PO LIQD: 237.0000 mL | ORAL | Status: DC | PRN

## 2014-01-15 MED ORDER — SODIUM CHLORIDE 0.9 % IV SOLN: 100.0000 mL | INTRAVENOUS | Status: DC | PRN

## 2014-01-15 MED ORDER — LIDOCAINE-PRILOCAINE 2.5-2.5 % EX CREA: 1.0000 "application " | TOPICAL_CREAM | CUTANEOUS | Status: DC | PRN

## 2014-01-15 MED ORDER — LIDOCAINE HCL (PF) 1 % IJ SOLN: 5.0000 mL | INTRAMUSCULAR | Status: DC | PRN

## 2014-01-15 MED ORDER — PENTAFLUOROPROP-TETRAFLUOROETH EX AERO: 1.0000 "application " | INHALATION_SPRAY | CUTANEOUS | Status: DC | PRN

## 2014-01-15 MED ORDER — ALTEPLASE 2 MG IJ SOLR: 2.0000 mg | Freq: Once | INTRAMUSCULAR | Status: AC | PRN

## 2014-01-15 NOTE — Progress Notes (Signed)
Pt and daughter refusing bed alarm at this time. Provided education regarding safety and risks for falls. Pt and daughter verbalized understanding and still refused. Will continue to monitor with door open.

## 2014-01-15 NOTE — Plan of Care (Signed)
Problem: Phase I Progression Outcomes Goal: OOB as tolerated unless otherwise ordered Outcome: Progressing Goal: Initial discharge plan identified Outcome: Progressing Goal: Voiding-avoid urinary catheter unless indicated Outcome: Progressing Goal: Hemodynamically stable Outcome: Completed/Met Date Met:  01/15/14

## 2014-01-15 NOTE — Progress Notes (Signed)
[facility]  Daymon Larsenellie Gasaway ZOX:[id number] DOB: 1934-03-29 DOA: 01/08/2014 PCP: No primary care provider on file.  Brief narrative: 79 year old WF PMHx COPD and HTN who was transferred from [facility].  She presented to that emergency department with complaints of diarrhea for at least 3 days. Apparently she had reported abdominal pain associated with this diarrhea and decreasing urinary output with shortness of breath. No apparent history of chronic kidney disease but baseline BUN and creatinine were unavailable at time of admission.  Evaluation in the emergency department at [facility] was significant for acute renal failure w/ creatinine greater than 14 and potassium greater than 9. She was tx w/ medical therapies for this but potassium only decreased to 8. Her EKG showed widened QRS and subtly peaked T waves when compared to previous EKG from 2009. Because of her acuity and possible need for acute dialysis she was [facility]. Her initial chest x-ray demonstrated pulmonary edema.  After arrival she was evaluated by Nephrology who determined urgent hemodialysis was indicated. Temporary dialysis catheter was inserted by PCCM and she underwent urgent hemodialysis. Her potassium easily corrected with dialysis; her creatinine also decreased to 6. Postdialysis patient had issues with hypotension that required fluid challenges.   As of 11/30 patient's creatinine has begun to increase although she continues to have adequate urinary output. She also continues with symptoms of encephalopathy likely related to her progressive uremia. The patient's daughter has brought in most recent outpatient labs from May 2015 which revealed normal renal function BUN 17 and creatinine 0.90. Nephrology is hopeful her renal function will recover and she will not require lifelong dialysis.  HPI/Subjective: Awake- poor appetite-mildly  confused  Assessment/Plan:  Acute renal failure / emergent Hyperkalemia w/ peaked T waves -baseline renal function unknown at presentation but as of 11/30 daughter brought in outpatient lab work from May 2015 that reveals creatinine 0.9 and BUN of 17 -K+ improved with initial HD tx 11/24 -Renal ultrasound demonstrated mild medical renal disease -SPEP and UPEP pending  -? Etiology due to diarrhea in setting of preadmit diuretics and anti HTN agents (ie ? Hypotension/ATN) -Nephrology following - may require additional HD tx's  Acute respiratory failure with hypoxia / pulmonary edema -resp status has stabilized-on 2 L oxygen at the present moment - follow closely for worsening whch would necessitate recurrent emergent HD  -cont supportive care  L arm SVT -No DVT on venous duplex  - keep arm elevated as able  Hypotension -resolved  -cortisol within normal limits given presentation and appropriate stressors -No signif pericardial effusion noted on TTE -no evidence of sepsis / infection   Acute encephalopathy -due to ARF -Mental status waxes and wanes and likely related to progressive uremia -seems better as of 12/1  Diarrhea > Constipation  -no further diarrhea since admission - in fact pt now constipated  -C diff negative  -check KUB completed 11/29 without evidence of bowel obstruction or ileus - intake already very limited presently   COPD  -As of 11/29 patient was noted with modest wheezing, likely due to edema  -By 11/30 patient's lung sounds were clear to auscultation posteriorly and this may be related to patient increasing urinary output over the past 24 hours (see above) -was on Advair and Proair at home  Hx of HTN  -was on Tenormin and Lasix at home  -Blood pressure stable off medications  Hypothyroidism -TSH normal, though not at goal  of 1.0 for replacement  -cont Synthroid  GERD  -Maximize protonix  DVT prophylaxis: Subcutaneous heparin  Code Status:  Full Family Communication: spoke w/ daughter at bedside  Disposition Plan/Expected LOS: Transfer to renal floor  Consultants: Nephrology PCCM  Procedures: Insertion hemodialysis catheter 11/24 11/25 - TTE - EF 55% - grade 1 DD  Antibiotics: Rocephin 11/24 > 11/26  Objective: Blood pressure 126/83, pulse 123, temperature 98.5 F (36.9 C), temperature source Oral, resp. rate 28, height 5\' 8"  (1.727 m), weight 210 lb 15.7 oz (95.7 kg), SpO2 97 %.  Intake/Output Summary (Last 24 hours) at 01/15/14 1024 Last data filed at 01/15/14 0900  Gross per 24 hour  Intake    300 ml  Output   2025 ml  Net  -1725 ml   Exam: Gen: Awake, no respiratory distress, mildly confused Chest: Clear to auscultation posteriorly without any wheezing, 2 L oxygen Cardiac: Regular tachycardic rate and rhythm, no R, M or G- distant HS  Abdomen: Soft, nondistended, without obvious hepatosplenomegaly, no ascites, bowel sounds + Extremities: L arm w/ 1+ edema w/ 1+ radial pulse,- 1+ B LE edema   Scheduled Meds:  Scheduled Meds: . antiseptic oral rinse  7 mL Mouth Rinse BID  . aspirin EC  81 mg Oral Daily  . folic acid  1 mg Oral Daily  . heparin  5,000 Units Subcutaneous 3 times per day  . latanoprost  1 drop Both Eyes QHS  . levothyroxine  25 mcg Oral QAC breakfast  . multivitamin with minerals  1 tablet Oral Daily  . pantoprazole  40 mg Oral BID  . pilocarpine  1 drop Right Eye QID  . senna-docusate  1 tablet Oral BID  . thiamine  100 mg Oral Daily   Data Reviewed: Basic Metabolic Panel:  Recent Labs Lab 01/11/14 0535 01/12/14 0203 01/13/14 0500 01/14/14 0430 01/15/14 0535  NA 141 140 142 144 143  K 4.8 5.3 5.4* 5.4* 5.1  CL 104 104 102 106 105  CO2 19 16* 18* 18* 24  GLUCOSE 82 77 94 102* 91  BUN 50* 53* 58* 59* 41*  CREATININE 9.89* 10.46* 12.00* 11.88* 7.83*  CALCIUM 7.0* 7.0* 7.7* 8.4 8.5  MG 1.8  --   --   --   --   PHOS  --  8.2*  --  9.3* 6.9*     Liver Function  Tests:  Recent Labs Lab 01/08/14 2200 01/09/14 0319 01/10/14 0500 01/11/14 0535 01/12/14 0203 01/13/14 0500 01/14/14 0430 01/15/14 0535  AST 8 7 6 8   --  6  --   --   ALT 7 6 6 6   --  6  --   --   ALKPHOS 85 73 67 66  --  66  --   --   BILITOT <0.2* <0.2* <0.2* <0.2*  --  <0.2*  --   --   PROT 7.1 6.3 6.1 6.0  --  6.4  --   --   ALBUMIN 2.6* 2.4* 2.1* 2.0* 2.0* 2.1* 2.1* 1.9*    Recent Labs Lab 01/10/14 0500  LIPASE 19   CBC:  Recent Labs Lab 01/08/14 2007  01/10/14 0500 01/11/14 0535 01/12/14 0203 01/13/14 0500 01/14/14 0430  WBC 10.9*  < > 9.1 9.0 11.4* 11.8* 11.1*  NEUTROABS 8.0*  --  5.2 5.4  --   --   --   HGB 12.9  < > 10.1* 9.7* 10.3* 10.1* 9.9*  HCT 40.4  < > 30.7* 30.9*  31.7* 31.9* 31.4*  MCV 90.8  < > 91.1 90.4 90.6 92.2 90.8  PLT 253  < > 223 233 236 281 269  < > = values in this interval not displayed. CBG:  Recent Labs Lab 01/11/14 0739 01/12/14 0808 01/13/14 0806 01/14/14 0817 01/15/14 0758  GLUCAP 84 77 98 122* 100*    Recent Results (from the past 240 hour(s))  MRSA PCR Screening     Status: None   Collection Time: 01/08/14  8:38 PM  Result Value Ref Range Status   MRSA by PCR NEGATIVE NEGATIVE Final    Comment:        The GeneXpert MRSA Assay (FDA approved for NASAL specimens only), is one component of a comprehensive MRSA colonization surveillance program. It is not intended to diagnose MRSA infection nor to guide or monitor treatment for MRSA infections.   Urine culture     Status: None   Collection Time: 01/08/14 10:28 PM  Result Value Ref Range Status   Specimen Description URINE, CATHETERIZED  Final   Special Requests NONE  Final   Culture  Setup Time   Final    01/08/2014 23:11 Performed at [facility]    Colony Count NO GROWTH Performed at [facility]   Final   Culture NO GROWTH Performed at [facility]   Final   Report Status 01/10/2014 FINAL  Final  Culture, blood (routine x 2)      Status: None   Collection Time: 01/09/14  1:25 PM  Result Value Ref Range Status   Specimen Description BLOOD LEFT ANTECUBITAL  Final   Special Requests BOTTLES DRAWN AEROBIC AND ANAEROBIC 10CC  Final   Culture  Setup Time   Final    01/09/2014 18:02 Performed at [facility]    Culture   Final    NO GROWTH 5 DAYS Performed at [facility]    Report Status 01/15/2014 FINAL  Final  Culture, blood (routine x 2)     Status: None   Collection Time: 01/09/14  1:30 PM  Result Value Ref Range Status   Specimen Description BLOOD RIGHT IV START  Final   Special Requests BOTTLES DRAWN AEROBIC AND ANAEROBIC 10CC  Final   Culture  Setup Time   Final    01/09/2014 18:02 Performed at [facility]    Culture   Final    NO GROWTH 5 DAYS Performed at [facility]    Report Status 01/15/2014 FINAL  Final  Clostridium Difficile by PCR     Status: None   Collection Time: 01/09/14  1:50 PM  Result Value Ref Range Status   C difficile by pcr NEGATIVE NEGATIVE Final     Studies:  Recent x-ray studies have been reviewed in detail by the Attending Physician  Time spent :   Junious Silk, ANP [facility] For Consults/Admissions - Flow Manager - [phone] Office  [phone] Pager [phone]  On-Call/Text Page:      [email or web address]      password [facility]  01/15/2014, 10:24 AM   LOS: 7 days    Examined patient and discussed assessment plan with ANP Revonda Standard and agree with above plan. Patient with multiple complex medical problems> 40 minutes spent in direct patient care

## 2014-01-15 NOTE — Progress Notes (Signed)
Transfer note:  Arrival Method: bed   Mental Orientation: A&O to person, place.  Disoriented to time and events, lethargic.  Telemetry: Placed on telemetry box 24, CCMD notified.  Sinus tach with frequent PVC's. Assessment: See Doc Flowsheets Skin: Ecchymosis noted on abdomen and arms IV: right IJ dialysis catheter with pigtail, saline locked.  Pain: denies (patient drowsy but responds to voice)  Tubes: 14Fr Foley Catheter in place Safety Measures: Educated patient and daughter (at bedside) on fall precautions. Non-slip socks on patient.  Bed alarm on.  6700 Orientation: Patient has been oriented to the unit, staff and to the room.

## 2014-01-15 NOTE — Progress Notes (Signed)
S: Ate a little breakfast.  NO N/V O:BP 126/83 mmHg  Pulse 123  Temp(Src) 98.5 F (36.9 C) (Oral)  Resp 28  Ht 5\' 8"  (1.727 m)  Wt 95.7 kg (210 lb 15.7 oz)  BMI 32.09 kg/m2  SpO2 97%  Intake/Output Summary (Last 24 hours) at 01/15/14 [id number] Last data filed at 01/15/14 0900  Gross per 24 hour  Intake    300 ml  Output   2025 ml  Net  -1725 ml   Weight change: 0.5 kg (1 lb 1.6 oz) Gen: Awake and alert CVS: Tachy, reg Resp: Decreased BS throughout, rare wheeze Abd: + BS NTND Ext: trace edema NEURO:    CNI.  + asterixis  . antiseptic oral rinse  7 mL Mouth Rinse BID  . aspirin EC  81 mg Oral Daily  . folic acid  1 mg Oral Daily  . heparin  5,000 Units Subcutaneous 3 times per day  . latanoprost  1 drop Both Eyes QHS  . levothyroxine  25 mcg Oral QAC breakfast  . multivitamin with minerals  1 tablet Oral Daily  . pantoprazole  40 mg Oral BID  . pilocarpine  1 drop Right Eye QID  . senna-docusate  1 tablet Oral BID  . thiamine  100 mg Oral Daily   Dg Abd Portable 1v  01/13/2014   CLINICAL DATA:  Generalized abdominal pain, nausea.  EXAM: PORTABLE ABDOMEN - 1 VIEW  COMPARISON:  January 08, 2014.  FINDINGS: The bowel gas pattern is normal. No radio-opaque calculi or other significant radiographic abnormality are seen.  IMPRESSION: No evidence of bowel obstruction or ileus.   Electronically Signed   By: Roque LiasJames  Green M.D.   On: 01/13/2014 18:18   BMET    Component Value Date/Time   NA 143 01/15/2014 0535   K 5.1 01/15/2014 0535   CL 105 01/15/2014 0535   CO2 24 01/15/2014 0535   GLUCOSE 91 01/15/2014 0535   BUN 41* 01/15/2014 0535   CREATININE 7.83* 01/15/2014 0535   CALCIUM 8.5 01/15/2014 0535   GFRNONAA 4* 01/15/2014 0535   GFRAA 5* 01/15/2014 0535   CBC    Component Value Date/Time   WBC 11.1* 01/14/2014 0430   RBC 3.46* 01/14/2014 0430   HGB 9.9* 01/14/2014 0430   HCT 31.4* 01/14/2014 0430   PLT 269 01/14/2014 0430   MCV 90.8 01/14/2014 0430   MCH 28.6  01/14/2014 0430   MCHC 31.5 01/14/2014 0430   RDW 15.1 01/14/2014 0430   LYMPHSABS 1.3 01/11/2014 0535   MONOABS 1.1* 01/11/2014 0535   EOSABS 1.1* 01/11/2014 0535   BASOSABS 0.1 01/11/2014 0535     Assessment: 1.  Presumably ARF with good UO.  Renal fx nl with Scr 0.9 last May 2.  COPD 3. Hx HTN 4. Mild hyperkalemia, improved 5. Metabolic and resp acidosis  Plan: 1.  Will play things day by day and hopefully renal fx will recover though she may require more HD 2. Daily labs 3. Could advance diet to renal    Makenly Larabee T

## 2014-01-16 DIAGNOSIS — K219 Gastro-esophageal reflux disease without esophagitis: Secondary | ICD-10-CM

## 2014-01-16 LAB — RENAL FUNCTION PANEL
ALBUMIN: 2.1 g/dL — AB (ref 3.5–5.2)
Anion gap: 11 (ref 5–15)
BUN: 46 mg/dL — AB (ref 6–23)
CHLORIDE: 112 meq/L (ref 96–112)
CO2: 24 mEq/L (ref 19–32)
CREATININE: 8.18 mg/dL — AB (ref 0.50–1.10)
Calcium: 8.7 mg/dL (ref 8.4–10.5)
GFR calc Af Amer: 5 mL/min — ABNORMAL LOW (ref >90)
GFR, EST NON AFRICAN AMERICAN: 4 mL/min — AB (ref >90)
Glucose, Bld: 94 mg/dL (ref 70–99)
Phosphorus: 7 mg/dL — ABNORMAL HIGH (ref 2.3–4.6)
Potassium: 4.9 mEq/L (ref 3.7–5.3)
Sodium: 147 mEq/L (ref 137–147)

## 2014-01-16 LAB — UIFE/LIGHT CHAINS/TP QN, 24-HR UR
ALPHA 2 UR: DETECTED — AB
Albumin, U: DETECTED
Albumin, U: DETECTED
Alpha 1, Urine: DETECTED — AB
Alpha 1, Urine: DETECTED — AB
Alpha 2, Urine: DETECTED — AB
BETA UR: DETECTED — AB
Beta, Urine: DETECTED — AB
Gamma Globulin, Urine: DETECTED — AB
Gamma Globulin, Urine: DETECTED — AB
Total Protein, Urine: 83 mg/dL — ABNORMAL HIGH (ref 5–24)
Total Protein, Urine: 79 mg/dL — ABNORMAL HIGH (ref 5–24)

## 2014-01-16 MED ORDER — SODIUM CHLORIDE 0.9 % IJ SOLN
10.0000 mL | INTRAMUSCULAR | Status: DC | PRN
  Administered 2014-01-16 (×2): 10 mL
  Administered 2014-01-17: 20 mL
  Administered 2014-01-18 – 2014-01-19 (×2): 10 mL
  Administered 2014-01-20: 40 mL
  Filled 2014-01-16 (×5): qty 40

## 2014-01-16 MED ORDER — NEPRO/CARBSTEADY PO LIQD
237.0000 mL | Freq: Two times a day (BID) | ORAL | Status: DC
  Administered 2014-01-16 – 2014-01-21 (×11): 237 mL via ORAL

## 2014-01-16 NOTE — Progress Notes (Signed)
[facility]                                                                              Patient Demographics  [name], is a 79 y.o. female, DOB - 1934-10-24, GNF:[id number]  Admit date - 01/08/2014   Admitting Physician Marinda Elk, MD  Outpatient Primary MD for the patient is No primary care provider on file.  LOS - 8   No chief complaint on file.     HPI on 01/08/2014 by Dr. Freda Munro  [name] is a 79 y.o. female presents as a transfer from [employer] [facility]. Not a reliable historian and no family is present. Patient apparently has a past history of COPD presented to the ED with complaints of diarrhea. She states that this has been going on for a while. She states that she has had some abdominal pain associated. She also notes decreased urine output. She admits to having shortness of breath, She has no prior history of kidney disease. At [facility] she was noted to have hyperkalemia with a potassium of 9 this was aggressively treated and came down minimally to a final reading of 8 prior to transfer. Patient also was noted to have a creatinine of 14 and therefore she was transferred to [facility] for further management.  Interim history Nephrology was consulted and determined patient needed urgent hemodialysis. Temporary dialysis catheter was inserted by PCCM and patient underwent hemodialysis. Her potassium was corrected with dialysis, her creatinine also decreased to 6. Postdialysis, patient had issues with hypotension and required fluid challenges. On 11:30 patient's creatinine began to increase again, patient did have adequate urine output and continues to do so. She continued to have symptoms of encephalopathy which is likely secondary to her uremia. Nephrology is hopeful the patient's renal function will recover and patient will not R lifelong dialysis  Assessment & Plan   Acute renal failure/questionable ATN/hyperkalemia -Unknown etiology, possibly dehydration, diarrhea,  diuretic use -Baseline creatinine per daughter in May 2015 0.9 with a BUN of 17 -Hyperkalemia has resolved will continue to monitor BMP -Renal ultrasound showed mild medical renal disease -Creatinine today 8.18  Acute respiratory failure with hypoxia/pulmonary edema -Improved -Currently on 2 L oxygen -Per daughter at bedside, patient does require oxygen at home with ambulation -Will continue to monitor  Left upper extremity SVT -No DVT noted on Doppler -Continue conservative management, keep arm elevated  Hypotension -Resolved, no evidence of sepsis or infection -Echocardiogram showed no significant pericardial effusion  Acute encephalopathy -Resolved, per daughter patient is at her baseline. -Likely secondary to acute renal failure with uremia  Diarrhea/constipation -Patient had diarrhea before admission however now appears to be constipated -C. difficile PCR negative -KUB on 01/13/2014 showed no evidence of bowel obstruction or ileus -Continue bowel regimen  COPD/cough -Appears stable  Hypertension -Patient was on Tenormin and Lasix at home, which have been held during her hospitalization  Hypothyroidism -TSH 1.8 -Continue Synthroid  GERD -Continue Protonix  Deconditioning -PT consulted and recommended nursing home placement  Code Status: Full  Family Communication: Daughter at bedside  Disposition Plan: Admitted  Time Spent in minutes   30 minutes  Procedures  Insertion of hemodialysis catheter 01/08/2014 Echocardiogram on  01/09/2014: EF 55%, grade 1 diastolic dysfunction  Consults   PCCM Nephrology  DVT Prophylaxis  Heparin  Lab Results  Component Value Date   PLT 269 01/14/2014    Medications  Scheduled Meds: . antiseptic oral rinse  7 mL Mouth Rinse BID  . aspirin EC  81 mg Oral Daily  . feeding supplement (NEPRO CARB STEADY)  237 mL Oral BID BM  . folic acid  1 mg Oral Daily  . heparin  5,000 Units Subcutaneous 3 times per day  .  latanoprost  1 drop Both Eyes QHS  . levothyroxine  25 mcg Oral QAC breakfast  . multivitamin with minerals  1 tablet Oral Daily  . pantoprazole  40 mg Oral BID  . pilocarpine  1 drop Right Eye QID  . senna-docusate  1 tablet Oral BID  . thiamine  100 mg Oral Daily   Continuous Infusions:  PRN Meds:.sodium chloride, sodium chloride, acetaminophen **OR** acetaminophen, albuterol, feeding supplement (NEPRO CARB STEADY), heparin, lidocaine (PF), lidocaine-prilocaine, ondansetron **OR** ondansetron (ZOFRAN) IV, pentafluoroprop-tetrafluoroeth, sodium chloride  Antibiotics    Anti-infectives    Start     Dose/Rate Route Frequency Ordered Stop   01/08/14 1945  cefTRIAXone (ROCEPHIN) 1 g in dextrose 5 % 50 mL IVPB - Premix  Status:  Discontinued     1 g100 mL/hr over 30 Minutes Intravenous Every 24 hours 01/08/14 1933 01/11/14 1854        Subjective:   [name] seen and examined today.  Patient states she's feeling better as compared to previously. Her daughter was at bedside, the patient had a decent night. She has had good urine output as well. Patient currently denies chest shortness of breath, abdominal pain. She does state that she has cough from time to time.  Objective:   Filed Vitals:   01/15/14 2100 01/16/14 0500 01/16/14 0912 01/16/14 1031  BP: 124/59 128/59 141/82   Pulse: 109 113 123   Temp: 99.4 F (37.4 C) 98.9 F (37.2 C) 98.5 F (36.9 C)   TempSrc: Oral Oral Oral   Resp: 18 18 19    Height:      Weight: 95.7 kg (210 lb 15.7 oz)     SpO2: 99% 99% 97% 98%    Wt Readings from Last 3 Encounters:  01/15/14 95.7 kg (210 lb 15.7 oz)     Intake/Output Summary (Last 24 hours) at 01/16/14 1206 Last data filed at 01/16/14 0500  Gross per 24 hour  Intake    120 ml  Output   1750 ml  Net  -1630 ml    Exam  General: Well developed, well nourished, NAD, appears stated age  HEENT: NCAT,  mucous membranes moist.   Neck: Supple, right IJ HD  cath  Cardiovascular: S1 S2 auscultated, no rubs, murmurs or gallops. Regular rate and rhythm.  Respiratory: Diminished but clear breath sounds  Abdomen: Soft, nontender, nondistended, + bowel sounds  Extremities: warm dry without cyanosis clubbing. Trace LE edema B/L  Neuro: AAOx3, no focal deficits  Psych: Appropriate  Data Review   Micro Results Recent Results (from the past 240 hour(s))  MRSA PCR Screening     Status: None   Collection Time: 01/08/14  8:38 PM  Result Value Ref Range Status   MRSA by PCR NEGATIVE NEGATIVE Final    Comment:        The GeneXpert MRSA Assay (FDA approved for NASAL specimens only), is one component of a comprehensive MRSA colonization surveillance program. It is  not intended to diagnose MRSA infection nor to guide or monitor treatment for MRSA infections.   Urine culture     Status: None   Collection Time: 01/08/14 10:28 PM  Result Value Ref Range Status   Specimen Description URINE, CATHETERIZED  Final   Special Requests NONE  Final   Culture  Setup Time   Final    01/08/2014 23:11 Performed at [facility]    Colony Count NO GROWTH Performed at [facility]   Final   Culture NO GROWTH Performed at [facility]   Final   Report Status 01/10/2014 FINAL  Final  Culture, blood (routine x 2)     Status: None   Collection Time: 01/09/14  1:25 PM  Result Value Ref Range Status   Specimen Description BLOOD LEFT ANTECUBITAL  Final   Special Requests BOTTLES DRAWN AEROBIC AND ANAEROBIC 10CC  Final   Culture  Setup Time   Final    01/09/2014 18:02 Performed at [facility]    Culture   Final    NO GROWTH 5 DAYS Performed at [facility]    Report Status 01/15/2014 FINAL  Final  Culture, blood (routine x 2)     Status: None   Collection Time: 01/09/14  1:30 PM  Result Value Ref Range Status   Specimen Description BLOOD RIGHT IV START  Final   Special Requests BOTTLES DRAWN AEROBIC AND  ANAEROBIC 10CC  Final   Culture  Setup Time   Final    01/09/2014 18:02 Performed at [facility]    Culture   Final    NO GROWTH 5 DAYS Performed at [facility]    Report Status 01/15/2014 FINAL  Final  Clostridium Difficile by PCR     Status: None   Collection Time: 01/09/14  1:50 PM  Result Value Ref Range Status   C difficile by pcr NEGATIVE NEGATIVE Final    Radiology Reports [facility]  01/08/2014   CLINICAL DATA:  Acute renal failure  EXAM: RENAL/URINARY TRACT ULTRASOUND COMPLETE  COMPARISON:  None.  FINDINGS: Right Kidney:  Length: 15 cm. Increased renal cortical echogenicity. No mass or hydronephrosis visualized.  Left Kidney:  Length: 12.3 cm. Increased renal cortical echogenicity. No mass or hydronephrosis visualized.  Bladder:  Appears normal for degree of bladder distention.  IMPRESSION: 1. No obstructive uropathy. 2. Increased renal cortical echogenicity as can be seen with medical renal disease.   Electronically Signed   By: Elige KoHetal  Patel   On: 01/08/2014 21:15   Dg Chest Port 1 View  01/09/2014   CLINICAL DATA:  Hypoxia  EXAM: PORTABLE CHEST - 1 VIEW  COMPARISON:  01/08/2014  FINDINGS: Cardiomediastinal silhouette is stable. Mild elevation of the left hemidiaphragm. There is central mild vascular congestion and mild interstitial prominence suspicious for mild interstitial edema. Small left pleural effusion with left basilar atelectasis or infiltrate. Right IJ sheath is unchanged in position.  IMPRESSION: Mild vascular congestion mild interstitial prominence suspicious for mild interstitial edema. Small left pleural effusion with left basilar atelectasis or infiltrate.   Electronically Signed   By: Natasha MeadLiviu  Pop M.D.   On: 01/09/2014 10:45   Dg Chest Port 1 View  01/08/2014   CLINICAL DATA:  Evaluate central line placement.  EXAM: PORTABLE CHEST - 1 VIEW  COMPARISON:  01/08/2014  FINDINGS: There is a right IJ catheter with tip in the projection of the SVC. No  pneumothorax identified. Mild cardiac enlargement. There is calcified  plaque identified within the aortic arch. Pulmonary vascular congestion is noted. There is bibasilar atelectasis.  IMPRESSION: 1. Tip of the IJ catheter is in the SVC.  No pneumothorax. 2. Cardiac enlargement, atherosclerotic disease and pulmonary vascular congestion.   Electronically Signed   By: Signa Kellaylor  Stroud M.D.   On: 01/08/2014 23:52   Dg Abd Portable 1v  01/13/2014   CLINICAL DATA:  Generalized abdominal pain, nausea.  EXAM: PORTABLE ABDOMEN - 1 VIEW  COMPARISON:  January 08, 2014.  FINDINGS: The bowel gas pattern is normal. No radio-opaque calculi or other significant radiographic abnormality are seen.  IMPRESSION: No evidence of bowel obstruction or ileus.   Electronically Signed   By: Roque LiasJames  Green M.D.   On: 01/13/2014 18:18    CBC  Recent Labs Lab 01/10/14 0500 01/11/14 0535 01/12/14 0203 01/13/14 0500 01/14/14 0430  WBC 9.1 9.0 11.4* 11.8* 11.1*  HGB 10.1* 9.7* 10.3* 10.1* 9.9*  HCT 30.7* 30.9* 31.7* 31.9* 31.4*  PLT 223 233 236 281 269  MCV 91.1 90.4 90.6 92.2 90.8  MCH 30.0 28.4 29.4 29.2 28.6  MCHC 32.9 31.4 32.5 31.7 31.5  RDW 14.4 14.5 14.6 14.6 15.1  LYMPHSABS 1.6 1.3  --   --   --   MONOABS 1.2* 1.1*  --   --   --   EOSABS 1.1* 1.1*  --   --   --   BASOSABS 0.1 0.1  --   --   --     Chemistries   Recent Labs Lab 01/10/14 0500 01/11/14 0535 01/12/14 0203 01/13/14 0500 01/14/14 0430 01/15/14 0535 01/16/14 0845  NA 141 141 140 142 144 143 147  K 4.2 4.8 5.3 5.4* 5.4* 5.1 4.9  CL 104 104 104 102 106 105 112  CO2 21 19 16* 18* 18* 24 24  GLUCOSE 88 82 77 94 102* 91 94  BUN 49* 50* 53* 58* 59* 41* 46*  CREATININE 8.99* 9.89* 10.46* 12.00* 11.88* 7.83* 8.18*  CALCIUM 7.3* 7.0* 7.0* 7.7* 8.4 8.5 8.7  MG  --  1.8  --   --   --   --   --   AST 6 8  --  6  --   --   --   ALT 6 6  --  6  --   --   --   ALKPHOS 67 66  --  66  --   --   --   BILITOT <0.2* <0.2*  --  <0.2*  --   --   --     ------------------------------------------------------------------------------------------------------------------ estimated creatinine clearance is 7 mL/min (by C-G formula based on Cr of 8.18). ------------------------------------------------------------------------------------------------------------------ No results for input(s): HGBA1C in the last 72 hours. ------------------------------------------------------------------------------------------------------------------ No results for input(s): CHOL, HDL, LDLCALC, TRIG, CHOLHDL, LDLDIRECT in the last 72 hours. ------------------------------------------------------------------------------------------------------------------ No results for input(s): TSH, T4TOTAL, T3FREE, THYROIDAB in the last 72 hours.  Invalid input(s): FREET3 ------------------------------------------------------------------------------------------------------------------ No results for input(s): VITAMINB12, FOLATE, FERRITIN, TIBC, IRON, RETICCTPCT in the last 72 hours.  Coagulation profile No results for input(s): INR, PROTIME in the last 168 hours.  No results for input(s): DDIMER in the last 72 hours.  Cardiac Enzymes No results for input(s): CKMB, TROPONINI, MYOGLOBIN in the last 168 hours.  Invalid input(s): CK ------------------------------------------------------------------------------------------------------------------ Invalid input(s): POCBNP    [name] D.O. on 01/16/2014 at 12:06 PM  Between 7am to 7pm - Pager - [phone]  After 7pm go to [email or web address] - password [email or web address]  And look for the night  coverage person covering for me after hours  [facility] Office  [phone]  

## 2014-01-16 NOTE — Evaluation (Signed)
Physical Therapy Evaluation Patient Details Name: [name] MRN: [id number] DOB: [birth year] Today's Date: 01/16/2014   History of Present Illness  Pt is a 79 year old female w/ a hx of COPD and HTN who was [facility]. She presented to that ED with complaints of diarrhea for at least 3 days. Apparently she had reported abdominal pain associated with this diarrhea and decreasing urinary output withSOB. No apparent history of CKD. Because of her acuity and possible need for acute dialysis she was [facility] for further management. Her initial chest x-ray demonstrated pulmonary edema. After arrival she was evaluated by Nephrology who determined urgent HD was indicated. Temporary dialysis catheter was inserted by PCCM and she underwent urgent hemodialysis. Postdialysis patient had issues with hypotension that required fluid challenges.  Clinical Impression  Pt admitted with the above. Pt currently with functional limitations due to the deficits listed below (see PT Problem List). At the time of PT eval pt was able to ambulate 10-20' at a time. Pt fatigues quickly and requires lengthy seated rest breaks to recover. Tolerance for functional activity was low PTA per daughter, however she attempted to push pt to stay active and walk for exercise.   Pt will benefit from skilled PT to increase their independence and safety with mobility to allow discharge to the venue listed below. Discussed recommendation for SNF at d/c and pt's daughter states she would like to discuss with her brothers and CSW. Daughter lives in [place] and states that PTA she and her brother were discussing possibility of pt staying in [place] for 6 months and then moving in with daughter for 6 months.   See gait details for O2 status during gait training.      Follow Up Recommendations SNF;Supervision/Assistance - 24 hour    Equipment Recommendations  Rolling walker with 5" wheels     Recommendations for Other Services       Precautions / Restrictions Precautions Precautions: Fall Restrictions Weight Bearing Restrictions: No      Mobility  Bed Mobility               General bed mobility comments: Pt sitting on EOB when PT arrived. Pt's daughter states that she assisted pt to the EOB.   Transfers Overall transfer level: Needs assistance Equipment used: Rolling walker (2 wheeled) Transfers: Sit to/from Stand Sit to Stand: Min assist         General transfer comment: VC's for hand placement on seated surface for safety. Pt required assist to power-up to full standing position.   Ambulation/Gait Ambulation/Gait assistance: Min assist Ambulation Distance (Feet): 30 Feet (10', seated rest break, then 20') Assistive device: Rolling walker (2 wheeled) Gait Pattern/deviations: Step-through pattern;Decreased stride length;Trunk flexed;Narrow base of support;Drifts right/left Gait velocity: Decreased Gait velocity interpretation: Below normal speed for age/gender General Gait Details: Pt fatigues quickly and daughter followed with the recliner for seated rest breaks. After ambulating 10' HR increased to 140's and O2 sats decreased to 85% on 2L/min. Supplemental O2 was increased to 3 and then again to 4L/min until O2 improved to 92-94%. After ~5 minute rest break pt was able to ambulate 20' with O2 sats remaining in the low 90's.   Stairs            Wheelchair Mobility    Modified Rankin (Stroke Patients Only)       Balance Overall balance assessment: Needs assistance Sitting-balance support: Feet supported;No upper extremity supported Sitting balance-Leahy Scale: Fair Sitting balance -  Comments: Due to scoliosis(?) pt with L shoulder elevation and R trunk lean.   Standing balance support: Bilateral upper extremity supported;During functional activity Standing balance-Leahy Scale: Poor                               Pertinent  Vitals/Pain Pain Assessment: No/denies pain    Home Living Family/patient expects to be discharged to:: Private residence Living Arrangements: Children Available Help at Discharge: Family;Available 24 hours/day Type of Home: House Home Access: Stairs to enter   [employer]trance Stairs-Number of Steps: 2 Home Layout: One level Home Equipment: Walker - 4 wheels;Wheelchair - manual Additional Comments: Pt has one step down into her bedroom and family has built a ramp for step down into bathroom from main level    Prior Function Level of Independence: Needs assistance   Gait / Transfers Assistance Needed: Uses 4WW all the time and utilizes the seat component often            Hand Dominance   Dominant Hand: Right    Extremity/Trunk Assessment   Upper Extremity Assessment: Defer to OT evaluation           Lower Extremity Assessment: Generalized weakness      Cervical / Trunk Assessment: Kyphotic;Other exceptions  Communication   Communication: HOH  Cognition Arousal/Alertness: Lethargic Behavior During Therapy: Flat affect Overall Cognitive Status: Within Functional Limits for tasks assessed                      General Comments      Exercises        Assessment/Plan    PT Assessment Patient needs continued PT services  PT Diagnosis Difficulty walking;Generalized weakness   PT Problem List Decreased strength;Decreased range of motion;Decreased activity tolerance;Decreased balance;Decreased mobility;Decreased knowledge of use of DME;Decreased safety awareness;Decreased knowledge of precautions;Cardiopulmonary status limiting activity  PT Treatment Interventions DME instruction;Gait training;Stair training;Functional mobility training;Therapeutic activities;Therapeutic exercise;Neuromuscular re-education;Patient/family education   PT Goals (Current goals can be found in the Care Plan section) Acute Rehab PT Goals Patient Stated Goal: Pt did not state goals. Pt's  daughter's goal is return of function. PT Goal Formulation: With patient/family Time For Goal Achievement: 01/30/14 Potential to Achieve Goals: Fair    Frequency Min 2X/week   Barriers to discharge Other (comment) Pt's daughter lives in [place] and she seems to be struggling with discharge planning. Pt's sons are in the [place] area and one lives with the pt. Daughter reports that PTA she and her brother were planning on 6 months in [place].     Co-evaluation               End of Session Equipment Utilized During Treatment: Gait belt;Oxygen Activity Tolerance: Patient limited by fatigue Patient left: in chair;with call bell/phone within reach;with family/visitor present Nurse Communication: Mobility status;Other (comment) (Pt dtr asking for RD consult and breathing treatment)         Time: [id number] PT Time Calculation (min) (ACUTE ONLY): 39 min   Charges:   PT Evaluation $Initial PT Evaluation Tier I: 1 Procedure PT Treatments $Gait Training: 23-37 mins   PT G Codes:          Conni SlipperKirkman, Tritia Endo 01/16/2014, 10:24 AM  Conni SlipperLaura Creek Gan, PT, DPT Acute Rehabilitation Services Pager: [phone]

## 2014-01-16 NOTE — Plan of Care (Signed)
Problem: Phase I Progression Outcomes Goal: Dyspnea controlled at rest Outcome: Completed/Met Date Met:  01/16/14 Goal: Pain controlled with appropriate interventions Outcome: Completed/Met Date Met:  01/16/14     

## 2014-01-16 NOTE — Progress Notes (Signed)
INITIAL NUTRITION ASSESSMENT  DOCUMENTATION CODES Per approved criteria  -Obesity Unspecified   INTERVENTION: Continue Nepro Shake po BID, each supplement provides 425 kcal and 19 grams protein.  Encourage PO intake.  NUTRITION DIAGNOSIS: Inadequate oral intake related to decreased appetite as evidenced by meal completion of 25-50%.   Goal: Pt to meet >/= 90% of their estimated nutrition needs   Monitor:  PO intake, weight trends, labs, I/O's  Reason for Assessment: Poor PO  79 y.o. female  Admitting Dx: Acute renal failure  ASSESSMENT: Pt presents as a transfer from [facility]. Patient apparently has a past history of COPD presented to the ED with complaints of diarrhea. At [facility] she was noted to have hyperkalemia with a potassium of 9. Nephrology was consulted and determined patient needed urgent hemodialysis. Temporary dialysis catheter was inserted by PCCM and patient underwent hemodialysis. Potassium corrected.  Meal completion is 25-50%. Pt was asleep during time of visit, however daughter was present. She reports pt has been having a decreased appetite, which had started a couple of days before admission which is when she started to become ill. PTA her illness and decreased appetite she reports pt was eating well with no difficulties. She reports weight has been stable. Pt has been ordered Nepro, daughter reports pt has been drinking them. Will continue with current orders. Daughter also requested a renal diet education. Education given.   Unable to perform nutrition focused physical exam during this time was pt would not wake.  Labs: Low GFR. High BUN, creatinine, and phosphorous (7).  Height: Ht Readings from Last 1 Encounters:  01/15/14 5\' 8"  (1.727 m)    Weight: Wt Readings from Last 1 Encounters:  01/15/14 210 lb 15.7 oz (95.7 kg)    Ideal Body Weight: 140 lbs  % Ideal Body Weight: 150%  Wt Readings from Last 10 Encounters:  01/15/14 210 lb 15.7 oz  (95.7 kg)    Usual Body Weight: 210 lbs  % Usual Body Weight: 100%  BMI:  Body mass index is 32.09 kg/(m^2). Class I obesity  Estimated Nutritional Needs: Kcal: 1850-2100 Protein: 95-115 grams Fluid: 1.2 L/day  Skin: +1 generalized edema  Diet Order: Diet renal W/12600mL fluid restriction  EDUCATION NEEDS: -Education needs addressed   Intake/Output Summary (Last 24 hours) at 01/16/14 1522 Last data filed at 01/16/14 0500  Gross per 24 hour  Intake    120 ml  Output   1600 ml  Net  -1480 ml    Last BM: 11/28  Labs:   Recent Labs Lab 01/11/14 0535  01/14/14 0430 01/15/14 0535 01/16/14 0845  NA 141  < > 144 143 147  K 4.8  < > 5.4* 5.1 4.9  CL 104  < > 106 105 112  CO2 19  < > 18* 24 24  BUN 50*  < > 59* 41* 46*  CREATININE 9.89*  < > 11.88* 7.83* 8.18*  CALCIUM 7.0*  < > 8.4 8.5 8.7  MG 1.8  --   --   --   --   PHOS  --   < > 9.3* 6.9* 7.0*  GLUCOSE 82  < > 102* 91 94  < > = values in this interval not displayed.  CBG (last 3)   Recent Labs  01/14/14 0817 01/15/14 0758  GLUCAP 122* 100*    Scheduled Meds: . antiseptic oral rinse  7 mL Mouth Rinse BID  . aspirin EC  81 mg Oral Daily  . feeding supplement (NEPRO CARB  STEADY)  237 mL Oral BID BM  . folic acid  1 mg Oral Daily  . heparin  5,000 Units Subcutaneous 3 times per day  . latanoprost  1 drop Both Eyes QHS  . levothyroxine  25 mcg Oral QAC breakfast  . multivitamin with minerals  1 tablet Oral Daily  . pantoprazole  40 mg Oral BID  . pilocarpine  1 drop Right Eye QID  . senna-docusate  1 tablet Oral BID  . thiamine  100 mg Oral Daily    Continuous Infusions:   Past Medical History  Diagnosis Date  . Hypertension   . Hypothyroidism   . Seasonal allergies   . Glaucoma   . COPD (chronic obstructive pulmonary disease)   . Former smoker     History reviewed. No pertinent past surgical history.  Marijean NiemannStephanie La, MS, RD, LDN Pager # [phone] After hours/ weekend pager # [phone]

## 2014-01-16 NOTE — Progress Notes (Signed)
S: Sitting up in chair.  Eating some O:BP 141/82 mmHg  Pulse 123  Temp(Src) 98.5 F (36.9 C) (Oral)  Resp 19  Ht 5\' 8"  (1.727 m)  Wt 95.7 kg (210 lb 15.7 oz)  BMI 32.09 kg/m2  SpO2 97%  Intake/Output Summary (Last 24 hours) at 01/16/14 1003 Last data filed at 01/16/14 0500  Gross per 24 hour  Intake    120 ml  Output   1750 ml  Net  -1630 ml   Weight change: 0 kg (0 lb) Gen: Awake and alert CVS: Tachy, reg Resp: Decreased BS throughout Abd: + BS NTND Ext: trace edema NEURO: CNI.  OX3 Rt IJ temp HD cath  . antiseptic oral rinse  7 mL Mouth Rinse BID  . aspirin EC  81 mg Oral Daily  . folic acid  1 mg Oral Daily  . heparin  5,000 Units Subcutaneous 3 times per day  . latanoprost  1 drop Both Eyes QHS  . levothyroxine  25 mcg Oral QAC breakfast  . multivitamin with minerals  1 tablet Oral Daily  . pantoprazole  40 mg Oral BID  . pilocarpine  1 drop Right Eye QID  . senna-docusate  1 tablet Oral BID  . thiamine  100 mg Oral Daily   No results found. BMET    Component Value Date/Time   NA 147 01/16/2014 0845   K 4.9 01/16/2014 0845   CL 112 01/16/2014 0845   CO2 24 01/16/2014 0845   GLUCOSE 94 01/16/2014 0845   BUN 46* 01/16/2014 0845   CREATININE 8.18* 01/16/2014 0845   CALCIUM 8.7 01/16/2014 0845   GFRNONAA 4* 01/16/2014 0845   GFRAA 5* 01/16/2014 0845   CBC    Component Value Date/Time   WBC 11.1* 01/14/2014 0430   RBC 3.46* 01/14/2014 0430   HGB 9.9* 01/14/2014 0430   HCT 31.4* 01/14/2014 0430   PLT 269 01/14/2014 0430   MCV 90.8 01/14/2014 0430   MCH 28.6 01/14/2014 0430   MCHC 31.5 01/14/2014 0430   RDW 15.1 01/14/2014 0430   LYMPHSABS 1.3 01/11/2014 0535   MONOABS 1.1* 01/11/2014 0535   EOSABS 1.1* 01/11/2014 0535   BASOSABS 0.1 01/11/2014 0535     Assessment: 1.  Presumably ARF with good UO. ? ATN  Renal fx nl with Scr 0.9 last May.  Scr sl higher today 2.  COPD 3. Hx HTN  Plan: 1.  Re assess Scr in AM, if sig higher then will HD  again 2. Nepro BID   Eather Chaires T

## 2014-01-16 NOTE — Plan of Care (Addendum)
Problem: Food- and Nutrition-Related Knowledge Deficit (NB-1.1) Goal: Nutrition education Formal process to instruct or train a patient/client in a skill or to impart knowledge to help patients/clients voluntarily manage or modify food choices and eating behavior to maintain or improve health. Outcome: Completed/Met Date Met:  01/16/14 Nutrition Education Note  RD consulted for Renal Education. Provided a handout educating of healthy eating related to ARF to patient/family. Reviewed food groups and provided written recommended serving sizes specifically determined for patient's current nutritional status.   Noted pt with elevated phosphorous labs. Explained why current diet restrictions are needed and provided lists of foods to limit/avoid that are high potassium, sodium, and especially phosphorus. Provided specific recommendations on safer alternatives of these foods. Strongly encouraged compliance of this diet.   Discussed importance of protein intake at each meal and snack. Provided examples of how to maximize protein intake throughout the day. Discussed need for fluid restriction with dialysis, and renal-friendly beverage options. Teach back method used.  Expect good compliance.  Kallie Locks, MS, RD, LDN Pager # [phone] After hours/ weekend pager # [phone]

## 2014-01-17 LAB — CBC
HEMATOCRIT: 29.2 % — AB (ref 36.0–46.0)
Hemoglobin: 8.9 g/dL — ABNORMAL LOW (ref 12.0–15.0)
MCH: 28.3 pg (ref 26.0–34.0)
MCHC: 30.5 g/dL (ref 30.0–36.0)
MCV: 93 fL (ref 78.0–100.0)
Platelets: 214 10*3/uL (ref 150–400)
RBC: 3.14 MIL/uL — AB (ref 3.87–5.11)
RDW: 14.7 % (ref 11.5–15.5)
WBC: 8.1 10*3/uL (ref 4.0–10.5)

## 2014-01-17 LAB — RENAL FUNCTION PANEL
ANION GAP: 16 — AB (ref 5–15)
Albumin: 2.1 g/dL — ABNORMAL LOW (ref 3.5–5.2)
BUN: 49 mg/dL — ABNORMAL HIGH (ref 6–23)
CO2: 21 meq/L (ref 19–32)
Calcium: 8.9 mg/dL (ref 8.4–10.5)
Chloride: 112 mEq/L (ref 96–112)
Creatinine, Ser: 8.09 mg/dL — ABNORMAL HIGH (ref 0.50–1.10)
GFR calc non Af Amer: 4 mL/min — ABNORMAL LOW (ref >90)
GFR, EST AFRICAN AMERICAN: 5 mL/min — AB (ref >90)
Glucose, Bld: 102 mg/dL — ABNORMAL HIGH (ref 70–99)
POTASSIUM: 4.7 meq/L (ref 3.7–5.3)
Phosphorus: 7.6 mg/dL — ABNORMAL HIGH (ref 2.3–4.6)
SODIUM: 149 meq/L — AB (ref 137–147)

## 2014-01-17 LAB — GLUCOSE, CAPILLARY: GLUCOSE-CAPILLARY: 92 mg/dL (ref 70–99)

## 2014-01-17 NOTE — Progress Notes (Signed)
Occupational Therapy Evaluation Patient Details Name: [name] MRN: [id number] DOB: 03/10/1934 Today's Date: 01/17/2014    History of Present Illness Pt is a 79 year old female w/ a hx of COPD and HTN who was [facility]. She presented to that ED with complaints of diarrhea for at least 3 days. Apparently she had reported abdominal pain associated with this diarrhea and decreasing urinary output withSOB. No apparent history of CKD. Because of her acuity and possible need for acute dialysis she was [facility]. Her initial chest x-ray demonstrated pulmonary edema. After arrival she was evaluated by Nephrology who determined urgent HD was indicated. Temporary dialysis catheter was inserted by PCCM and she underwent urgent hemodialysis. Postdialysis patient had issues with hypotension that required fluid challenges.   Clinical Impression   Patient presents to OT with decreased ADL independence and safety due to the functional deficits listed below. Will benefit from skilled OT to maximize function and to facilitate a safe discharge plan.    Follow Up Recommendations  SNF;Supervision/Assistance - 24 hour    Equipment Recommendations  Other (comment) (tbd at next venue of care)    Recommendations for Other Services       Precautions / Restrictions Precautions Precautions: Fall Restrictions Weight Bearing Restrictions: No      Mobility Bed Mobility                  Transfers                      Balance                                            ADL Overall ADL's : Needs assistance/impaired Eating/Feeding: Set up;Sitting (rest breaks needed due to SOB/decreased activity tolerance)   Grooming: Wash/dry hands;Wash/dry face;Set up;Supervision/safety   Upper Body Bathing: Maximal assistance   Lower Body Bathing: Total assistance   Upper Body Dressing : Maximal assistance   Lower  Body Dressing: Total assistance                 General ADL Comments: Max encouragement to get pt to try ADLs on her own. She was somewhat lethargic and sleepy during evaluation. Gets fatigued with eating lunch and needs frequent rest breaks.     Vision                     Perception     Praxis      Pertinent Vitals/Pain Pain Assessment: No/denies pain     Hand Dominance Right   Extremity/Trunk Assessment Upper Extremity Assessment Upper Extremity Assessment: Generalized weakness   Lower Extremity Assessment Lower Extremity Assessment: Defer to PT evaluation       Communication Communication Communication: HOH   Cognition Arousal/Alertness: Lethargic Behavior During Therapy: Flat affect Overall Cognitive Status: Within Functional Limits for tasks assessed                     General Comments       Exercises       Shoulder Instructions      Home Living Family/patient expects to be discharged to:: Private residence Living Arrangements: Children Available Help at Discharge: Family;Available 24 hours/day Type of Home: House Home Access: Stairs to enter Enter[employer] CorporationEntrance Stairs-Number of Steps: 2   Home Layout: One level  Bathroom Shower/Tub: Chief Strategy OfficerTub/shower unit   Bathroom Toilet: Handicapped height     Home Equipment: Environmental consultantWalker - 4 wheels;Wheelchair - manual;Shower seat   Additional Comments: Pt has one step down into her bedroom and family has built a ramp for step down into bathroom from main level. Two sons live with her. One son works, and he also assists with washing pt's hair and doing housework and Pharmacologistlaundry. The other son is with her 24/7. There is another son who lives next door. Pt's dtr [place] was present [place].      Prior Functioning/Environment Level of Independence: Needs assistance  Gait / Transfers Assistance Needed: Uses 4WW all the time and utilizes the seat component often  ADL's / Homemaking Assistance Needed: Does  sponge bathing and getting dressed independently. Son assists with washing pt's hair. Niece occasionally helps pt with a shower. Pt uses O2 at home with activity.         OT Diagnosis: Generalized weakness   OT Problem List: Decreased strength;Decreased activity tolerance;Impaired balance (sitting and/or standing);Impaired vision/perception;Decreased knowledge of use of DME or AE;Decreased safety awareness;Cardiopulmonary status limiting activity   OT Treatment/Interventions: Self-care/ADL training;Therapeutic exercise;Energy conservation;DME and/or AE instruction;Therapeutic activities;Patient/family education    OT Goals(Current goals can be found in the care plan section) Acute Rehab OT Goals Patient Stated Goal: Pt did not state goals. Pt's daughter's goal is return of function. OT Goal Formulation: With patient Time For Goal Achievement: 01/31/14 Potential to Achieve Goals: Good  OT Frequency: Min 2X/week   Barriers to D/C:            Co-evaluation              End of Session Equipment Utilized During Treatment: Oxygen  Activity Tolerance: Patient limited by lethargy Patient left: in chair;with call bell/phone within reach;with family/visitor present   Time: 1240-1311 OT Time Calculation (min): 31 min Charges:  OT General Charges $OT Visit: 1 Procedure OT Evaluation $Initial OT Evaluation Tier I: 1 Procedure OT Treatments $Self Care/Home Management : 23-37 mins G-Codes:    [name] A 01/17/2014, 3:10 PM

## 2014-01-17 NOTE — Progress Notes (Signed)
[facility]                                                                              Patient Demographics  [name], is a 79 y.o. female, DOB - [birth year], WUJ:[id number]  Admit date - 01/08/2014   Admitting Physician Marinda Elk, MD  Outpatient Primary MD for the patient is No primary care provider on file.  LOS - 9   No chief complaint on file.     HPI on 01/08/2014 by Dr. Freda Munro  [name] is a 79 y.o. female presents as a transfer from [place]. Not a reliable historian and no family is present. Patient apparently has a past history of COPD presented to the ED with complaints of diarrhea. She states that this has been going on for a while. She states that she has had some abdominal pain associated. She also notes decreased urine output. She admits to having shortness of breath, She has no prior history of kidney disease. At [place] she was noted to have hyperkalemia with a potassium of 9 this was aggressively treated and came down minimally to a final reading of 8 prior to transfer. Patient also was noted to have a creatinine of 14 and therefore she was transferred to [facility] for further management.  Interim history Nephrology was consulted and determined patient needed urgent hemodialysis. Temporary dialysis catheter was inserted by PCCM and patient underwent hemodialysis. Her potassium was corrected with dialysis, her creatinine also decreased to 6. Postdialysis, patient had issues with hypotension and required fluid challenges. On 11:30 patient's creatinine began to increase again, patient did have adequate urine output and continues to do so. She continued to have symptoms of encephalopathy which is likely secondary to her uremia. Nephrology is hopeful the patient's renal function will recover and patient will not R lifelong dialysis  Assessment & Plan   Acute renal failure/questionable ATN/hyperkalemia -Unknown etiology, possibly dehydration, diarrhea,  diuretic use -Baseline creatinine per daughter in May 2015 0.9 with a BUN of 17 -Hyperkalemia has resolved will continue to monitor BMP -Renal ultrasound showed mild medical renal disease -Creatinine today 8.09 -Nephrology consulted and appreciated, continue to trend and follow Cr -Patient has had good UO over the past 24hrs: 1850  Acute respiratory failure with hypoxia/pulmonary edema -Improved -Currently on 2 L oxygen -Per daughter at bedside, patient does require oxygen at home with ambulation -Will continue to monitor  Left upper extremity SVT -No DVT noted on Doppler -Continue conservative management, keep arm elevated  Hypotension -Resolved, no evidence of sepsis or infection -Echocardiogram showed no significant pericardial effusion  Acute encephalopathy -Resolved, per daughter patient is at her baseline. -Likely secondary to acute renal failure with uremia  Diarrhea/constipation -Patient had diarrhea before admission however now appears to be constipated -C. difficile PCR negative -KUB on 01/13/2014 showed no evidence of bowel obstruction or ileus -Continue bowel regimen  COPD/cough -Appears stable  Hypertension -Patient was on Tenormin and Lasix at home, which have been held during her hospitalization  Hypothyroidism -TSH 1.8 -Continue Synthroid  GERD -Continue Protonix  Deconditioning -PT consulted and recommended nursing home placement  Code Status: Full  Family Communication: Daughter at bedside  Disposition Plan:  Admitted  Time Spent in minutes   30 minutes  Procedures  Insertion of hemodialysis catheter 01/08/2014 Echocardiogram on 01/09/2014: EF 55%, grade 1 diastolic dysfunction  Consults   PCCM Nephrology  DVT Prophylaxis  Heparin  Lab Results  Component Value Date   PLT 214 01/17/2014    Medications  Scheduled Meds: . antiseptic oral rinse  7 mL Mouth Rinse BID  . aspirin EC  81 mg Oral Daily  . feeding supplement (NEPRO  CARB STEADY)  237 mL Oral BID BM  . folic acid  1 mg Oral Daily  . heparin  5,000 Units Subcutaneous 3 times per day  . latanoprost  1 drop Both Eyes QHS  . levothyroxine  25 mcg Oral QAC breakfast  . multivitamin with minerals  1 tablet Oral Daily  . pantoprazole  40 mg Oral BID  . pilocarpine  1 drop Right Eye QID  . senna-docusate  1 tablet Oral BID  . thiamine  100 mg Oral Daily   Continuous Infusions:  PRN Meds:.sodium chloride, sodium chloride, acetaminophen **OR** acetaminophen, albuterol, feeding supplement (NEPRO CARB STEADY), heparin, lidocaine (PF), lidocaine-prilocaine, ondansetron **OR** ondansetron (ZOFRAN) IV, pentafluoroprop-tetrafluoroeth, sodium chloride  Antibiotics    Anti-infectives    Start     Dose/Rate Route Frequency Ordered Stop   01/08/14 1945  cefTRIAXone (ROCEPHIN) 1 g in dextrose 5 % 50 mL IVPB - Premix  Status:  Discontinued     1 g100 mL/hr over 30 Minutes Intravenous Every 24 hours 01/08/14 1933 01/11/14 1854        Subjective:   [name] seen and examined today.  Patient states she's feeling better.  She does not like the idea of going to nursing home or rehab but understand the need.  She denies chest pain, SOB, cough, abdominal pain.  Objective:   Filed Vitals:   01/16/14 1031 01/16/14 1808 01/16/14 2036 01/17/14 0449  BP:  147/69 127/65 116/56  Pulse:  113 107 107  Temp:  98.9 F (37.2 C) 99.1 F (37.3 C) 99 F (37.2 C)  TempSrc:  Oral    Resp:  18 20 17   Height:      Weight:   93.441 kg (206 lb)   SpO2: 98% 100% 100% 98%    Wt Readings from Last 3 Encounters:  01/16/14 93.441 kg (206 lb)     Intake/Output Summary (Last 24 hours) at 01/17/14 1021 Last data filed at 01/17/14 1000  Gross per 24 hour  Intake    370 ml  Output   1550 ml  Net  -1180 ml    Exam  General: Well developed, well nourished, NAD, appears stated age  HEENT: NCAT,  mucous membranes moist.   Neck: Supple, right IJ HD cath  Cardiovascular:  S1 S2 auscultated, no rubs, murmurs or gallops. Regular rate and rhythm.  Respiratory: Diminished but clear breath sounds  Abdomen: Soft, nontender, nondistended, + bowel sounds  Extremities: warm dry without cyanosis clubbing. Trace LE edema B/L  Neuro: AAOx3, no focal deficits  Psych: Appropriate  Data Review   Micro Results Recent Results (from the past 240 hour(s))  MRSA PCR Screening     Status: None   Collection Time: 01/08/14  8:38 PM  Result Value Ref Range Status   MRSA by PCR NEGATIVE NEGATIVE Final    Comment:        The GeneXpert MRSA Assay (FDA approved for NASAL specimens only), is one component of a comprehensive MRSA colonization surveillance program. It is  not intended to diagnose MRSA infection nor to guide or monitor treatment for MRSA infections.   Urine culture     Status: None   Collection Time: 01/08/14 10:28 PM  Result Value Ref Range Status   Specimen Description URINE, CATHETERIZED  Final   Special Requests NONE  Final   Culture  Setup Time   Final    01/08/2014 23:11 Performed at [facility]    Colony Count NO GROWTH Performed at [facility]   Final   Culture NO GROWTH Performed at [facility]   Final   Report Status 01/10/2014 FINAL  Final  Culture, blood (routine x 2)     Status: None   Collection Time: 01/09/14  1:25 PM  Result Value Ref Range Status   Specimen Description BLOOD LEFT ANTECUBITAL  Final   Special Requests BOTTLES DRAWN AEROBIC AND ANAEROBIC 10CC  Final   Culture  Setup Time   Final    01/09/2014 18:02 Performed at [facility]    Culture   Final    NO GROWTH 5 DAYS Performed at [facility]    Report Status 01/15/2014 FINAL  Final  Culture, blood (routine x 2)     Status: None   Collection Time: 01/09/14  1:30 PM  Result Value Ref Range Status   Specimen Description BLOOD RIGHT IV START  Final   Special Requests BOTTLES DRAWN AEROBIC AND ANAEROBIC 10CC  Final    Culture  Setup Time   Final    01/09/2014 18:02 Performed at [facility]    Culture   Final    NO GROWTH 5 DAYS Performed at [facility]    Report Status 01/15/2014 FINAL  Final  Clostridium Difficile by PCR     Status: None   Collection Time: 01/09/14  1:50 PM  Result Value Ref Range Status   C difficile by pcr NEGATIVE NEGATIVE Final    Radiology Reports [facility]  01/08/2014   CLINICAL DATA:  Acute renal failure  EXAM: RENAL/URINARY TRACT ULTRASOUND COMPLETE  COMPARISON:  None.  FINDINGS: Right Kidney:  Length: 15 cm. Increased renal cortical echogenicity. No mass or hydronephrosis visualized.  Left Kidney:  Length: 12.3 cm. Increased renal cortical echogenicity. No mass or hydronephrosis visualized.  Bladder:  Appears normal for degree of bladder distention.  IMPRESSION: 1. No obstructive uropathy. 2. Increased renal cortical echogenicity as can be seen with medical renal disease.   Electronically Signed   By: Elige KoHetal  Patel   On: 01/08/2014 21:15   Dg Chest Port 1 View  01/09/2014   CLINICAL DATA:  Hypoxia  EXAM: PORTABLE CHEST - 1 VIEW  COMPARISON:  01/08/2014  FINDINGS: Cardiomediastinal silhouette is stable. Mild elevation of the left hemidiaphragm. There is central mild vascular congestion and mild interstitial prominence suspicious for mild interstitial edema. Small left pleural effusion with left basilar atelectasis or infiltrate. Right IJ sheath is unchanged in position.  IMPRESSION: Mild vascular congestion mild interstitial prominence suspicious for mild interstitial edema. Small left pleural effusion with left basilar atelectasis or infiltrate.   Electronically Signed   By: Natasha MeadLiviu  Pop M.D.   On: 01/09/2014 10:45   Dg Chest Port 1 View  01/08/2014   CLINICAL DATA:  Evaluate central line placement.  EXAM: PORTABLE CHEST - 1 VIEW  COMPARISON:  01/08/2014  FINDINGS: There is a right IJ catheter with tip in the projection of the SVC. No pneumothorax identified. Mild  cardiac enlargement. There is calcified  plaque identified within the aortic arch. Pulmonary vascular congestion is noted. There is bibasilar atelectasis.  IMPRESSION: 1. Tip of the IJ catheter is in the SVC.  No pneumothorax. 2. Cardiac enlargement, atherosclerotic disease and pulmonary vascular congestion.   Electronically Signed   By: Signa Kell M.D.   On: 01/08/2014 23:52   Dg Abd Portable 1v  01/13/2014   CLINICAL DATA:  Generalized abdominal pain, nausea.  EXAM: PORTABLE ABDOMEN - 1 VIEW  COMPARISON:  January 08, 2014.  FINDINGS: The bowel gas pattern is normal. No radio-opaque calculi or other significant radiographic abnormality are seen.  IMPRESSION: No evidence of bowel obstruction or ileus.   Electronically Signed   By: Roque Lias M.D.   On: 01/13/2014 18:18    CBC  Recent Labs Lab 01/11/14 0535 01/12/14 0203 01/13/14 0500 01/14/14 0430 01/17/14 0530  WBC 9.0 11.4* 11.8* 11.1* 8.1  HGB 9.7* 10.3* 10.1* 9.9* 8.9*  HCT 30.9* 31.7* 31.9* 31.4* 29.2*  PLT 233 236 281 269 214  MCV 90.4 90.6 92.2 90.8 93.0  MCH 28.4 29.4 29.2 28.6 28.3  MCHC 31.4 32.5 31.7 31.5 30.5  RDW 14.5 14.6 14.6 15.1 14.7  LYMPHSABS 1.3  --   --   --   --   MONOABS 1.1*  --   --   --   --   EOSABS 1.1*  --   --   --   --   BASOSABS 0.1  --   --   --   --     Chemistries   Recent Labs Lab 01/11/14 0535  01/13/14 0500 01/14/14 0430 01/15/14 0535 01/16/14 0845 01/17/14 0530  NA 141  < > 142 144 143 147 149*  K 4.8  < > 5.4* 5.4* 5.1 4.9 4.7  CL 104  < > 102 106 105 112 112  CO2 19  < > 18* 18* 24 24 21   GLUCOSE 82  < > 94 102* 91 94 102*  BUN 50*  < > 58* 59* 41* 46* 49*  CREATININE 9.89*  < > 12.00* 11.88* 7.83* 8.18* 8.09*  CALCIUM 7.0*  < > 7.7* 8.4 8.5 8.7 8.9  MG 1.8  --   --   --   --   --   --   AST 8  --  6  --   --   --   --   ALT 6  --  6  --   --   --   --   ALKPHOS 66  --  66  --   --   --   --   BILITOT <0.2*  --  <0.2*  --   --   --   --   < > = values in this interval  not displayed. ------------------------------------------------------------------------------------------------------------------ estimated creatinine clearance is 7 mL/min (by C-G formula based on Cr of 8.09). ------------------------------------------------------------------------------------------------------------------ No results for input(s): HGBA1C in the last 72 hours. ------------------------------------------------------------------------------------------------------------------ No results for input(s): CHOL, HDL, LDLCALC, TRIG, CHOLHDL, LDLDIRECT in the last 72 hours. ------------------------------------------------------------------------------------------------------------------ No results for input(s): TSH, T4TOTAL, T3FREE, THYROIDAB in the last 72 hours.  Invalid input(s): FREET3 ------------------------------------------------------------------------------------------------------------------ No results for input(s): VITAMINB12, FOLATE, FERRITIN, TIBC, IRON, RETICCTPCT in the last 72 hours.  Coagulation profile No results for input(s): INR, PROTIME in the last 168 hours.  No results for input(s): DDIMER in the last 72 hours.  Cardiac Enzymes No results for input(s): CKMB, TROPONINI, MYOGLOBIN in the last 168 hours.  Invalid input(s): CK ------------------------------------------------------------------------------------------------------------------  Invalid input(s): POCBNP    [name] D.O. on 01/17/2014 at 10:21 AM  Between 7am to 7pm - Pager - [phone]  After 7pm go to [email or web address] - password [email or web address]  And look for the night coverage person covering for me after hours  [facility] Office  [phone]

## 2014-01-17 NOTE — Plan of Care (Signed)
Problem: Phase I Progression Outcomes Goal: Education officer, museum consult (if SNF or HD pt.) Outcome: Completed/Met Date Met:  01/17/14 Goal: OOB as tolerated unless otherwise ordered Outcome: Completed/Met Date Met:  01/17/14 Goal: Initial discharge plan identified Outcome: Completed/Met Date Met:  01/17/14

## 2014-01-17 NOTE — Progress Notes (Signed)
S: Ate a little breakfast.  NO N/V O:BP 116/56 mmHg  Pulse 107  Temp(Src) 99 F (37.2 C) (Oral)  Resp 17  Ht 5\' 8"  (1.727 m)  Wt 93.441 kg (206 lb)  BMI 31.33 kg/m2  SpO2 98%  Intake/Output Summary (Last 24 hours) at 01/17/14 0913 Last data filed at 01/17/14 0532  Gross per 24 hour  Intake    270 ml  Output   1550 ml  Net  -1280 ml   Weight change: -2.259 kg (-4 lb 15.7 oz) Gen: Awake and alert CVS: Tachy, reg Resp: Decreased BS throughout, rare wheeze Abd: + BS NTND Ext: trace edema NEURO:    CNI.  + asterixis  . antiseptic oral rinse  7 mL Mouth Rinse BID  . aspirin EC  81 mg Oral Daily  . feeding supplement (NEPRO CARB STEADY)  237 mL Oral BID BM  . folic acid  1 mg Oral Daily  . heparin  5,000 Units Subcutaneous 3 times per day  . latanoprost  1 drop Both Eyes QHS  . levothyroxine  25 mcg Oral QAC breakfast  . multivitamin with minerals  1 tablet Oral Daily  . pantoprazole  40 mg Oral BID  . pilocarpine  1 drop Right Eye QID  . senna-docusate  1 tablet Oral BID  . thiamine  100 mg Oral Daily   No results found. BMET    Component Value Date/Time   NA 149* 01/17/2014 0530   K 4.7 01/17/2014 0530   CL 112 01/17/2014 0530   CO2 21 01/17/2014 0530   GLUCOSE 102* 01/17/2014 0530   BUN 49* 01/17/2014 0530   CREATININE 8.09* 01/17/2014 0530   CALCIUM 8.9 01/17/2014 0530   GFRNONAA 4* 01/17/2014 0530   GFRAA 5* 01/17/2014 0530   CBC    Component Value Date/Time   WBC 8.1 01/17/2014 0530   RBC 3.14* 01/17/2014 0530   HGB 8.9* 01/17/2014 0530   HCT 29.2* 01/17/2014 0530   PLT 214 01/17/2014 0530   MCV 93.0 01/17/2014 0530   MCH 28.3 01/17/2014 0530   MCHC 30.5 01/17/2014 0530   RDW 14.7 01/17/2014 0530   LYMPHSABS 1.3 01/11/2014 0535   MONOABS 1.1* 01/11/2014 0535   EOSABS 1.1* 01/11/2014 0535   BASOSABS 0.1 01/11/2014 0535     Assessment: 1.  Presumably ARF with good UO.  (Renal fx nl with Scr 0.9 last May.)  Scr stable for first time so hopefully  this means renal fx will start improving. 2.  COPD 3. Hx HTN  Plan: 1.  DC foley 2. Increase ambulation 3. Daily Scr    Sherilynn Dieu T

## 2014-01-18 LAB — RENAL FUNCTION PANEL
ANION GAP: 16 — AB (ref 5–15)
Albumin: 2.4 g/dL — ABNORMAL LOW (ref 3.5–5.2)
BUN: 52 mg/dL — ABNORMAL HIGH (ref 6–23)
CHLORIDE: 109 meq/L (ref 96–112)
CO2: 21 meq/L (ref 19–32)
Calcium: 9 mg/dL (ref 8.4–10.5)
Creatinine, Ser: 7.97 mg/dL — ABNORMAL HIGH (ref 0.50–1.10)
GFR, EST AFRICAN AMERICAN: 5 mL/min — AB (ref >90)
GFR, EST NON AFRICAN AMERICAN: 4 mL/min — AB (ref >90)
GLUCOSE: 96 mg/dL (ref 70–99)
POTASSIUM: 4.5 meq/L (ref 3.7–5.3)
Phosphorus: 7.3 mg/dL — ABNORMAL HIGH (ref 2.3–4.6)
Sodium: 146 mEq/L (ref 137–147)

## 2014-01-18 NOTE — Clinical Social Work Note (Signed)
Psychosocial assessment completed with daughter on 12/3. Full assessment to follow. [facility] CSW will follow-up with family to provide facility responses.  Genelle BalVanessa Jaquon Gingerich, MSW, LCSW [phone]

## 2014-01-18 NOTE — Progress Notes (Signed)
[facility]                                                                              Patient Demographics  [name], is a 79 y.o. female, DOB - 16-Jul-1934, OZH:[id number]  Admit date - 01/08/2014   Admitting Physician Marinda ElkAbraham Feliz Ortiz, MD  Outpatient Primary MD for the patient is No primary care provider on file.  LOS - 10   No chief complaint on file.     HPI on 01/08/2014 by Dr. Freda MunroSaadat Khan  [name] is a 79 y.o. female presents as a transfer from [facility]. Not a reliable historian and no family is present. Patient apparently has a past history of COPD presented to the ED with complaints of diarrhea. She states that this has been going on for a while. She states that she has had some abdominal pain associated. She also notes decreased urine output. She admits to having shortness of breath, She has no prior history of kidney disease. At [facility] she was noted to have hyperkalemia with a potassium of 9 this was aggressively treated and came down minimally to a final reading of 8 prior to transfer. Patient also was noted to have a creatinine of 14 and therefore [facility]  Interim history Nephrology was consulted and determined patient needed urgent hemodialysis. Temporary dialysis catheter was inserted by PCCM and patient underwent hemodialysis. Her potassium was corrected with dialysis, her creatinine also decreased to 6. Postdialysis, patient had issues with hypotension and required fluid challenges. On 11:30 patient's creatinine began to increase again, patient did have adequate urine output and continues to do so. She continued to have symptoms of encephalopathy which is likely secondary to her uremia. Nephrology is hopeful the patient's renal function will recover and patient will not R lifelong dialysis  Assessment & Plan   Acute renal failure/questionable ATN/hyperkalemia -Unknown etiology, possibly dehydration, diarrhea,  diuretic use -Baseline creatinine per daughter in May 2015 0.9 with a BUN of 17 -Hyperkalemia has resolved will continue to monitor BMP -Renal ultrasound showed mild medical renal disease -Creatinine today 7.97 (trending downward) -Nephrology consulted and appreciated, continue to trend and follow Cr -Patient has had good UO over the past 24hrs: 2200  Acute respiratory failure with hypoxia/pulmonary edema -Improved -Currently on 2 L oxygen -Per daughter at bedside, patient does require oxygen at home with ambulation -Will continue to monitor  Left upper extremity SVT -No DVT noted on Doppler -Continue conservative management, keep arm elevated  Hypotension -Resolved, no evidence of sepsis or infection -Echocardiogram showed no significant pericardial effusion  Acute encephalopathy -Resolved, per daughter patient is at her baseline. -Likely secondary to acute renal failure with uremia  Diarrhea/constipation -Patient had diarrhea before admission however now appears to be constipated -C. difficile PCR negative -KUB on 01/13/2014 showed no evidence of bowel obstruction or ileus -Continue bowel regimen  COPD/cough -Appears stable  Hypertension -Patient was on Tenormin and Lasix at home, which have been held during her hospitalization  Hypothyroidism -TSH 1.8 -Continue Synthroid  GERD -Continue Protonix  Deconditioning -PT consulted and recommended nursing home placement  Code Status: Full  Family Communication: Daughter at bedside  Disposition Plan: Admitted  Time Spent in minutes   25 minutes  Procedures  Insertion of hemodialysis catheter 01/08/2014 Echocardiogram on 01/09/2014: EF 55%, grade 1 diastolic dysfunction  Consults   PCCM Nephrology  DVT Prophylaxis  Heparin  Lab Results  Component Value Date   PLT 214 01/17/2014    Medications  Scheduled Meds: . antiseptic oral rinse  7 mL Mouth Rinse BID  . aspirin EC  81 mg Oral Daily  . feeding  supplement (NEPRO CARB STEADY)  237 mL Oral BID BM  . folic acid  1 mg Oral Daily  . heparin  5,000 Units Subcutaneous 3 times per day  . latanoprost  1 drop Both Eyes QHS  . levothyroxine  25 mcg Oral QAC breakfast  . multivitamin with minerals  1 tablet Oral Daily  . pantoprazole  40 mg Oral BID  . pilocarpine  1 drop Right Eye QID  . senna-docusate  1 tablet Oral BID  . thiamine  100 mg Oral Daily   Continuous Infusions:  PRN Meds:.sodium chloride, sodium chloride, acetaminophen **OR** acetaminophen, albuterol, feeding supplement (NEPRO CARB STEADY), heparin, lidocaine (PF), lidocaine-prilocaine, ondansetron **OR** ondansetron (ZOFRAN) IV, pentafluoroprop-tetrafluoroeth, sodium chloride  Antibiotics    Anti-infectives    Start     Dose/Rate Route Frequency Ordered Stop   01/08/14 1945  cefTRIAXone (ROCEPHIN) 1 g in dextrose 5 % 50 mL IVPB - Premix  Status:  Discontinued     1 g100 mL/hr over 30 Minutes Intravenous Every 24 hours 01/08/14 1933 01/11/14 1854        Subjective:   [name] seen and examined today.  Patient states she is feeling better.  She really does not want to undergo dialysis again. Denies chest pain, shortness of breath, abdominal pain.  Objective:   Filed Vitals:   01/17/14 1802 01/17/14 2100 01/18/14 0541 01/18/14 1000  BP: 144/63 124/67 129/71 132/73  Pulse: 108 104 112 40  Temp: 97.6 F (36.4 C) 98.4 F (36.9 C) 98.1 F (36.7 C) 99 F (37.2 C)  TempSrc: Oral Oral Oral Oral  Resp: 18 17 19 18   Height:      Weight:  90.946 kg (200 lb 8 oz)    SpO2: 98% 99% 98% 99%    Wt Readings from Last 3 Encounters:  01/17/14 90.946 kg (200 lb 8 oz)     Intake/Output Summary (Last 24 hours) at 01/18/14 1218 Last data filed at 01/18/14 0900  Gross per 24 hour  Intake    840 ml  Output   1500 ml  Net   -660 ml    Exam  General: Well developed, well nourished, NAD, appears stated age  HEENT: NCAT,  mucous membranes moist.   Neck: Supple,  right IJ HD cath  Cardiovascular: S1 S2 auscultated, regular rate and rhythm  Respiratory: Clear to ausculutation  Abdomen: Soft, nontender, nondistended, + bowel sounds  Extremities: warm dry without cyanosis clubbing, edema  Neuro: AAOx3, no focal deficits  Psych: Appropriate mood and affect  Data Review   Micro Results Recent Results (from the past 240 hour(s))  MRSA PCR Screening     Status: None   Collection Time: 01/08/14  8:38 PM  Result Value Ref Range Status   MRSA by PCR NEGATIVE NEGATIVE Final    Comment:        The GeneXpert MRSA Assay (FDA approved for NASAL specimens only), is one component of a comprehensive MRSA colonization surveillance program. It is not intended to diagnose MRSA  infection nor to guide or monitor treatment for MRSA infections.   Urine culture     Status: None   Collection Time: 01/08/14 10:28 PM  Result Value Ref Range Status   Specimen Description URINE, CATHETERIZED  Final   Special Requests NONE  Final   Culture  Setup Time   Final    01/08/2014 23:11 Performed at [facility]    Colony Count NO GROWTH Performed at [facility]   Final   Culture NO GROWTH Performed at [facility]   Final   Report Status 01/10/2014 FINAL  Final  Culture, blood (routine x 2)     Status: None   Collection Time: 01/09/14  1:25 PM  Result Value Ref Range Status   Specimen Description BLOOD LEFT ANTECUBITAL  Final   Special Requests BOTTLES DRAWN AEROBIC AND ANAEROBIC 10CC  Final   Culture  Setup Time   Final    01/09/2014 18:02 Performed at [facility]    Culture   Final    NO GROWTH 5 DAYS Performed at [facility]    Report Status 01/15/2014 FINAL  Final  Culture, blood (routine x 2)     Status: None   Collection Time: 01/09/14  1:30 PM  Result Value Ref Range Status   Specimen Description BLOOD RIGHT IV START  Final   Special Requests BOTTLES DRAWN AEROBIC AND ANAEROBIC 10CC  Final    Culture  Setup Time   Final    01/09/2014 18:02 Performed at [facility]    Culture   Final    NO GROWTH 5 DAYS Performed at [facility]    Report Status 01/15/2014 FINAL  Final  Clostridium Difficile by PCR     Status: None   Collection Time: 01/09/14  1:50 PM  Result Value Ref Range Status   C difficile by pcr NEGATIVE NEGATIVE Final    Radiology Reports [facility]  01/08/2014   CLINICAL DATA:  Acute renal failure  EXAM: RENAL/URINARY TRACT ULTRASOUND COMPLETE  COMPARISON:  None.  FINDINGS: Right Kidney:  Length: 15 cm. Increased renal cortical echogenicity. No mass or hydronephrosis visualized.  Left Kidney:  Length: 12.3 cm. Increased renal cortical echogenicity. No mass or hydronephrosis visualized.  Bladder:  Appears normal for degree of bladder distention.  IMPRESSION: 1. No obstructive uropathy. 2. Increased renal cortical echogenicity as can be seen with medical renal disease.   Electronically Signed   By: Elige KoHetal  Patel   On: 01/08/2014 21:15   Dg Chest Port 1 View  01/09/2014   CLINICAL DATA:  Hypoxia  EXAM: PORTABLE CHEST - 1 VIEW  COMPARISON:  01/08/2014  FINDINGS: Cardiomediastinal silhouette is stable. Mild elevation of the left hemidiaphragm. There is central mild vascular congestion and mild interstitial prominence suspicious for mild interstitial edema. Small left pleural effusion with left basilar atelectasis or infiltrate. Right IJ sheath is unchanged in position.  IMPRESSION: Mild vascular congestion mild interstitial prominence suspicious for mild interstitial edema. Small left pleural effusion with left basilar atelectasis or infiltrate.   Electronically Signed   By: Natasha MeadLiviu  Pop M.D.   On: 01/09/2014 10:45   Dg Chest Port 1 View  01/08/2014   CLINICAL DATA:  Evaluate central line placement.  EXAM: PORTABLE CHEST - 1 VIEW  COMPARISON:  01/08/2014  FINDINGS: There is a right IJ catheter with tip in the projection of the SVC. No pneumothorax identified. Mild  cardiac enlargement. There is calcified plaque identified within the aortic  arch. Pulmonary vascular congestion is noted. There is bibasilar atelectasis.  IMPRESSION: 1. Tip of the IJ catheter is in the SVC.  No pneumothorax. 2. Cardiac enlargement, atherosclerotic disease and pulmonary vascular congestion.   Electronically Signed   By: Signa Kell M.D.   On: 01/08/2014 23:52   Dg Abd Portable 1v  01/13/2014   CLINICAL DATA:  Generalized abdominal pain, nausea.  EXAM: PORTABLE ABDOMEN - 1 VIEW  COMPARISON:  January 08, 2014.  FINDINGS: The bowel gas pattern is normal. No radio-opaque calculi or other significant radiographic abnormality are seen.  IMPRESSION: No evidence of bowel obstruction or ileus.   Electronically Signed   By: Roque Lias M.D.   On: 01/13/2014 18:18    CBC  Recent Labs Lab 01/12/14 0203 01/13/14 0500 01/14/14 0430 01/17/14 0530  WBC 11.4* 11.8* 11.1* 8.1  HGB 10.3* 10.1* 9.9* 8.9*  HCT 31.7* 31.9* 31.4* 29.2*  PLT 236 281 269 214  MCV 90.6 92.2 90.8 93.0  MCH 29.4 29.2 28.6 28.3  MCHC 32.5 31.7 31.5 30.5  RDW 14.6 14.6 15.1 14.7    Chemistries   Recent Labs Lab 01/13/14 0500 01/14/14 0430 01/15/14 0535 01/16/14 0845 01/17/14 0530 01/18/14 0519  NA 142 144 143 147 149* 146  K 5.4* 5.4* 5.1 4.9 4.7 4.5  CL 102 106 105 112 112 109  CO2 18* 18* 24 24 21 21   GLUCOSE 94 102* 91 94 102* 96  BUN 58* 59* 41* 46* 49* 52*  CREATININE 12.00* 11.88* 7.83* 8.18* 8.09* 7.97*  CALCIUM 7.7* 8.4 8.5 8.7 8.9 9.0  AST 6  --   --   --   --   --   ALT 6  --   --   --   --   --   ALKPHOS 66  --   --   --   --   --   BILITOT <0.2*  --   --   --   --   --    ------------------------------------------------------------------------------------------------------------------ estimated creatinine clearance is 7 mL/min (by C-G formula based on Cr of  7.97). ------------------------------------------------------------------------------------------------------------------ No results for input(s): HGBA1C in the last 72 hours. ------------------------------------------------------------------------------------------------------------------ No results for input(s): CHOL, HDL, LDLCALC, TRIG, CHOLHDL, LDLDIRECT in the last 72 hours. ------------------------------------------------------------------------------------------------------------------ No results for input(s): TSH, T4TOTAL, T3FREE, THYROIDAB in the last 72 hours.  Invalid input(s): FREET3 ------------------------------------------------------------------------------------------------------------------ No results for input(s): VITAMINB12, FOLATE, FERRITIN, TIBC, IRON, RETICCTPCT in the last 72 hours.  Coagulation profile No results for input(s): INR, PROTIME in the last 168 hours.  No results for input(s): DDIMER in the last 72 hours.  Cardiac Enzymes No results for input(s): CKMB, TROPONINI, MYOGLOBIN in the last 168 hours.  Invalid input(s): CK ------------------------------------------------------------------------------------------------------------------ Invalid input(s): POCBNP    Dontrell Stuck D.O. on 01/18/2014 at 12:18 PM  Between 7am to 7pm - Pager - [phone]  After 7pm go to [email or web address] - password [email or web address]  And look for the night coverage person covering for me after hours  [facility] Office  [phone]

## 2014-01-18 NOTE — Progress Notes (Signed)
S: Int nausea but no vomiting.  + fatigue O:BP 132/73 mmHg  Pulse 40  Temp(Src) 99 F (37.2 C) (Oral)  Resp 18  Ht 5\' 8"  (1.727 m)  Wt 90.946 kg (200 lb 8 oz)  BMI 30.49 kg/m2  SpO2 99%  Intake/Output Summary (Last 24 hours) at 01/18/14 1008 Last data filed at 01/18/14 0605  Gross per 24 hour  Intake    600 ml  Output   2200 ml  Net  -1600 ml   Weight change: -2.495 kg (-5 lb 8 oz) Gen: Awake and alert CVS: Tachy, reg Resp: Decreased BS throughout Abd: + BS NTND Ext: no edema NEURO: CNI Ox3  + asterixis  . antiseptic oral rinse  7 mL Mouth Rinse BID  . aspirin EC  81 mg Oral Daily  . feeding supplement (NEPRO CARB STEADY)  237 mL Oral BID BM  . folic acid  1 mg Oral Daily  . heparin  5,000 Units Subcutaneous 3 times per day  . latanoprost  1 drop Both Eyes QHS  . levothyroxine  25 mcg Oral QAC breakfast  . multivitamin with minerals  1 tablet Oral Daily  . pantoprazole  40 mg Oral BID  . pilocarpine  1 drop Right Eye QID  . senna-docusate  1 tablet Oral BID  . thiamine  100 mg Oral Daily   No results found. BMET    Component Value Date/Time   NA 146 01/18/2014 0519   K 4.5 01/18/2014 0519   CL 109 01/18/2014 0519   CO2 21 01/18/2014 0519   GLUCOSE 96 01/18/2014 0519   BUN 52* 01/18/2014 0519   CREATININE 7.97* 01/18/2014 0519   CALCIUM 9.0 01/18/2014 0519   GFRNONAA 4* 01/18/2014 0519   GFRAA 5* 01/18/2014 0519   CBC    Component Value Date/Time   WBC 8.1 01/17/2014 0530   RBC 3.14* 01/17/2014 0530   HGB 8.9* 01/17/2014 0530   HCT 29.2* 01/17/2014 0530   PLT 214 01/17/2014 0530   MCV 93.0 01/17/2014 0530   MCH 28.3 01/17/2014 0530   MCHC 30.5 01/17/2014 0530   RDW 14.7 01/17/2014 0530   LYMPHSABS 1.3 01/11/2014 0535   MONOABS 1.1* 01/11/2014 0535   EOSABS 1.1* 01/11/2014 0535   BASOSABS 0.1 01/11/2014 0535     Assessment: 1.  Presumably ARF with good UO.  (Renal fx nl with Scr 0.9 last May.)  Scr fractionally lower 2.  COPD on home O2 3. Hx  HTN  Plan: 1.  Discussed another tx of HD or cont to monitor as I think renal fx is starting to improve.  She wants to hold off on anymore HD for now 2. Recheck Scr in AM  Surabhi Gadea T

## 2014-01-19 LAB — RENAL FUNCTION PANEL
Albumin: 2.3 g/dL — ABNORMAL LOW (ref 3.5–5.2)
Anion gap: 15 (ref 5–15)
BUN: 53 mg/dL — ABNORMAL HIGH (ref 6–23)
CO2: 23 mEq/L (ref 19–32)
Calcium: 9.5 mg/dL (ref 8.4–10.5)
Chloride: 108 mEq/L (ref 96–112)
Creatinine, Ser: 7.8 mg/dL — ABNORMAL HIGH (ref 0.50–1.10)
GFR, EST AFRICAN AMERICAN: 5 mL/min — AB (ref >90)
GFR, EST NON AFRICAN AMERICAN: 4 mL/min — AB (ref >90)
GLUCOSE: 90 mg/dL (ref 70–99)
PHOSPHORUS: 7.3 mg/dL — AB (ref 2.3–4.6)
POTASSIUM: 4.1 meq/L (ref 3.7–5.3)
SODIUM: 146 meq/L (ref 137–147)

## 2014-01-19 NOTE — Progress Notes (Signed)
S: Eating a little better O:BP 110/51 mmHg  Pulse 98  Temp(Src) 98.6 F (37 C) (Oral)  Resp 18  Ht 5\' 8"  (1.727 m)  Wt 91.808 kg (202 lb 6.4 oz)  BMI 30.78 kg/m2  SpO2 97%  Intake/Output Summary (Last 24 hours) at 01/19/14 0942 Last data filed at 01/19/14 0919  Gross per 24 hour  Intake    250 ml  Output   1425 ml  Net  -1175 ml   Weight change: 0.862 kg (1 lb 14.4 oz) Gen: Awake and alert CVS: Tachy, reg Resp: Decreased BS throughout Abd: + BS NTND Ext: no edema NEURO: CNI Ox3  + asterixis  . antiseptic oral rinse  7 mL Mouth Rinse BID  . aspirin EC  81 mg Oral Daily  . feeding supplement (NEPRO CARB STEADY)  237 mL Oral BID BM  . folic acid  1 mg Oral Daily  . heparin  5,000 Units Subcutaneous 3 times per day  . latanoprost  1 drop Both Eyes QHS  . levothyroxine  25 mcg Oral QAC breakfast  . multivitamin with minerals  1 tablet Oral Daily  . pantoprazole  40 mg Oral BID  . pilocarpine  1 drop Right Eye QID  . senna-docusate  1 tablet Oral BID  . thiamine  100 mg Oral Daily   No results found. BMET    Component Value Date/Time   NA 146 01/19/2014 0714   K 4.1 01/19/2014 0714   CL 108 01/19/2014 0714   CO2 23 01/19/2014 0714   GLUCOSE 90 01/19/2014 0714   BUN 53* 01/19/2014 0714   CREATININE 7.80* 01/19/2014 0714   CALCIUM 9.5 01/19/2014 0714   GFRNONAA 4* 01/19/2014 0714   GFRAA 5* 01/19/2014 0714   CBC    Component Value Date/Time   WBC 8.1 01/17/2014 0530   RBC 3.14* 01/17/2014 0530   HGB 8.9* 01/17/2014 0530   HCT 29.2* 01/17/2014 0530   PLT 214 01/17/2014 0530   MCV 93.0 01/17/2014 0530   MCH 28.3 01/17/2014 0530   MCHC 30.5 01/17/2014 0530   RDW 14.7 01/17/2014 0530   LYMPHSABS 1.3 01/11/2014 0535   MONOABS 1.1* 01/11/2014 0535   EOSABS 1.1* 01/11/2014 0535   BASOSABS 0.1 01/11/2014 0535     Assessment: 1.  Presumably ARF with good UO.  (Renal fx nl with Scr 0.9 last May.)  Scr fractionally lower again 2.  COPD on home O2 3. Hx  HTN  Plan: 1.  Recheck labs in AM 2. Increase ambulation  Lisa Blakeman T

## 2014-01-19 NOTE — Progress Notes (Signed)
[facility]                                                                              Patient Demographics  [name], is a 79 y.o. female, DOB - [birth year], RUE:[id number]  Admit date - 01/08/2014   Admitting Physician Marinda ElkAbraham Feliz Ortiz, MD  Outpatient Primary MD for the patient is No primary care provider on file.  LOS - 11   No chief complaint on file.     HPI on 01/08/2014 by Dr. Freda MunroSaadat Khan  [name] is a 79 y.o. female presents as a transfer from [facility]. Not a reliable historian and no family is present. Patient apparently has a past history of COPD presented to the ED with complaints of diarrhea. She states that this has been going on for a while. She states that she has had some abdominal pain associated. She also notes decreased urine output. She admits to having shortness of breath, She has no prior history of kidney disease. At [facility] she was noted to have hyperkalemia with a potassium of 9 this was aggressively treated and came down minimally to a final reading of 8 prior to transfer. Patient also was noted to have a creatinine of 14 and therefore [facility].  Interim history Nephrology was consulted and determined patient needed urgent hemodialysis. Temporary dialysis catheter was inserted by PCCM and patient underwent hemodialysis. Her potassium was corrected with dialysis, her creatinine also decreased to 6. Postdialysis, patient had issues with hypotension and required fluid challenges. On 11:30 patient's creatinine began to increase again, patient did have adequate urine output and continues to do so. She continued to have symptoms of encephalopathy which is likely secondary to her uremia. Nephrology is hopeful the patient's renal function will recover and patient will not R lifelong dialysis  Assessment & Plan   Acute renal failure/questionable ATN/hyperkalemia -Unknown etiology, possibly dehydration, diarrhea,  diuretic use -Baseline creatinine per daughter in May 2015 0.9 with a BUN of 17 -Hyperkalemia has resolved will continue to monitor BMP -Renal ultrasound showed mild medical renal disease -Creatinine today 7.80 (trending downward) -Nephrology consulted and appreciated, continue to trend and follow Cr -Patient has had good UO over the past 24hrs: 1425  Acute respiratory failure with hypoxia/pulmonary edema -Improved -Currently on 2 L oxygen -Per daughter at bedside, patient does require oxygen at home with ambulation -Will continue to monitor  Left upper extremity SVT -No DVT noted on Doppler -Continue conservative management, keep arm elevated  Hypotension -Resolved, no evidence of sepsis or infection -Echocardiogram showed no significant pericardial effusion  Acute encephalopathy -Resolved, per daughter patient is at her baseline. -Likely secondary to acute renal failure with uremia  Diarrhea/constipation -Patient had diarrhea before admission however now appears to be constipated -C. difficile PCR negative -KUB on 01/13/2014 showed no evidence of bowel obstruction or ileus -Continue bowel regimen  COPD/cough -Appears stable  Hypertension -Patient was on Tenormin and Lasix at home, which have been held during her hospitalization  Hypothyroidism -TSH 1.8 -Continue Synthroid  GERD -Continue Protonix  Deconditioning -PT consulted and recommended nursing home placement  Code Status: Full  Family Communication: Daughter at bedside  Disposition Plan: Admitted  Time Spent in minutes   20 minutes  Procedures  Insertion of hemodialysis catheter 01/08/2014 Echocardiogram on 01/09/2014: EF 55%, grade 1 diastolic dysfunction  Consults   PCCM Nephrology  DVT Prophylaxis  Heparin  Lab Results  Component Value Date   PLT 214 01/17/2014    Medications  Scheduled Meds: . antiseptic oral rinse  7 mL Mouth Rinse BID  . aspirin EC  81 mg Oral Daily  . feeding  supplement (NEPRO CARB STEADY)  237 mL Oral BID BM  . folic acid  1 mg Oral Daily  . heparin  5,000 Units Subcutaneous 3 times per day  . latanoprost  1 drop Both Eyes QHS  . levothyroxine  25 mcg Oral QAC breakfast  . multivitamin with minerals  1 tablet Oral Daily  . pantoprazole  40 mg Oral BID  . pilocarpine  1 drop Right Eye QID  . senna-docusate  1 tablet Oral BID  . thiamine  100 mg Oral Daily   Continuous Infusions:  PRN Meds:.sodium chloride, sodium chloride, acetaminophen **OR** acetaminophen, albuterol, feeding supplement (NEPRO CARB STEADY), heparin, lidocaine (PF), lidocaine-prilocaine, ondansetron **OR** ondansetron (ZOFRAN) IV, pentafluoroprop-tetrafluoroeth, sodium chloride  Antibiotics    Anti-infectives    Start     Dose/Rate Route Frequency Ordered Stop   01/08/14 1945  cefTRIAXone (ROCEPHIN) 1 g in dextrose 5 % 50 mL IVPB - Premix  Status:  Discontinued     1 g100 mL/hr over 30 Minutes Intravenous Every 24 hours 01/08/14 1933 01/11/14 1854        Subjective:   [name] seen and examined today.  Patient states she is feeling better.  She is afraid of going to rehab as they may cut off of her hair.  She would like to get out of bed.  Denies chest pain, shortness of breath, abdominal pain.  Objective:   Filed Vitals:   01/18/14 1400 01/18/14 1734 01/18/14 2130 01/19/14 0444  BP: 127/56 137/69 116/64 110/51  Pulse: 108 100 104 98  Temp: 98.2 F (36.8 C) 97.6 F (36.4 C) 98.7 F (37.1 C) 98.6 F (37 C)  TempSrc: Oral Oral Oral Oral  Resp: 18 18 17 18   Height:      Weight:   91.808 kg (202 lb 6.4 oz)   SpO2: 91% 100% 99% 97%    Wt Readings from Last 3 Encounters:  01/18/14 91.808 kg (202 lb 6.4 oz)     Intake/Output Summary (Last 24 hours) at 01/19/14 1610 Last data filed at 01/19/14 0919  Gross per 24 hour  Intake    250 ml  Output   1425 ml  Net  -1175 ml    Exam  General: Well developed, well nourished, NAD, appears stated  age  HEENT: NCAT,  mucous membranes moist.   Neck: Supple, right IJ HD cath  Cardiovascular: S1 S2 auscultated, regular rate and rhythm  Respiratory: Clear to ausculutation  Abdomen: Soft, nontender, nondistended, + bowel sounds  Extremities: warm dry without cyanosis clubbing, edema  Neuro: AAOx3, no focal deficits  Psych: Appropriate mood and affect  Data Review   Micro Results Recent Results (from the past 240 hour(s))  Culture, blood (routine x 2)     Status: None   Collection Time: 01/09/14  1:25 PM  Result Value Ref Range Status   Specimen Description BLOOD LEFT ANTECUBITAL  Final   Special Requests BOTTLES DRAWN AEROBIC AND ANAEROBIC 10CC  Final   Culture  Setup Time  Final    01/09/2014 18:02 Performed at [facility]    Culture   Final    NO GROWTH 5 DAYS Performed at [facility]    Report Status 01/15/2014 FINAL  Final  Culture, blood (routine x 2)     Status: None   Collection Time: 01/09/14  1:30 PM  Result Value Ref Range Status   Specimen Description BLOOD RIGHT IV START  Final   Special Requests BOTTLES DRAWN AEROBIC AND ANAEROBIC 10CC  Final   Culture  Setup Time   Final    01/09/2014 18:02 Performed at [facility]    Culture   Final    NO GROWTH 5 DAYS Performed at [facility]    Report Status 01/15/2014 FINAL  Final  Clostridium Difficile by PCR     Status: None   Collection Time: 01/09/14  1:50 PM  Result Value Ref Range Status   C difficile by pcr NEGATIVE NEGATIVE Final    Radiology Reports US Renal  01/08/2014   CLINICAL DATA:  Acute renal failure  EXAM: RENAL/URINARY TRACT ULTRASOUND COMPLETE  COMPARISON:  None.  FINDINGS: Right Kidney:  Length: 15 cm. Increased renal cortical echogenicity. No mass or hydronephrosis visualized.  Left Kidney:  Length: 12.3 cm. Increased renal cortical echogenicity. No mass or hydronephrosis visualized.  Bladder:  Appears normal for degree of bladder distention.   IMPRESSION: 1. No obstructive uropathy. 2. Increased renal cortical echogenicity as can be seen with medical renal disease.   Electronically Signed   By: Elige Ko   On: 01/08/2014 21:15   Dg Chest Port 1 View  01/09/2014   CLINICAL DATA:  Hypoxia  EXAM: PORTABLE CHEST - 1 VIEW  COMPARISON:  01/08/2014  FINDINGS: Cardiomediastinal silhouette is stable. Mild elevation of the left hemidiaphragm. There is central mild vascular congestion and mild interstitial prominence suspicious for mild interstitial edema. Small left pleural effusion with left basilar atelectasis or infiltrate. Right IJ sheath is unchanged in position.  IMPRESSION: Mild vascular congestion mild interstitial prominence suspicious for mild interstitial edema. Small left pleural effusion with left basilar atelectasis or infiltrate.   Electronically Signed   By: Natasha Mead M.D.   On: 01/09/2014 10:45   Dg Chest Port 1 View  01/08/2014   CLINICAL DATA:  Evaluate central line placement.  EXAM: PORTABLE CHEST - 1 VIEW  COMPARISON:  01/08/2014  FINDINGS: There is a right IJ catheter with tip in the projection of the SVC. No pneumothorax identified. Mild cardiac enlargement. There is calcified plaque identified within the aortic arch. Pulmonary vascular congestion is noted. There is bibasilar atelectasis.  IMPRESSION: 1. Tip of the IJ catheter is in the SVC.  No pneumothorax. 2. Cardiac enlargement, atherosclerotic disease and pulmonary vascular congestion.   Electronically Signed   By: Signa Kell M.D.   On: 01/08/2014 23:52   Dg Abd Portable 1v  01/13/2014   CLINICAL DATA:  Generalized abdominal pain, nausea.  EXAM: PORTABLE ABDOMEN - 1 VIEW  COMPARISON:  January 08, 2014.  FINDINGS: The bowel gas pattern is normal. No radio-opaque calculi or other significant radiographic abnormality are seen.  IMPRESSION: No evidence of bowel obstruction or ileus.   Electronically Signed   By: Roque Lias M.D.   On: 01/13/2014 18:18     CBC  Recent Labs Lab 01/13/14 0500 01/14/14 0430 01/17/14 0530  WBC 11.8* 11.1* 8.1  HGB 10.1* 9.9* 8.9*  HCT 31.9* 31.4* 29.2*  PLT 281 269 214  MCV 92.2 90.8 93.0  MCH 29.2 28.6 28.3  MCHC 31.7 31.5 30.5  RDW 14.6 15.1 14.7    Chemistries   Recent Labs Lab 01/13/14 0500  01/15/14 0535 01/16/14 0845 01/17/14 0530 01/18/14 0519 01/19/14 0714  NA 142  < > 143 147 149* 146 146  K 5.4*  < > 5.1 4.9 4.7 4.5 4.1  CL 102  < > 105 112 112 109 108  CO2 18*  < > 24 24 21 21 23   GLUCOSE 94  < > 91 94 102* 96 90  BUN 58*  < > 41* 46* 49* 52* 53*  CREATININE 12.00*  < > 7.83* 8.18* 8.09* 7.97* 7.80*  CALCIUM 7.7*  < > 8.5 8.7 8.9 9.0 9.5  AST 6  --   --   --   --   --   --   ALT 6  --   --   --   --   --   --   ALKPHOS 66  --   --   --   --   --   --   BILITOT <0.2*  --   --   --   --   --   --   < > = values in this interval not displayed. ------------------------------------------------------------------------------------------------------------------ estimated creatinine clearance is 7.2 mL/min (by C-G formula based on Cr of 7.8). ------------------------------------------------------------------------------------------------------------------ No results for input(s): HGBA1C in the last 72 hours. ------------------------------------------------------------------------------------------------------------------ No results for input(s): CHOL, HDL, LDLCALC, TRIG, CHOLHDL, LDLDIRECT in the last 72 hours. ------------------------------------------------------------------------------------------------------------------ No results for input(s): TSH, T4TOTAL, T3FREE, THYROIDAB in the last 72 hours.  Invalid input(s): FREET3 ------------------------------------------------------------------------------------------------------------------ No results for input(s): VITAMINB12, FOLATE, FERRITIN, TIBC, IRON, RETICCTPCT in the last 72 hours.  Coagulation profile No results for  input(s): INR, PROTIME in the last 168 hours.  No results for input(s): DDIMER in the last 72 hours.  Cardiac Enzymes No results for input(s): CKMB, TROPONINI, MYOGLOBIN in the last 168 hours.  Invalid input(s): CK ------------------------------------------------------------------------------------------------------------------ Invalid input(s): POCBNP    Chava Dulac D.O. on 01/19/2014 at 9:39 AM  Between 7am to 7pm - Pager - [phone]  After 7pm go to [email or web address] - password [email or web address]  And look for the night coverage person covering for me after hours  [facility] Office  [phone]

## 2014-01-19 NOTE — Clinical Social Work Note (Signed)
CSW continues to follow patient for d/c planning needs. CSW contacted patient's daughter Drinda Butts(Annette) and provided bed offers: [facility], and [facility]. Drinda Buttsnnette states [facility]. CSW made daughter aware facility has not offered a bed. Per Drinda ButtsAnnette, she is not going to send her mother somewhere she has not visited and does not know anything about. CSW encouraged Drinda Buttsnnette to tour facilities and discussed making a decision on facility as soon as possible due to bed availability. Drinda Buttsnnette states she will discuss bed offers with her brother, tour facilities and follow-up with CSW. CSW to continue to follow patient.  Zykera Abella Patrick-Jefferson, LCSWA Weekend Clinical Social Worker [phone]

## 2014-01-20 LAB — CBC
HCT: 31.1 % — ABNORMAL LOW (ref 36.0–46.0)
Hemoglobin: 9.6 g/dL — ABNORMAL LOW (ref 12.0–15.0)
MCH: 29.4 pg (ref 26.0–34.0)
MCHC: 30.9 g/dL (ref 30.0–36.0)
MCV: 95.1 fL (ref 78.0–100.0)
Platelets: 198 10*3/uL (ref 150–400)
RBC: 3.27 MIL/uL — ABNORMAL LOW (ref 3.87–5.11)
RDW: 14.8 % (ref 11.5–15.5)
WBC: 8.8 10*3/uL (ref 4.0–10.5)

## 2014-01-20 LAB — BASIC METABOLIC PANEL
ANION GAP: 16 — AB (ref 5–15)
BUN: 56 mg/dL — ABNORMAL HIGH (ref 6–23)
CALCIUM: 9.6 mg/dL (ref 8.4–10.5)
CO2: 21 mEq/L (ref 19–32)
Chloride: 108 mEq/L (ref 96–112)
Creatinine, Ser: 7.35 mg/dL — ABNORMAL HIGH (ref 0.50–1.10)
GFR calc Af Amer: 6 mL/min — ABNORMAL LOW (ref >90)
GFR calc non Af Amer: 5 mL/min — ABNORMAL LOW (ref >90)
GLUCOSE: 104 mg/dL — AB (ref 70–99)
Potassium: 3.7 mEq/L (ref 3.7–5.3)
SODIUM: 145 meq/L (ref 137–147)

## 2014-01-20 NOTE — Progress Notes (Signed)
[facility]                                                                              Patient Demographics  [name], is a 79 y.o. female, DOB - [birth year], ZOX:[id number]  Admit date - 01/08/2014   Admitting Physician Marinda Elk, MD  Outpatient Primary MD for the patient is No primary care provider on file.  LOS - 12   No chief complaint on file.     HPI on 01/08/2014 by Dr. Freda Strong  [name] is a 79 y.o. female presents as a transfer from [place]. Not a reliable historian and no family is present. Patient apparently has a past history of COPD presented to the ED with complaints of diarrhea. She states that this has been going on for a while. She states that she has had some abdominal pain associated. She also notes decreased urine output. She admits to having shortness of breath, She has no prior history of kidney disease. At [place] [facility] she was noted to have hyperkalemia with a potassium of 9 this was aggressively treated and came down minimally to a final reading of 8 prior to transfer. Patient also was noted to have a creatinine of 14 and therefore she was transferred to [facility] for further management.  Interim history Nephrology was consulted and determined patient needed urgent hemodialysis. Temporary dialysis catheter was inserted by PCCM and patient underwent hemodialysis. Her potassium was corrected with dialysis, her creatinine also decreased to 6. Postdialysis, patient had issues with hypotension and required fluid challenges. On 11:30 patient's creatinine began to increase again, patient did have adequate urine output and continues to do so. She continued to have symptoms of encephalopathy which is likely secondary to her uremia. Nephrology is hopeful the patient's renal function will recover and patient will not R lifelong dialysis  Assessment & Plan   Acute renal failure/questionable ATN/hyperkalemia -Unknown etiology, possibly dehydration, diarrhea,  diuretic use -Baseline creatinine per daughter in May 2015 0.9 with a BUN of 17 -Hyperkalemia has resolved will continue to monitor BMP -Renal ultrasound showed mild medical renal disease -Creatinine today 7.35 (trending downward) -Nephrology consulted and appreciated, continue to trend and follow Cr -Patient has had good UO over the past 24hrs: 1425  Acute respiratory failure with hypoxia/pulmonary edema -Improved -Currently on 2 L oxygen -Per daughter at bedside, patient does require oxygen at home with ambulation -Will continue to monitor  Left upper extremity SVT -No DVT noted on Doppler -Continue conservative management, keep arm elevated  Hypotension -Resolved, no evidence of sepsis or infection -Echocardiogram showed no significant pericardial effusion  Acute encephalopathy -Resolved, per daughter patient is at her baseline. -Likely secondary to acute renal failure with uremia  Diarrhea/constipation -Patient had diarrhea before admission however now appears to be constipated -C. difficile PCR negative -KUB on 01/13/2014 showed no evidence of bowel obstruction or ileus -Continue bowel regimen  COPD/cough -Appears stable  Hypertension -Patient was on Tenormin and Lasix at home, which have been held during her hospitalization  Hypothyroidism -TSH 1.8 -Continue Synthroid  GERD -Continue Protonix  Deconditioning -PT consulted and recommended nursing home placement  Code Status: Full  Family Communication: Daughter at bedside  Disposition Plan: Admitted, if patient's creatinine continues to trend downward, will likely discharge to SNF on 01/21/2014  Time Spent in minutes   20 minutes  Procedures  Insertion of hemodialysis catheter 01/08/2014 Echocardiogram on 01/09/2014: EF 55%, grade 1 diastolic dysfunction  Consults   PCCM Nephrology  DVT Prophylaxis  Heparin  Lab Results  Component Value Date   PLT 198 01/20/2014    Medications  Scheduled  Meds: . antiseptic oral rinse  7 mL Mouth Rinse BID  . aspirin EC  81 mg Oral Daily  . feeding supplement (NEPRO CARB STEADY)  237 mL Oral BID BM  . folic acid  1 mg Oral Daily  . heparin  5,000 Units Subcutaneous 3 times per day  . latanoprost  1 drop Both Eyes QHS  . levothyroxine  25 mcg Oral QAC breakfast  . multivitamin with minerals  1 tablet Oral Daily  . pantoprazole  40 mg Oral BID  . pilocarpine  1 drop Right Eye QID  . senna-docusate  1 tablet Oral BID  . thiamine  100 mg Oral Daily   Continuous Infusions:  PRN Meds:.sodium chloride, sodium chloride, acetaminophen **OR** acetaminophen, albuterol, feeding supplement (NEPRO CARB STEADY), heparin, lidocaine (PF), lidocaine-prilocaine, ondansetron **OR** ondansetron (ZOFRAN) IV, pentafluoroprop-tetrafluoroeth, sodium chloride  Antibiotics    Anti-infectives    Start     Dose/Rate Route Frequency Ordered Stop   01/08/14 1945  cefTRIAXone (ROCEPHIN) 1 g in dextrose 5 % 50 mL IVPB - Premix  Status:  Discontinued     1 g100 mL/hr over 30 Minutes Intravenous Every 24 hours 01/08/14 1933 01/11/14 1854        Subjective:   [name] seen and examined today.  Patient states she is feeling better and has no complaints.  Per daughter, patient did not sleep well last night.   Denies chest pain, shortness of breath, abdominal pain.  Objective:   Filed Vitals:   01/19/14 1000 01/19/14 2117 01/20/14 0444 01/20/14 0956  BP: 122/66 110/83 112/66 124/59  Pulse: 99 105 42 102  Temp: 98.4 F (36.9 C) 97.9 F (36.6 C) 99.2 F (37.3 C) 97.8 F (36.6 C)  TempSrc: Oral Oral Oral Oral  Resp: 18 17 18 18   Height:      Weight:  88.769 kg (195 lb 11.2 oz)    SpO2: 98% 100% 98% 99%    Wt Readings from Last 3 Encounters:  01/19/14 88.769 kg (195 lb 11.2 oz)     Intake/Output Summary (Last 24 hours) at 01/20/14 1019 Last data filed at 01/20/14 0700  Gross per 24 hour  Intake    600 ml  Output   1380 ml  Net   -780 ml     Exam  General: Well developed, well nourished, NAD, appears stated age  HEENT: NCAT,  mucous membranes moist.   Neck: Supple, right IJ HD cath  Cardiovascular: S1 S2 auscultated, regular rate and rhythm  Respiratory: Clear to ausculutation  Abdomen: Soft, nontender, nondistended, + bowel sounds  Extremities: warm dry without cyanosis clubbing, edema  Neuro: AAOx3, no focal deficits  Psych: Appropriate mood and affect  Data Review   Micro Results No results found for this or any previous visit (from the past 240 hour(s)).  Radiology Reports [place] Renal  01/08/2014   CLINICAL DATA:  Acute renal failure  EXAM: RENAL/URINARY TRACT ULTRASOUND COMPLETE  COMPARISON:  None.  FINDINGS: Right Kidney:  Length: 15 cm. Increased renal cortical echogenicity. No mass or hydronephrosis visualized.  Left Kidney:  Length: 12.3 cm. Increased renal cortical echogenicity. No mass or hydronephrosis visualized.  Bladder:  Appears normal for degree of bladder distention.  IMPRESSION: 1. No obstructive uropathy. 2. Increased renal cortical echogenicity as can be seen with medical renal disease.   Electronically Signed   By: Elige KoHetal  Patel   On: 01/08/2014 21:15   Dg Chest Port 1 View  01/09/2014   CLINICAL DATA:  Hypoxia  EXAM: PORTABLE CHEST - 1 VIEW  COMPARISON:  01/08/2014  FINDINGS: Cardiomediastinal silhouette is stable. Mild elevation of the left hemidiaphragm. There is central mild vascular congestion and mild interstitial prominence suspicious for mild interstitial edema. Small left pleural effusion with left basilar atelectasis or infiltrate. Right IJ sheath is unchanged in position.  IMPRESSION: Mild vascular congestion mild interstitial prominence suspicious for mild interstitial edema. Small left pleural effusion with left basilar atelectasis or infiltrate.   Electronically Signed   By: Natasha MeadLiviu  Pop M.D.   On: 01/09/2014 10:45   Dg Chest Port 1 View  01/08/2014   CLINICAL DATA:  Evaluate  central line placement.  EXAM: PORTABLE CHEST - 1 VIEW  COMPARISON:  01/08/2014  FINDINGS: There is a right IJ catheter with tip in the projection of the SVC. No pneumothorax identified. Mild cardiac enlargement. There is calcified plaque identified within the aortic arch. Pulmonary vascular congestion is noted. There is bibasilar atelectasis.  IMPRESSION: 1. Tip of the IJ catheter is in the SVC.  No pneumothorax. 2. Cardiac enlargement, atherosclerotic disease and pulmonary vascular congestion.   Electronically Signed   By: Signa Kellaylor  Stroud M.D.   On: 01/08/2014 23:52   Dg Abd Portable 1v  01/13/2014   CLINICAL DATA:  Generalized abdominal pain, nausea.  EXAM: PORTABLE ABDOMEN - 1 VIEW  COMPARISON:  January 08, 2014.  FINDINGS: The bowel gas pattern is normal. No radio-opaque calculi or other significant radiographic abnormality are seen.  IMPRESSION: No evidence of bowel obstruction or ileus.   Electronically Signed   By: Roque LiasJames  Green M.D.   On: 01/13/2014 18:18    CBC  Recent Labs Lab 01/14/14 0430 01/17/14 0530 01/20/14 0552  WBC 11.1* 8.1 8.8  HGB 9.9* 8.9* 9.6*  HCT 31.4* 29.2* 31.1*  PLT 269 214 198  MCV 90.8 93.0 95.1  MCH 28.6 28.3 29.4  MCHC 31.5 30.5 30.9  RDW 15.1 14.7 14.8    Chemistries   Recent Labs Lab 01/16/14 0845 01/17/14 0530 01/18/14 0519 01/19/14 0714 01/20/14 0552  NA 147 149* 146 146 145  K 4.9 4.7 4.5 4.1 3.7  CL 112 112 109 108 108  CO2 24 21 21 23 21   GLUCOSE 94 102* 96 90 104*  BUN 46* 49* 52* 53* 56*  CREATININE 8.18* 8.09* 7.97* 7.80* 7.35*  CALCIUM 8.7 8.9 9.0 9.5 9.6   ------------------------------------------------------------------------------------------------------------------ estimated creatinine clearance is 7.5 mL/min (by C-G formula based on Cr of 7.35). ------------------------------------------------------------------------------------------------------------------ No results for input(s): HGBA1C in the last 72  hours. ------------------------------------------------------------------------------------------------------------------ No results for input(s): CHOL, HDL, LDLCALC, TRIG, CHOLHDL, LDLDIRECT in the last 72 hours. ------------------------------------------------------------------------------------------------------------------ No results for input(s): TSH, T4TOTAL, T3FREE, THYROIDAB in the last 72 hours.  Invalid input(s): FREET3 ------------------------------------------------------------------------------------------------------------------ No results for input(s): VITAMINB12, FOLATE, FERRITIN, TIBC, IRON, RETICCTPCT in the last 72 hours.  Coagulation profile No results for input(s): INR, PROTIME in the last 168 hours.  No results for input(s): DDIMER in the last 72 hours.  Cardiac Enzymes No results for input(s): CKMB, TROPONINI, MYOGLOBIN in the last 168 hours.  Invalid input(s): CK ------------------------------------------------------------------------------------------------------------------ Invalid input(s): POCBNP    Madai Nuccio D.O. on 01/20/2014 at 10:19 AM  Between 7am to 7pm - Pager - [phone]  After 7pm go to [email or web address] - password [email or web address]  And look for the night coverage person covering for me after hours  [facility] Office  [phone]

## 2014-01-20 NOTE — Progress Notes (Signed)
S: Cont to eat better O:BP 112/66 mmHg  Pulse 42  Temp(Src) 99.2 F (37.3 C) (Oral)  Resp 18  Ht 5\' 8"  (1.727 m)  Wt 88.769 kg (195 lb 11.2 oz)  BMI 29.76 kg/m2  SpO2 98%  Intake/Output Summary (Last 24 hours) at 01/20/14 0908 Last data filed at 01/20/14 0700  Gross per 24 hour  Intake    610 ml  Output   1555 ml  Net   -945 ml   Weight change: -3.039 kg (-6 lb 11.2 oz) Gen: Awake and alert CVS: Tachy, reg (pulse not 42 as noted above) Resp: Decreased BS throughout Abd: + BS NTND Ext: no edema NEURO: CNI Ox3   . antiseptic oral rinse  7 mL Mouth Rinse BID  . aspirin EC  81 mg Oral Daily  . feeding supplement (NEPRO CARB STEADY)  237 mL Oral BID BM  . folic acid  1 mg Oral Daily  . heparin  5,000 Units Subcutaneous 3 times per day  . latanoprost  1 drop Both Eyes QHS  . levothyroxine  25 mcg Oral QAC breakfast  . multivitamin with minerals  1 tablet Oral Daily  . pantoprazole  40 mg Oral BID  . pilocarpine  1 drop Right Eye QID  . senna-docusate  1 tablet Oral BID  . thiamine  100 mg Oral Daily   No results found. BMET    Component Value Date/Time   NA 145 01/20/2014 0552   K 3.7 01/20/2014 0552   CL 108 01/20/2014 0552   CO2 21 01/20/2014 0552   GLUCOSE 104* 01/20/2014 0552   BUN 56* 01/20/2014 0552   CREATININE 7.35* 01/20/2014 0552   CALCIUM 9.6 01/20/2014 0552   GFRNONAA 5* 01/20/2014 0552   GFRAA 6* 01/20/2014 0552   CBC    Component Value Date/Time   WBC 8.8 01/20/2014 0552   RBC 3.27* 01/20/2014 0552   HGB 9.6* 01/20/2014 0552   HCT 31.1* 01/20/2014 0552   PLT 198 01/20/2014 0552   MCV 95.1 01/20/2014 0552   MCH 29.4 01/20/2014 0552   MCHC 30.9 01/20/2014 0552   RDW 14.8 01/20/2014 0552   LYMPHSABS 1.3 01/11/2014 0535   MONOABS 1.1* 01/11/2014 0535   EOSABS 1.1* 01/11/2014 0535   BASOSABS 0.1 01/11/2014 0535     Assessment: 1.  Presumably ARF with good UO.  (Renal fx nl with Scr 0.9 last May.)  Scr consistently decreasing 2.  COPD on  home O2 3. Hx HTN  Plan: 1.  Recheck labs in AM 2.  Note plans for SNF.  If Scr even lower tomorrow then will pull HD cath and she could go to SNF will FU labs to be done there  Garlan Drewes T

## 2014-01-21 LAB — BASIC METABOLIC PANEL
Anion gap: 14 (ref 5–15)
BUN: 60 mg/dL — ABNORMAL HIGH (ref 6–23)
CALCIUM: 9.8 mg/dL (ref 8.4–10.5)
CO2: 24 mEq/L (ref 19–32)
Chloride: 110 mEq/L (ref 96–112)
Creatinine, Ser: 6.85 mg/dL — ABNORMAL HIGH (ref 0.50–1.10)
GFR calc Af Amer: 6 mL/min — ABNORMAL LOW (ref >90)
GFR, EST NON AFRICAN AMERICAN: 5 mL/min — AB (ref >90)
GLUCOSE: 104 mg/dL — AB (ref 70–99)
POTASSIUM: 3.8 meq/L (ref 3.7–5.3)
SODIUM: 148 meq/L — AB (ref 137–147)

## 2014-01-21 LAB — GLUCOSE, CAPILLARY: GLUCOSE-CAPILLARY: 94 mg/dL (ref 70–99)

## 2014-01-21 MED ORDER — THIAMINE HCL 100 MG PO TABS: 100.0000 mg | ORAL_TABLET | Freq: Every day | ORAL | Status: AC

## 2014-01-21 MED ORDER — FOLIC ACID 1 MG PO TABS: 1.0000 mg | ORAL_TABLET | Freq: Every day | ORAL | Status: AC

## 2014-01-21 MED ORDER — SENNOSIDES-DOCUSATE SODIUM 8.6-50 MG PO TABS: 1.0000 | ORAL_TABLET | Freq: Two times a day (BID) | ORAL | Status: AC

## 2014-01-21 MED ORDER — NEPRO/CARBSTEADY PO LIQD: 237.0000 mL | ORAL | Status: DC | PRN

## 2014-01-21 MED ORDER — NEPRO/CARBSTEADY PO LIQD: 237.0000 mL | Freq: Two times a day (BID) | ORAL | Status: DC

## 2014-01-21 NOTE — Progress Notes (Signed)
PT Cancellation Note  Patient Details Name: [name] MRN: [id number] DOB: 08/01/1934   Cancelled Treatment:    Reason Eval/Treat Not Completed: Per CSW, pt to d/c to SNF this afternoon. Will defer any further PT to next venue of care. If d/c is delayed and pt does not leave today, will see tomorrow as schedule allows.    Conni SlipperKirkman, Jareli Highland 01/21/2014, 1:58 PM   Conni SlipperLaura Ronson Hagins, PT, DPT Acute Rehabilitation Services Pager: [phone]

## 2014-01-21 NOTE — Clinical Social Work Placement (Addendum)
Clinical Social Work Department CLINICAL SOCIAL WORK PLACEMENT NOTE 01/21/2014  Patient:  [name],[name]  Account Number:  [id number] Admit date:  01/08/2014  Clinical Social Worker:  Genelle BalVANESSA Jestin Burbach, LCSW  Date/time:  01/21/2014 11:01 AM  Clinical Social Work is seeking post-discharge placement for this patient at the following level of care:   SKILLED NURSING   (*CSW will update this form in Epic as items are completed)   01/17/2014  Patient/family provided with [facility] Department of Clinical Social Work's list of facilities offering this level of care within the geographic area requested by the patient (or if unable, by the patient's family).  01/17/2014  Patient/family informed of their freedom to choose among providers that offer the needed level of care, that participate in Medicare, Medicaid or managed care program needed by the patient, have an available bed and are willing to accept the patient.    Patient/family informed of [facility]' ownership interest in [facility], as well as of the fact that they are under no obligation to receive care at this facility.  PASARR submitted to EDS on 01/21/14 PASARR number received on 01/21/14  FL2 transmitted to all facilities in geographic area requested by pt/family on  01/17/2014 FL2 transmitted to all facilities within larger geographic area on   Patient informed that his/her managed care company has contracts with or will negotiate with  certain facilities, including the following:     Patient/family informed of bed offers received:  01/18/2014 [facility] Physician recommends and patient chooses bed at    Patient to be transferred to [facility] on 01/21/14   Patient to be transferred to facility by daughter [name] Chimennette Florentino  Patient and family notified of transfer on 01/21/14, daughter informed CSW that she will transport patient to SNF Name of family member notified: [name],  daughter   The following physician request were entered in Epic:   Additional Comments:

## 2014-01-21 NOTE — Clinical Social Work Psychosocial (Signed)
Clinical Social Work Department BRIEF PSYCHOSOCIAL ASSESSMENT 01/21/2014  Patient:  [name],[name]     Account Number:  [id number]     Admit date:  01/08/2014  Clinical Social Worker:  Delmer IslamRAWFORD,Dorina Ribaudo, LCSW  Date/Time:  01/21/2014 10:50 AM  Referred by:  Physician  Date Referred:  01/16/2014 Referred for  SNF Placement   Other Referral:   Interview type:  Family Other interview type:    PSYCHOSOCIAL DATA Living Status:   Admitted from facility:   Level of care:   Primary support name:  Dwyane DeeKeith Strong Primary support relationship to patient:  CHILD, ADULT Degree of support available:   Good support from her brother, per daughter, [name], [place], [place].    CURRENT CONCERNS Current Concerns  Post-Acute Placement   Other Concerns:    SOCIAL WORK ASSESSMENT / PLAN On 01/17/14 CSW talked with daughter [name] at the bedside. Patient was awake, but did not participate in conversation. She came from [place] be with mother while hospitalized. Per daughter, patient lives with her brothers [name] and [name]. [name] is the primary caregiver to patient per daughter. Daughter reporter that patient has also been to [place] stay with her for a period of time, then [facility]. CSW talked with daughter about recommendation of ST rehab and she is in agreement and is interested in her mother going to [facility] for short-term rehab. CSW informed [name] that she will be informed about facility responses.   Assessment/plan status:   Other assessment/ plan:   Information/referral to community resources:   [facility]    PATIENT'S/FAMILY'S RESPONSE TO PLAN OF CARE: Daughter receptive to talking with CSW and in agreement with ST rehab. [name] indicated that she will talk with her brothers.

## 2014-01-21 NOTE — Plan of Care (Signed)
Problem: Phase I Progression Outcomes Goal: Hemodynamically stable Outcome: Completed/Met Date Met:  01/21/14 Goal: Pt./family verbalizes plan of care Outcome: Completed/Met Date Met:  01/21/14 Goal: Other Phase I Outcomes/Goals Outcome: Not Applicable Date Met:  38/87/19  Problem: Phase II Progression Outcomes Goal: Dyspnea controlled at rest Outcome: Completed/Met Date Met:  01/21/14 Goal: Tol increased activity, up in chair for at least 4 hrs/HD pt Outcome: Progressing Goal: Discharge plan established Outcome: Completed/Met Date Met:  01/21/14 Goal: Tolerating diet Outcome: Completed/Met Date Met:  01/21/14 Goal: Hemodynamically stable Outcome: Completed/Met Date Met:  01/21/14

## 2014-01-21 NOTE — Discharge Instructions (Signed)
Acute Kidney Injury Acute kidney injury is a disease in which there is sudden (acute) damage to the kidneys. The kidneys are 2 organs that lie on either side of the spine between the middle of the back and the front of the abdomen. The kidneys:  Remove wastes and extra water from the blood.   Produce important hormones. These help keep bones strong, regulate blood pressure, and help create red blood cells.   Balance the fluids and chemicals in the blood and tissues. A small amount of kidney damage may not cause problems, but a large amount of damage may make it difficult or impossible for the kidneys to work the way they should. Acute kidney injury may develop into long-lasting (chronic) kidney disease. It may also develop into a life-threatening disease called end-stage kidney disease. Acute kidney injury can get worse very quickly, so it should be treated right away. Early treatment may prevent other kidney diseases from developing.  CAUSES   A problem with blood flow to the kidneys. This may be caused by:   Blood loss.   Heart disease.   Severe burns.   Liver disease.  Direct damage to the kidneys. This may be caused by:  Some medicines.   A kidney infection.   Poisoning or consuming toxic substances.   A surgical wound.   A blow to the kidney area.   A problem with urine flow. This may be caused by:   Cancer.   Kidney stones.   An enlarged prostate. SYMPTOMS   Swelling (edema) of the legs, ankles, or feet.   Tiredness (lethargy).   Nausea or vomiting.   Confusion.   Problems with urination, such as:   Painful or burning feeling during urination.   Decreased urine production.   Frequent accidents in children who are potty trained.   Bloody urine.   Muscle twitches and cramps.   Shortness of breath.   Seizures.   Chest pain or pressure. Sometimes, no symptoms are present. DIAGNOSIS Acute kidney injury may be detected  and diagnosed by tests, including blood, urine, imaging, or kidney biopsy tests.  TREATMENT Treatment of acute kidney injury varies depending on the cause and severity of the kidney damage. In mild cases, no treatment may be needed. The kidneys may heal on their own. If acute kidney injury is more severe, your caregiver will treat the cause of the kidney damage, help the kidneys heal, and prevent complications from occurring. Severe cases may require a procedure to remove toxic wastes from the body (dialysis) or surgery to repair kidney damage. Surgery may involve:   Repair of a torn kidney.   Removal of an obstruction. Most of the time, you will need to stay overnight at the hospital.  HOME CARE INSTRUCTIONS:  Follow your prescribed diet.  Only take over-the-counter or prescription medicines as directed by your caregiver.  Do not take any new medicines (prescription, over-the-counter, or nutritional supplements) unless approved by your caregiver. Many medicines can worsen your kidney damage or need to have the dose adjusted.   Keep all follow-up appointments as directed by your caregiver.  Observe your condition to make sure you are healing as expected. SEEK IMMEDIATE MEDICAL CARE IF:  You are feeling ill or have severe pain in the back or side.   Your symptoms return or you have new symptoms.  You have any symptoms of end-stage kidney disease. These include:   Persistent itchiness.   Loss of appetite.   Headaches.   Abnormally dark   or light skin.  Numbness in the hands or feet.   Easy bruising.   Frequent hiccups.   Menstruation stops.   You have a fever.  You have increased urine production.  You have pain or bleeding when urinating. MAKE SURE YOU:   Understand these instructions.  Will watch your condition.  Will get help right away if you are not doing well or get worse Document Released: 08/17/2010 Document Revised: 05/29/2012 Document  Reviewed: 10/01/2011 Exit[employer] Patient Information 2015 [employer]. This information is not intended to replace advice given to you by your health care provider. Make sure you discuss any questions you have with your health care provider.  

## 2014-01-21 NOTE — Plan of Care (Signed)
Problem: Phase I Progression Outcomes Goal: Uremic symptoms managed Outcome: Completed/Met Date Met:  01/21/14

## 2014-01-21 NOTE — Discharge Summary (Signed)
Physician Discharge Summary  [name] UJW:[id number] DOB: 1934/12/20 DOA: 01/08/2014  PCP: No primary care provider on file.  Admit date: 01/08/2014 Discharge date: 01/21/2014  Time spent: 45 minutes  Recommendations for Outpatient Follow-up:  [facility] She will need continued physical and occupational therapy as recommended by the facility. Patient to continue her medications as prescribed. Patient will need to follow with nephrology, Dr. Marisue HumbleSanford; his office will call to set up appointment. Patient will need to have her creatinine monitored within every 3-4 days, please send results to [facility]. Patient should also follow-up with her primary care physician. Patient may resume a renal diet with 1200 mL fluid restriction daily.  Discharge Diagnoses:  Acute renal failure/question were ATN/hyperkalemia Acute respiratory failure with hypoxia/pulmonary edema Left upper extremity DVT Hypertensionsand Acute simple Diarrhea/constipation COPD/cough Hypertension and hypothyroidism GERD Deconditioning  Discharge Condition: Stable  Diet recommendation: Renal with 1200 mL with restriction per day  [facility]   01/18/14 2130 01/19/14 2117 01/20/14 2204  Weight: 91.808 kg (202 lb 6.4 oz) 88.769 kg (195 lb 11.2 oz) 87.1 kg (192 lb 0.3 oz)    History of present illness:  on 01/08/2014 by Dr. Freda MunroSaadat Khan  [name] Larsenellie Bryngelson is a 79 y.o. female presents as a transfer from [facility]. Not a reliable historian and no family is present. Patient apparently has a past history of COPD presented to the ED with complaints of diarrhea. She states that this has been going on for a while. She states that she has had some abdominal pain associated. She also notes decreased urine output. She admits to having shortness of breath, She has no prior history of kidney disease. At [place] she was noted to have hyperkalemia with a potassium of 9 this was  aggressively treated and came down minimally to a final reading of 8 prior to transfer. Patient also was noted to have a creatinine of 14 and therefore she [facility].  Interim history Nephrology was consulted and determined patient needed urgent hemodialysis. Temporary dialysis catheter was inserted by PCCM and patient underwent hemodialysis. Her potassium was corrected with dialysis, her creatinine also decreased to 6. Postdialysis, patient had issues with hypotension and required fluid challenges. On 11:30 patient's creatinine began to increase again, patient did have adequate urine output and continues to do so. She continued to have symptoms of encephalopathy which is likely secondary to her uremia. Nephrology is hopeful the patient's renal function will recover and patient will not R lifelong dialysis  Hospital Course:  Acute renal failure/questionable ATN/hyperkalemia -Unknown etiology, possibly dehydration, diarrhea, diuretic use -Baseline creatinine per daughter in May 2015 0.9 with a BUN of 17 -Hyperkalemia has resolved will continue to monitor BMP -Renal ultrasound showed mild medical renal disease -Creatinine today 6.85  (trending downward) -Nephrology consulted and appreciated, continue to trend and follow Cr -Patient continue to have good urine output  Acute respiratory failure with hypoxia/pulmonary edema -Improved -Currently on 2 L oxygen -Per daughter at bedside, patient does require oxygen at home with ambulation  Left upper extremity DVT -No DVT noted on Doppler -Continue conservative management, keep arm elevated  Hypotension -Resolved, no evidence of sepsis or infection -Echocardiogram showed no significant pericardial effusion  Acute encephalopathy -Resolved, per daughter patient is at her baseline. -Likely secondary to acute renal failure with uremia  Diarrhea/constipation -Patient had diarrhea before admission however now  appears to be constipated -C. difficile PCR negative -KUB on 01/13/2014 showed no  evidence of bowel obstruction or ileus -Continue bowel regimen  COPD/cough -Appears stable  Hypertension -Patient was on Tenormin and Lasix at home, which have been held during her hospitalization  Hypothyroidism -TSH 1.8 -Continue Synthroid  GERD -Continue Protonix  Deconditioning -PT consulted and recommended nursing home placement  Procedures: Insertion of hemodialysis catheter 01/08/2014 Echocardiogram on 01/09/2014: EF 55%, grade 1 diastolic dysfunction  Consultations: PCCM Nephrology  Discharge Exam: Filed Vitals:   01/20/14 2204  BP: 125/62  Pulse: 112  Temp: 98.6 F (37 C)  Resp: 18     General: Well developed, well nourished, NAD, appears stated age  HEENT: NCAT, mucous membranes moist.  Cardiovascular: S1 S2 auscultated, Regular rate and rhythm.  Respiratory: Clear to auscultation bilaterally with equal chest rise  Abdomen: Soft, nontender, nondistended, + bowel sounds  Extremities: warm dry without cyanosis clubbing or edema  Neuro: AAOx3, no focal deficits  Psych: Appropriate mood and affect  Discharge Instructions      Discharge Instructions    Discharge instructions    Complete by:  As directed   [facility] She will need continued physical and occupational therapy as recommended by the facility. Patient to continue her medications as prescribed. Patient will need to follow with nephrology, Dr. Marisue Humble; his office will call to set up appointment. Patient will need to have her creatinine monitored within every 3-4 days, please send results to [facility]. Patient should also follow-up with her primary care physician. Patient may resume a renal diet with 1200 mL fluid restriction daily.            Medication List    STOP taking these medications        atenolol 50 MG tablet  Commonly known as:  TENORMIN      diltiazem 30 MG tablet  Commonly known as:  CARDIZEM     furosemide 20 MG tablet  Commonly known as:  LASIX      TAKE these medications        ADVAIR DISKUS 250-50 MCG/DOSE Aepb  Generic drug:  Fluticasone-Salmeterol  Inhale 1 puff into the lungs 2 (two) times daily.     ascorbic acid 1000 MG tablet  Commonly known as:  VITAMIN C  Take 1,000 mg by mouth 3 (three) times daily.     aspirin EC 81 MG tablet  Take 81 mg by mouth daily.     azelastine 0.1 % nasal spray  Commonly known as:  ASTELIN  Place 1 spray into both nostrils daily. Use in each nostril as directed     Brinzolamide-Brimonidine 1-0.2 % Susp  Place 1 drop into both eyes 2 (two) times daily.     cholecalciferol 1000 UNITS tablet  Commonly known as:  VITAMIN D  Take 1,000 Units by mouth daily.     feeding supplement (NEPRO CARB STEADY) Liqd  Take 237 mLs by mouth as needed (missed meal during dialysis.).     feeding supplement (NEPRO CARB STEADY) Liqd  Take 237 mLs by mouth 2 (two) times daily between meals.     Fish Oil 1000 MG Caps  Take 1 capsule by mouth daily.     folic acid 1 MG tablet  Commonly known as:  FOLVITE  Take 1 tablet (1 mg total) by mouth daily.     Garlic 1000 MG Caps  Take 1 capsule by mouth daily.     latanoprost 0.005 % ophthalmic solution  Commonly known as:  Energy manager  1 drop into both eyes at bedtime.     levothyroxine 25 MCG tablet  Commonly known as:  SYNTHROID, LEVOTHROID  Take 25 mcg by mouth daily before breakfast.     loratadine 10 MG tablet  Commonly known as:  CLARITIN  Take 10 mg by mouth daily.     LUTEIN-ZEAXANTHIN PO  Take 1 tablet by mouth daily.     multivitamin with minerals tablet  Take 1 tablet by mouth daily.     omeprazole 40 MG capsule  Commonly known as:  PRILOSEC  Take 40 mg by mouth daily.     pilocarpine 1 % ophthalmic solution  Commonly known as:  PILOCAR  Place 1 drop into the right eye 4 (four) times daily.     PROAIR HFA  108 (90 BASE) MCG/ACT inhaler  Generic drug:  albuterol  Inhale 2 puffs into the lungs every 6 (six) hours as needed for wheezing or shortness of breath.     senna-docusate 8.6-50 MG per tablet  Commonly known as:  Senokot-S  Take 1 tablet by mouth 2 (two) times daily.     thiamine 100 MG tablet  Take 1 tablet (100 mg total) by mouth daily.       Allergies  Allergen Reactions  . Ciprofloxacin Nausea And Vomiting   Follow-up Information    Follow up with Jearld LeschWILLIAMS,DWIGHT M, MD. Schedule an appointment as soon as possible for a visit in 1 week.   Specialty:  Specialist   Why:  Hospital followup   Contact information:   [address] [place] [address] [phone]       Follow up with HODGES,FRANCISCO, MD.   Specialty:  Family Medicine   Why:  At the nursing home      Follow up with [name], [name], B, MD.   Specialty:  Nephrology   Contact information:   [address] [place] [address] [phone]        The results of significant diagnostics from this hospitalization (including imaging, microbiology, ancillary and laboratory) are listed below for reference.    Significant Diagnostic Studies: [place] Renal  01/08/2014   CLINICAL DATA:  Acute renal failure  EXAM: RENAL/URINARY TRACT ULTRASOUND COMPLETE  COMPARISON:  None.  FINDINGS: Right Kidney:  Length: 15 cm. Increased renal cortical echogenicity. No mass or hydronephrosis visualized.  Left Kidney:  Length: 12.3 cm. Increased renal cortical echogenicity. No mass or hydronephrosis visualized.  Bladder:  Appears normal for degree of bladder distention.  IMPRESSION: 1. No obstructive uropathy. 2. Increased renal cortical echogenicity as can be seen with medical renal disease.   Electronically Signed   By: Elige KoHetal  Patel   On: 01/08/2014 21:15   Dg Chest Port 1 View  01/09/2014   CLINICAL DATA:  Hypoxia  EXAM: PORTABLE CHEST - 1 VIEW  COMPARISON:  01/08/2014  FINDINGS: Cardiomediastinal silhouette is stable. Mild elevation of  the left hemidiaphragm. There is central mild vascular congestion and mild interstitial prominence suspicious for mild interstitial edema. Small left pleural effusion with left basilar atelectasis or infiltrate. Right IJ sheath is unchanged in position.  IMPRESSION: Mild vascular congestion mild interstitial prominence suspicious for mild interstitial edema. Small left pleural effusion with left basilar atelectasis or infiltrate.   Electronically Signed   By: Natasha MeadLiviu  Pop M.D.   On: 01/09/2014 10:45   Dg Chest Port 1 View  01/08/2014   CLINICAL DATA:  Evaluate central line placement.  EXAM: PORTABLE CHEST - 1 VIEW  COMPARISON:  01/08/2014  FINDINGS:  There is a right IJ catheter with tip in the projection of the SVC. No pneumothorax identified. Mild cardiac enlargement. There is calcified plaque identified within the aortic arch. Pulmonary vascular congestion is noted. There is bibasilar atelectasis.  IMPRESSION: 1. Tip of the IJ catheter is in the SVC.  No pneumothorax. 2. Cardiac enlargement, atherosclerotic disease and pulmonary vascular congestion.   Electronically Signed   By: Signa Kell M.D.   On: 01/08/2014 23:52   Dg Abd Portable 1v  01/13/2014   CLINICAL DATA:  Generalized abdominal pain, nausea.  EXAM: PORTABLE ABDOMEN - 1 VIEW  COMPARISON:  January 08, 2014.  FINDINGS: The bowel gas pattern is normal. No radio-opaque calculi or other significant radiographic abnormality are seen.  IMPRESSION: No evidence of bowel obstruction or ileus.   Electronically Signed   By: Roque Lias M.D.   On: 01/13/2014 18:18    Microbiology: No results found for this or any previous visit (from the past 240 hour(s)).   Labs: Basic Metabolic Panel:  Recent Labs Lab 01/15/14 0535 01/16/14 0845 01/17/14 0530 01/18/14 0519 01/19/14 0714 01/20/14 0552 01/21/14 0642  NA 143 147 149* 146 146 145 148*  K 5.1 4.9 4.7 4.5 4.1 3.7 3.8  CL 105 112 112 109 108 108 110  CO2 24 24 21 21 23 21 24   GLUCOSE 91  94 102* 96 90 104* 104*  BUN 41* 46* 49* 52* 53* 56* 60*  CREATININE 7.83* 8.18* 8.09* 7.97* 7.80* 7.35* 6.85*  CALCIUM 8.5 8.7 8.9 9.0 9.5 9.6 9.8  PHOS 6.9* 7.0* 7.6* 7.3* 7.3*  --   --    Liver Function Tests:  Recent Labs Lab 01/15/14 0535 01/16/14 0845 01/17/14 0530 01/18/14 0519 01/19/14 0714  ALBUMIN 1.9* 2.1* 2.1* 2.4* 2.3*   No results for input(s): LIPASE, AMYLASE in the last 168 hours. No results for input(s): AMMONIA in the last 168 hours. CBC:  Recent Labs Lab 01/17/14 0530 01/20/14 0552  WBC 8.1 8.8  HGB 8.9* 9.6*  HCT 29.2* 31.1*  MCV 93.0 95.1  PLT 214 198   Cardiac Enzymes: No results for input(s): CKTOTAL, CKMB, CKMBINDEX, TROPONINI in the last 168 hours. BNP: BNP (last 3 results) No results for input(s): PROBNP in the last 8760 hours. CBG:  Recent Labs Lab 01/15/14 0758 01/17/14 0800  GLUCAP 100* 92    Signed:  Areon Cocuzza  [facility] 01/21/2014, 12:30 PM

## 2014-01-22 NOTE — Care Management Note (Signed)
Late Entry:  CARE MANAGEMENT NOTE 01/22/2014  Patient:  Strong,[name]   Account Number:  [id number]  Date Initiated:  01/11/2014  Documentation initiated by:  MAYO,HENRIETTA  Subjective/Objective Assessment:   dx ARF; lives with son, has home O2     Action/Plan:   01/17/2014 CM continue to follow for d/c needs, acute vs chronic renal failure still not determined.   Anticipated DC Date:  01/21/2014   Anticipated DC Plan:  SKILLED NURSING FACILITY  In-house referral  Clinical Social Worker      DC Planning Services  CM consult      Choice offered to / List presented to:             Status of service:  Completed, signed off Medicare Important Message given?  YES (If response is "NO", the following Medicare IM given date fields will be blank) Date Medicare IM given:  01/11/2014 Medicare IM given by:  MAYO,HENRIETTA Date Additional Medicare IM given:  01/14/2014 Additional Medicare IM given by:  HENRIETTA MAYO  Discharge Disposition:  SKILLED NURSING FACILITY  Per UR Regulation:  Reviewed for med. necessity/level of care/duration of stay  If discussed at Long Length of Stay Meetings, dates discussed:    Comments:  01/21/2014 Pt for D/C to SNF, IM given to pt and daughter. Adv directive and HCPOA info given to daughter per her request.  CRoyal RN MPH, case manager, [phone]

## 2014-01-29 ENCOUNTER — Emergency Department (HOSPITAL_COMMUNITY): Payer: Medicare Other

## 2014-01-29 ENCOUNTER — Inpatient Hospital Stay (HOSPITAL_COMMUNITY)
Admission: EM | Admit: 2014-01-29 | Discharge: 2014-02-13 | DRG: 682 | Disposition: A | Discharge status: Home / Self Care | Payer: Medicare Other | Attending: Internal Medicine | Admitting: Internal Medicine

## 2014-01-29 ENCOUNTER — Inpatient Hospital Stay (HOSPITAL_COMMUNITY): Payer: Medicare Other

## 2014-01-29 ENCOUNTER — Encounter (HOSPITAL_COMMUNITY): Payer: Self-pay | Admitting: Emergency Medicine

## 2014-01-29 DIAGNOSIS — E43 Unspecified severe protein-calorie malnutrition: Secondary | ICD-10-CM | POA: Insufficient documentation

## 2014-01-29 DIAGNOSIS — N170 Acute kidney failure with tubular necrosis: Principal | ICD-10-CM | POA: Diagnosis present

## 2014-01-29 DIAGNOSIS — H409 Unspecified glaucoma: Secondary | ICD-10-CM | POA: Diagnosis present

## 2014-01-29 DIAGNOSIS — R0602 Shortness of breath: Secondary | ICD-10-CM

## 2014-01-29 DIAGNOSIS — R000 Tachycardia, unspecified: Secondary | ICD-10-CM | POA: Diagnosis present

## 2014-01-29 DIAGNOSIS — Z992 Dependence on renal dialysis: Secondary | ICD-10-CM

## 2014-01-29 DIAGNOSIS — R1013 Epigastric pain: Secondary | ICD-10-CM | POA: Diagnosis present

## 2014-01-29 DIAGNOSIS — R627 Adult failure to thrive: Secondary | ICD-10-CM | POA: Diagnosis present

## 2014-01-29 DIAGNOSIS — Z87891 Personal history of nicotine dependence: Secondary | ICD-10-CM | POA: Diagnosis not present

## 2014-01-29 DIAGNOSIS — I10 Essential (primary) hypertension: Secondary | ICD-10-CM | POA: Diagnosis present

## 2014-01-29 DIAGNOSIS — Z6827 Body mass index (BMI) 27.0-27.9, adult: Secondary | ICD-10-CM | POA: Diagnosis not present

## 2014-01-29 DIAGNOSIS — D721 Eosinophilia: Secondary | ICD-10-CM | POA: Diagnosis present

## 2014-01-29 DIAGNOSIS — J449 Chronic obstructive pulmonary disease, unspecified: Secondary | ICD-10-CM | POA: Diagnosis present

## 2014-01-29 DIAGNOSIS — D649 Anemia, unspecified: Secondary | ICD-10-CM | POA: Diagnosis present

## 2014-01-29 DIAGNOSIS — I120 Hypertensive chronic kidney disease with stage 5 chronic kidney disease or end stage renal disease: Secondary | ICD-10-CM | POA: Diagnosis present

## 2014-01-29 DIAGNOSIS — K219 Gastro-esophageal reflux disease without esophagitis: Secondary | ICD-10-CM | POA: Diagnosis present

## 2014-01-29 DIAGNOSIS — R531 Weakness: Secondary | ICD-10-CM

## 2014-01-29 DIAGNOSIS — Z7982 Long term (current) use of aspirin: Secondary | ICD-10-CM | POA: Diagnosis not present

## 2014-01-29 DIAGNOSIS — I959 Hypotension, unspecified: Secondary | ICD-10-CM | POA: Insufficient documentation

## 2014-01-29 DIAGNOSIS — N179 Acute kidney failure, unspecified: Secondary | ICD-10-CM | POA: Diagnosis present

## 2014-01-29 DIAGNOSIS — E039 Hypothyroidism, unspecified: Secondary | ICD-10-CM | POA: Diagnosis present

## 2014-01-29 DIAGNOSIS — N186 End stage renal disease: Secondary | ICD-10-CM | POA: Diagnosis present

## 2014-01-29 DIAGNOSIS — E860 Dehydration: Secondary | ICD-10-CM | POA: Diagnosis present

## 2014-01-29 DIAGNOSIS — E038 Other specified hypothyroidism: Secondary | ICD-10-CM | POA: Diagnosis present

## 2014-01-29 DIAGNOSIS — Z95828 Presence of other vascular implants and grafts: Secondary | ICD-10-CM

## 2014-01-29 DIAGNOSIS — J302 Other seasonal allergic rhinitis: Secondary | ICD-10-CM | POA: Diagnosis present

## 2014-01-29 DIAGNOSIS — E872 Acidosis: Secondary | ICD-10-CM | POA: Diagnosis present

## 2014-01-29 DIAGNOSIS — Z419 Encounter for procedure for purposes other than remedying health state, unspecified: Secondary | ICD-10-CM

## 2014-01-29 DIAGNOSIS — Z9981 Dependence on supplemental oxygen: Secondary | ICD-10-CM

## 2014-01-29 DIAGNOSIS — R109 Unspecified abdominal pain: Secondary | ICD-10-CM

## 2014-01-29 HISTORY — DX: Disorder of kidney and ureter, unspecified: N28.9

## 2014-01-29 LAB — URINALYSIS, ROUTINE W REFLEX MICROSCOPIC
BILIRUBIN URINE: NEGATIVE
Glucose, UA: NEGATIVE mg/dL
Ketones, ur: NEGATIVE mg/dL
NITRITE: NEGATIVE
Protein, ur: 100 mg/dL — AB
SPECIFIC GRAVITY, URINE: 1.013 (ref 1.005–1.030)
Urobilinogen, UA: 0.2 mg/dL (ref 0.0–1.0)
pH: 6.5 (ref 5.0–8.0)

## 2014-01-29 LAB — CBC
HEMATOCRIT: 29.6 % — AB (ref 36.0–46.0)
Hemoglobin: 9.3 g/dL — ABNORMAL LOW (ref 12.0–15.0)
MCH: 28.5 pg (ref 26.0–34.0)
MCHC: 31.4 g/dL (ref 30.0–36.0)
MCV: 90.8 fL (ref 78.0–100.0)
Platelets: 303 10*3/uL (ref 150–400)
RBC: 3.26 MIL/uL — AB (ref 3.87–5.11)
RDW: 15.7 % — ABNORMAL HIGH (ref 11.5–15.5)
WBC: 9.5 10*3/uL (ref 4.0–10.5)

## 2014-01-29 LAB — CBC WITH DIFFERENTIAL/PLATELET
Basophils Absolute: 0.2 10*3/uL — ABNORMAL HIGH (ref 0.0–0.1)
Basophils Relative: 1 % (ref 0–1)
Eosinophils Absolute: 0.9 10*3/uL — ABNORMAL HIGH (ref 0.0–0.7)
Eosinophils Relative: 8 % — ABNORMAL HIGH (ref 0–5)
HCT: 34.4 % — ABNORMAL LOW (ref 36.0–46.0)
Hemoglobin: 11 g/dL — ABNORMAL LOW (ref 12.0–15.0)
LYMPHS PCT: 21 % (ref 12–46)
Lymphs Abs: 2.4 10*3/uL (ref 0.7–4.0)
MCH: 29 pg (ref 26.0–34.0)
MCHC: 32 g/dL (ref 30.0–36.0)
MCV: 90.8 fL (ref 78.0–100.0)
Monocytes Absolute: 1.1 10*3/uL — ABNORMAL HIGH (ref 0.1–1.0)
Monocytes Relative: 10 % (ref 3–12)
NEUTROS ABS: 6.9 10*3/uL (ref 1.7–7.7)
Neutrophils Relative %: 60 % (ref 43–77)
PLATELETS: 303 10*3/uL (ref 150–400)
RBC: 3.79 MIL/uL — AB (ref 3.87–5.11)
RDW: 15.8 % — ABNORMAL HIGH (ref 11.5–15.5)
WBC: 11.5 10*3/uL — AB (ref 4.0–10.5)

## 2014-01-29 LAB — CREATININE, SERUM
Creatinine, Ser: 8.08 mg/dL — ABNORMAL HIGH (ref 0.50–1.10)
GFR, EST AFRICAN AMERICAN: 5 mL/min — AB (ref >90)
GFR, EST NON AFRICAN AMERICAN: 4 mL/min — AB (ref >90)

## 2014-01-29 LAB — COMPREHENSIVE METABOLIC PANEL
ALBUMIN: 3 g/dL — AB (ref 3.5–5.2)
ALT: 9 U/L (ref 0–35)
ANION GAP: 19 — AB (ref 5–15)
AST: 9 U/L (ref 0–37)
Alkaline Phosphatase: 87 U/L (ref 39–117)
BILIRUBIN TOTAL: 0.3 mg/dL (ref 0.3–1.2)
BUN: 61 mg/dL — AB (ref 6–23)
CALCIUM: 9.5 mg/dL (ref 8.4–10.5)
CHLORIDE: 104 meq/L (ref 96–112)
CO2: 17 mEq/L — ABNORMAL LOW (ref 19–32)
CREATININE: 7.94 mg/dL — AB (ref 0.50–1.10)
GFR calc Af Amer: 5 mL/min — ABNORMAL LOW (ref >90)
GFR, EST NON AFRICAN AMERICAN: 4 mL/min — AB (ref >90)
Glucose, Bld: 123 mg/dL — ABNORMAL HIGH (ref 70–99)
Potassium: 3.8 mEq/L (ref 3.7–5.3)
Sodium: 140 mEq/L (ref 137–147)
Total Protein: 9.3 g/dL — ABNORMAL HIGH (ref 6.0–8.3)

## 2014-01-29 LAB — I-STAT CHEM 8, ED
BUN: 60 mg/dL — AB (ref 6–23)
CHLORIDE: 111 meq/L (ref 96–112)
Calcium, Ion: 1.24 mmol/L (ref 1.13–1.30)
Creatinine, Ser: 9.1 mg/dL — ABNORMAL HIGH (ref 0.50–1.10)
Glucose, Bld: 130 mg/dL — ABNORMAL HIGH (ref 70–99)
HCT: 37 % (ref 36.0–46.0)
Hemoglobin: 12.6 g/dL (ref 12.0–15.0)
POTASSIUM: 3.6 meq/L — AB (ref 3.7–5.3)
SODIUM: 142 meq/L (ref 137–147)
TCO2: 19 mmol/L (ref 0–100)

## 2014-01-29 LAB — URINE MICROSCOPIC-ADD ON

## 2014-01-29 LAB — LIPASE, BLOOD: LIPASE: 42 U/L (ref 11–59)

## 2014-01-29 LAB — I-STAT TROPONIN, ED: TROPONIN I, POC: 0.01 ng/mL (ref 0.00–0.08)

## 2014-01-29 MED ORDER — LATANOPROST 0.005 % OP SOLN
1.0000 [drp] | Freq: Every day | OPHTHALMIC | Status: DC
  Administered 2014-01-29 – 2014-02-12 (×15): 1 [drp] via OPHTHALMIC
  Filled 2014-01-29 (×2): qty 2.5

## 2014-01-29 MED ORDER — ONDANSETRON HCL 4 MG/2ML IJ SOLN
4.0000 mg | Freq: Four times a day (QID) | INTRAMUSCULAR | Status: DC | PRN
Start: 2014-01-29 — End: 2014-02-13
  Administered 2014-01-31 – 2014-02-04 (×2): 4 mg via INTRAVENOUS
  Filled 2014-01-29 (×3): qty 2

## 2014-01-29 MED ORDER — ALBUTEROL SULFATE (2.5 MG/3ML) 0.083% IN NEBU
2.5000 mg | INHALATION_SOLUTION | RESPIRATORY_TRACT | Status: DC | PRN
Start: 2014-01-29 — End: 2014-02-13

## 2014-01-29 MED ORDER — SODIUM CHLORIDE 0.9 % IV SOLN: 250.0000 mL | INTRAVENOUS | Status: DC | PRN

## 2014-01-29 MED ORDER — HEPARIN SODIUM (PORCINE) 5000 UNIT/ML IJ SOLN
5000.0000 [IU] | Freq: Three times a day (TID) | INTRAMUSCULAR | Status: DC
  Administered 2014-01-29 – 2014-01-30 (×3): 5000 [IU] via SUBCUTANEOUS
  Filled 2014-01-29 (×4): qty 1

## 2014-01-29 MED ORDER — ASPIRIN EC 81 MG PO TBEC
81.0000 mg | DELAYED_RELEASE_TABLET | Freq: Every day | ORAL | Status: DC
  Administered 2014-01-29 – 2014-02-13 (×16): 81 mg via ORAL
  Filled 2014-01-29 (×16): qty 1

## 2014-01-29 MED ORDER — SODIUM CHLORIDE 0.9 % IJ SOLN
3.0000 mL | INTRAMUSCULAR | Status: DC | PRN
  Administered 2014-02-05: 3 mL via INTRAVENOUS
  Filled 2014-01-29: qty 3

## 2014-01-29 MED ORDER — SODIUM CHLORIDE 0.9 % IJ SOLN
3.0000 mL | Freq: Two times a day (BID) | INTRAMUSCULAR | Status: DC
  Administered 2014-01-29 – 2014-02-13 (×29): 3 mL via INTRAVENOUS

## 2014-01-29 MED ORDER — ALUM & MAG HYDROXIDE-SIMETH 200-200-20 MG/5ML PO SUSP: 30.0000 mL | Freq: Four times a day (QID) | ORAL | Status: DC | PRN

## 2014-01-29 MED ORDER — ONDANSETRON HCL 4 MG PO TABS: 4.0000 mg | ORAL_TABLET | Freq: Four times a day (QID) | ORAL | Status: DC | PRN

## 2014-01-29 MED ORDER — PILOCARPINE HCL 1 % OP SOLN
1.0000 [drp] | Freq: Four times a day (QID) | OPHTHALMIC | Status: DC
  Administered 2014-01-29 – 2014-02-13 (×57): 1 [drp] via OPHTHALMIC
  Filled 2014-01-29 (×2): qty 15

## 2014-01-29 MED ORDER — SENNOSIDES-DOCUSATE SODIUM 8.6-50 MG PO TABS
1.0000 | ORAL_TABLET | Freq: Two times a day (BID) | ORAL | Status: DC
  Administered 2014-01-29 – 2014-02-13 (×27): 1 via ORAL
  Filled 2014-01-29 (×33): qty 1

## 2014-01-29 MED ORDER — LORATADINE 10 MG PO TABS
10.0000 mg | ORAL_TABLET | Freq: Every day | ORAL | Status: DC
  Administered 2014-01-29 – 2014-02-13 (×16): 10 mg via ORAL
  Filled 2014-01-29 (×16): qty 1

## 2014-01-29 MED ORDER — ACETAMINOPHEN 650 MG RE SUPP: 650.0000 mg | Freq: Four times a day (QID) | RECTAL | Status: DC | PRN

## 2014-01-29 MED ORDER — SODIUM BICARBONATE 650 MG PO TABS
1300.0000 mg | ORAL_TABLET | Freq: Three times a day (TID) | ORAL | Status: DC
  Administered 2014-01-29 (×2): 1300 mg via ORAL
  Filled 2014-01-29 (×5): qty 2

## 2014-01-29 MED ORDER — SODIUM CHLORIDE 0.9 % IV BOLUS (SEPSIS)
500.0000 mL | Freq: Once | INTRAVENOUS | Status: AC
  Administered 2014-01-29: 500 mL via INTRAVENOUS

## 2014-01-29 MED ORDER — ACETAMINOPHEN 325 MG PO TABS
650.0000 mg | ORAL_TABLET | Freq: Four times a day (QID) | ORAL | Status: DC | PRN
  Administered 2014-01-30: 650 mg via ORAL
  Filled 2014-01-29: qty 2

## 2014-01-29 MED ORDER — SODIUM CHLORIDE 0.9 % IJ SOLN
3.0000 mL | Freq: Two times a day (BID) | INTRAMUSCULAR | Status: DC
  Administered 2014-01-30: 3 mL via INTRAVENOUS

## 2014-01-29 MED ORDER — LEVOTHYROXINE SODIUM 25 MCG PO TABS
25.0000 ug | ORAL_TABLET | Freq: Every day | ORAL | Status: DC
  Administered 2014-01-31 – 2014-02-13 (×14): 25 ug via ORAL
  Filled 2014-01-29 (×20): qty 1

## 2014-01-29 MED ORDER — MOMETASONE FURO-FORMOTEROL FUM 100-5 MCG/ACT IN AERO
2.0000 | INHALATION_SPRAY | Freq: Two times a day (BID) | RESPIRATORY_TRACT | Status: DC
  Administered 2014-01-29 – 2014-02-13 (×28): 2 via RESPIRATORY_TRACT
  Filled 2014-01-29: qty 8.8

## 2014-01-29 NOTE — ED Notes (Addendum)
Pt presented to [facility] with acute renal failure. Has had several rounds of dialysis and kidneys seemed to be getting better; rehabing. Creatinine trending higher. Had appt with renal MD at 1545 and was told she will be admitted. Was sent to ER for faster dialysis and admission. Pt has superficial blood clot in left arm. Was receiving dialysis in neck; currently has no access for it now.

## 2014-01-29 NOTE — ED Provider Notes (Signed)
CSN: [id number]     Arrival date & time 01/29/14  8295 History   First MD Initiated Contact with Patient 01/29/14 0756     Chief Complaint  Patient presents with  . Acute Renal Failure     (Consider location/radiation/quality/duration/timing/severity/associated sxs/prior Treatment) HPI Comments: Patient presents emergency department with chief complaint of renal failure. She states she was told by her doctor to come to the emergency department to be admitted and have dialysis. She states that she was admitted approximately one week ago for similar symptoms. She stayed in the hospital for several days and had dialysis. She was seen by Dr. Marisue Humble from nephrology, who was optimistic that the patient would not require lifelong dialysis. She was then discharged to a rehabilitation center. She has been receiving occupational and physical therapy. She was told today to come to the emergency department because of her worsening blood work. She states that she feels weak, tired, has pain in her legs, and has had some shortness of breath. She has not tried taking anything to alleviate her symptoms. There are no aggravating or alleviating factors.  The history is provided by the patient. No language interpreter was used.    Past Medical History  Diagnosis Date  . Hypertension   . Hypothyroidism   . Seasonal allergies   . Glaucoma   . COPD (chronic obstructive pulmonary disease)   . Former smoker   . Renal disorder    History reviewed. No pertinent past surgical history. History reviewed. No pertinent family history. History  Substance Use Topics  . Smoking status: Former Games developer  . Smokeless tobacco: Not on file  . Alcohol Use: Not on file   OB History    No data available     Review of Systems  Constitutional: Positive for fatigue. Negative for fever and chills.  Respiratory: Positive for shortness of breath.   Cardiovascular: Negative for chest pain.  Gastrointestinal: Positive for  nausea. Negative for vomiting, diarrhea and constipation.  Genitourinary: Negative for dysuria.  Neurological: Positive for weakness.  All other systems reviewed and are negative.     Allergies  Ciprofloxacin  Home Medications   Prior to Admission medications   Medication Sig Start Date End Date Taking? Authorizing Provider  ascorbic acid (VITAMIN C) 1000 MG tablet Take 1,000 mg by mouth 3 (three) times daily.    Historical Provider, MD  aspirin EC 81 MG tablet Take 81 mg by mouth daily.    Historical Provider, MD  azelastine (ASTELIN) 0.1 % nasal spray Place 1 spray into both nostrils daily. Use in each nostril as directed    Historical Provider, MD  Brinzolamide-Brimonidine 1-0.2 % SUSP Place 1 drop into both eyes 2 (two) times daily.    Historical Provider, MD  cholecalciferol (VITAMIN D) 1000 UNITS tablet Take 1,000 Units by mouth daily.    Historical Provider, MD  Fluticasone-Salmeterol (ADVAIR DISKUS) 250-50 MCG/DOSE AEPB Inhale 1 puff into the lungs 2 (two) times daily.    Historical Provider, MD  folic acid (FOLVITE) 1 MG tablet Take 1 tablet (1 mg total) by mouth daily. 01/21/14   Maryann Mikhail, DO  Garlic 1000 MG CAPS Take 1 capsule by mouth daily.    Historical Provider, MD  latanoprost (XALATAN) 0.005 % ophthalmic solution Place 1 drop into both eyes at bedtime.    Historical Provider, MD  levothyroxine (SYNTHROID, LEVOTHROID) 25 MCG tablet Take 25 mcg by mouth daily before breakfast.    Historical Provider, MD  loratadine (CLARITIN) 10  MG tablet Take 10 mg by mouth daily.    Historical Provider, MD  LUTEIN-ZEAXANTHIN PO Take 1 tablet by mouth daily.    Historical Provider, MD  Multiple Vitamins-Minerals (MULTIVITAMIN WITH MINERALS) tablet Take 1 tablet by mouth daily.    Historical Provider, MD  Nutritional Supplements (FEEDING SUPPLEMENT, NEPRO CARB STEADY,) LIQD Take 237 mLs by mouth as needed (missed meal during dialysis.). 01/21/14   Maryann Mikhail, DO  Nutritional  Supplements (FEEDING SUPPLEMENT, NEPRO CARB STEADY,) LIQD Take 237 mLs by mouth 2 (two) times daily between meals. 01/21/14   Maryann Mikhail, DO  Omega-3 Fatty Acids (FISH OIL) 1000 MG CAPS Take 1 capsule by mouth daily.    Historical Provider, MD  omeprazole (PRILOSEC) 40 MG capsule Take 40 mg by mouth daily.    Historical Provider, MD  pilocarpine (PILOCAR) 1 % ophthalmic solution Place 1 drop into the right eye 4 (four) times daily.    Historical Provider, MD  PROAIR HFA 108 (90 BASE) MCG/ACT inhaler Inhale 2 puffs into the lungs every 6 (six) hours as needed for wheezing or shortness of breath.  11/20/13   Historical Provider, MD  senna-docusate (SENOKOT-S) 8.6-50 MG per tablet Take 1 tablet by mouth 2 (two) times daily. 01/21/14   Maryann Mikhail, DO  thiamine 100 MG tablet Take 1 tablet (100 mg total) by mouth daily. 01/21/14   Maryann Mikhail, DO   BP 157/80 mmHg  Pulse 128  Temp(Src) 97.4 F (36.3 C) (Oral)  Resp 28  Ht 5\' 8"  (1.727 m)  Wt 207 lb (93.895 kg)  BMI 31.48 kg/m2  SpO2 100% Physical Exam  Constitutional: She is oriented to person, place, and time. She appears well-developed and well-nourished.  HENT:  Head: Normocephalic and atraumatic.  Eyes: Conjunctivae and EOM are normal. Pupils are equal, round, and reactive to light.  Neck: Normal range of motion. Neck supple.  Cardiovascular: Regular rhythm.  Exam reveals no gallop and no friction rub.   No murmur heard. Tachycardic  Intact distal pulses  Pulmonary/Chest: Effort normal and breath sounds normal. No respiratory distress. She has no wheezes. She has no rales. She exhibits no tenderness.  Abdominal: Soft. Bowel sounds are normal. She exhibits no distension and no mass. There is no tenderness. There is no rebound and no guarding.  No focal abdominal tenderness, no RLQ tenderness or pain at McBurney's point, no RUQ tenderness or Murphy's sign, no left-sided abdominal tenderness, no fluid wave, or signs of peritonitis    Musculoskeletal: Normal range of motion. She exhibits no edema or tenderness.  Neurological: She is alert and oriented to person, place, and time.  Skin: Skin is warm and dry.  Left hand cool to touch, but pulses intact  Psychiatric: She has a normal mood and affect. Her behavior is normal. Judgment and thought content normal.  Nursing note and vitals reviewed.   ED Course  Procedures (including critical care time) Results for orders placed or performed during the hospital encounter of 01/29/14  Comprehensive metabolic panel  Result Value Ref Range   Sodium 140 137 - 147 mEq/L   Potassium 3.8 3.7 - 5.3 mEq/L   Chloride 104 96 - 112 mEq/L   CO2 17 (L) 19 - 32 mEq/L   Glucose, Bld 123 (H) 70 - 99 mg/dL   BUN 61 (H) 6 - 23 mg/dL   Creatinine, Ser 1.307.94 (H) 0.50 - 1.10 mg/dL   Calcium 9.5 8.4 - 86.510.5 mg/dL   Total Protein 9.3 (H) 6.0 - 8.3  g/dL   Albumin 3.0 (L) 3.5 - 5.2 g/dL   AST 9 0 - 37 U/L   ALT 9 0 - 35 U/L   Alkaline Phosphatase 87 39 - 117 U/L   Total Bilirubin 0.3 0.3 - 1.2 mg/dL   GFR calc non Af Amer 4 (L) >90 mL/min   GFR calc Af Amer 5 (L) >90 mL/min   Anion gap 19 (H) 5 - 15  CBC with Differential  Result Value Ref Range   WBC 11.5 (H) 4.0 - 10.5 K/uL   RBC 3.79 (L) 3.87 - 5.11 MIL/uL   Hemoglobin 11.0 (L) 12.0 - 15.0 g/dL   HCT 16.1 (L) 09.6 - 04.5 %   MCV 90.8 78.0 - 100.0 fL   MCH 29.0 26.0 - 34.0 pg   MCHC 32.0 30.0 - 36.0 g/dL   RDW 40.9 (H) 81.1 - 91.4 %   Platelets 303 150 - 400 K/uL   Neutrophils Relative % 60 43 - 77 %   Neutro Abs 6.9 1.7 - 7.7 K/uL   Lymphocytes Relative 21 12 - 46 %   Lymphs Abs 2.4 0.7 - 4.0 K/uL   Monocytes Relative 10 3 - 12 %   Monocytes Absolute 1.1 (H) 0.1 - 1.0 K/uL   Eosinophils Relative 8 (H) 0 - 5 %   Eosinophils Absolute 0.9 (H) 0.0 - 0.7 K/uL   Basophils Relative 1 0 - 1 %   Basophils Absolute 0.2 (H) 0.0 - 0.1 K/uL  I-Stat Chem 8, ED  Result Value Ref Range   Sodium 142 137 - 147 mEq/L   Potassium 3.6 (L) 3.7 -  5.3 mEq/L   Chloride 111 96 - 112 mEq/L   BUN 60 (H) 6 - 23 mg/dL   Creatinine, Ser 7.82 (H) 0.50 - 1.10 mg/dL   Glucose, Bld 956 (H) 70 - 99 mg/dL   Calcium, Ion 2.13 0.86 - 1.30 mmol/L   TCO2 19 0 - 100 mmol/L   Hemoglobin 12.6 12.0 - 15.0 g/dL   HCT 57.8 46.9 - 62.9 %  I-Stat Troponin, ED (not at [facility])  Result Value Ref Range   Troponin i, poc 0.01 0.00 - 0.08 ng/mL   Comment 3           US Renal  01/08/2014   CLINICAL DATA:  Acute renal failure  EXAM: RENAL/URINARY TRACT ULTRASOUND COMPLETE  COMPARISON:  None.  FINDINGS: Right Kidney:  Length: 15 cm. Increased renal cortical echogenicity. No mass or hydronephrosis visualized.  Left Kidney:  Length: 12.3 cm. Increased renal cortical echogenicity. No mass or hydronephrosis visualized.  Bladder:  Appears normal for degree of bladder distention.  IMPRESSION: 1. No obstructive uropathy. 2. Increased renal cortical echogenicity as can be seen with medical renal disease.   Electronically Signed   By: Elige Ko   On: 01/08/2014 21:15   Dg Chest Port 1 View  01/29/2014   CLINICAL DATA:  New onset of nausea and shortness of Breath  EXAM: PORTABLE CHEST - 1 VIEW  COMPARISON:  01/09/2014  FINDINGS: Cardiomegaly again noted. Atherosclerotic calcifications of thoracic aorta. No acute infiltrate or pulmonary edema. Mild basilar atelectasis.  IMPRESSION: No active disease.  Mild basilar atelectasis.   Electronically Signed   By: Natasha Mead M.D.   On: 01/29/2014 08:38   Dg Chest Port 1 View  01/09/2014   CLINICAL DATA:  Hypoxia  EXAM: PORTABLE CHEST - 1 VIEW  COMPARISON:  01/08/2014  FINDINGS: Cardiomediastinal silhouette is stable. Mild elevation of  the left hemidiaphragm. There is central mild vascular congestion and mild interstitial prominence suspicious for mild interstitial edema. Small left pleural effusion with left basilar atelectasis or infiltrate. Right IJ sheath is unchanged in position.  IMPRESSION: Mild vascular congestion mild  interstitial prominence suspicious for mild interstitial edema. Small left pleural effusion with left basilar atelectasis or infiltrate.   Electronically Signed   By: Natasha MeadLiviu  Pop M.D.   On: 01/09/2014 10:45   Dg Chest Port 1 View  01/08/2014   CLINICAL DATA:  Evaluate central line placement.  EXAM: PORTABLE CHEST - 1 VIEW  COMPARISON:  01/08/2014  FINDINGS: There is a right IJ catheter with tip in the projection of the SVC. No pneumothorax identified. Mild cardiac enlargement. There is calcified plaque identified within the aortic arch. Pulmonary vascular congestion is noted. There is bibasilar atelectasis.  IMPRESSION: 1. Tip of the IJ catheter is in the SVC.  No pneumothorax. 2. Cardiac enlargement, atherosclerotic disease and pulmonary vascular congestion.   Electronically Signed   By: Signa Kellaylor  Stroud M.D.   On: 01/08/2014 23:52   Dg Abd Portable 1v  01/13/2014   CLINICAL DATA:  Generalized abdominal pain, nausea.  EXAM: PORTABLE ABDOMEN - 1 VIEW  COMPARISON:  January 08, 2014.  FINDINGS: The bowel gas pattern is normal. No radio-opaque calculi or other significant radiographic abnormality are seen.  IMPRESSION: No evidence of bowel obstruction or ileus.   Electronically Signed   By: Roque LiasJames  Green M.D.   On: 01/13/2014 18:18     Imaging Review No results found.   EKG Interpretation   Date/Time:  Tuesday January 29 2014 08:07:45 EST Ventricular Rate:  131 PR Interval:  154 QRS Duration: 99 QT Interval:  307 QTC Calculation: 453 R Axis:   -25 Text Interpretation:  Sinus tachycardia Paired ventricular premature  complexes Borderline left axis deviation Low voltage, extremity and  precordial leads Consider anterior infarct Confirmed by ZAMMIT  MD, JOSEPH  ([id number]) on 01/29/2014 8:12:53 AM      MDM   Final diagnoses:  SOB (shortness of breath)  Acute renal failure, unspecified acute renal failure type    Patient was recently admitted for acute renal failure. She is currently  staying at a rehabilitation center for physical therapy. She was notified by her doctor that her labs were worsening and was told to come to the emergency department. Her creatinine today is 9.1. I discussed this with Dr. Marisue HumbleSanford of nephrology, who recommended medicine admission and workup for acute renal failure.  Dr. Marisue HumbleSanford is uncertain as to why the patient is in renal failure. Discussed patient with Dr. Vanessa BarbaraZamora from [facility], who will admit the patient.      Roxy Horsemanobert Jahni Paul, PA-C 01/29/14 0900  Benny LennertJoseph L Zammit, MD 01/30/14 [phone]

## 2014-01-29 NOTE — ED Notes (Signed)
RN and NT in and out cathed pt. No urine returned. Notified PA. Will continue to monitor.

## 2014-01-29 NOTE — H&P (Signed)
[facility] History and Physical  [name] ZOX:[id number] DOB: 02/13/1935 DOA: 01/29/2014  Referring physician:  PCP: No primary care provider on file.   Chief Complaint: Generalized weakness/acute renal failure  HPI: [name] is a 79 y.o. female presenting as a transfer from her nursing home, recently discharged from the medicine service on 01/21/2014, admitted on 01/08/2014 at which time she presented with complaints of diarrhea and abdominal pain. On 01/08/2014 she presented with a creatinine of 11.12 and BUN of 91. Her potassium was greater than 7.7. Nephrology evaluated her and she underwent urgent hemodialysis with placement of temporary dialysis catheter. Her creatinine coming down to 6.85 on 01/21/2014. At the time she had good urine output and having downward trending creatinine, hemodialysis catheter was removed and discharge to skilled nursing facility. At her facility she has had ongoing generalized weakness, fatigue, poor tolerance to physical exertion. Her son who is present at bedside reports that she "picks at her food." She also complains of intermittent abdominal pain, had episode of nausea and vomiting yesterday. Eyes bloody stools, hematemesis, melena. Repeat lab work now shown a creatinine of 9.1 with BUN of 60. Her potassium was 3.6. I spoke with Dr. Darrick Penna of nephrology for consultation.                                                                                                                          Review of Systems:  Constitutional:  No weight loss, night sweats, Fevers, positive for  chills, fatigue, poor tolerance to physical exertion.  HEENT:  No headaches, Difficulty swallowing,Tooth/dental problems,Sore throat,  No sneezing, itching, ear ache, nasal congestion, post nasal drip,  Cardio-vascular:  No chest pain, Orthopnea, PND, swelling in lower extremities, anasarca, dizziness, palpitations  GI:  No heartburn, indigestion,  positive for  abdominal pain, nausea, vomiting, diarrhea, change in bowel habits, loss of appetite  Resp:  No shortness of breath with exertion or at rest. No excess mucus, no productive cough, No non-productive cough, No coughing up of blood.No change in color of mucus.No wheezing.No chest wall deformity  Skin:  no rash or lesions.  GU:  no dysuria, change in color of urine, no urgency or frequency. No flank pain.  Musculoskeletal:  No joint pain or swelling. No decreased range of motion. No back pain.  Psych:  No change in mood or affect. No depression or anxiety. No memory loss.   Past Medical History  Diagnosis Date  . Hypertension   . Hypothyroidism   . Seasonal allergies   . Glaucoma   . COPD (chronic obstructive pulmonary disease)   . Former smoker   . Renal disorder    History reviewed. No pertinent past surgical history. Social History:  reports that she has quit smoking. She does not have any smokeless tobacco history on file. Her alcohol and drug histories are not on file.  Allergies  Allergen Reactions  . Ciprofloxacin Nausea And Vomiting    History reviewed. No pertinent family  history.   Prior to Admission medications   Medication Sig Start Date End Date Taking? Authorizing Provider  albuterol (PROVENTIL) (2.5 MG/3ML) 0.083% nebulizer solution Take 2.5 mg by nebulization every 4 (four) hours as needed for wheezing or shortness of breath.   Yes Historical Provider, MD  ascorbic acid (VITAMIN C) 1000 MG tablet Take 1,000 mg by mouth 3 (three) times daily.   Yes Historical Provider, MD  aspirin EC 81 MG tablet Take 81 mg by mouth daily.   Yes Historical Provider, MD  azelastine (ASTELIN) 0.1 % nasal spray Place 1 spray into both nostrils daily. Use in each nostril as directed   Yes Historical Provider, MD  Brinzolamide-Brimonidine 1-0.2 % SUSP Place 1 drop into both eyes 2 (two) times daily.   Yes Historical Provider, MD  cholecalciferol (VITAMIN D) 1000 UNITS tablet Take 1,000  Units by mouth daily.   Yes Historical Provider, MD  Fluticasone-Salmeterol (ADVAIR DISKUS) 250-50 MCG/DOSE AEPB Inhale 1 puff into the lungs 2 (two) times daily.   Yes Historical Provider, MD  folic acid (FOLVITE) 1 MG tablet Take 1 tablet (1 mg total) by mouth daily. 01/21/14  Yes Maryann Mikhail, DO  Garlic 1000 MG CAPS Take 1 capsule by mouth daily.   Yes Historical Provider, MD  latanoprost (XALATAN) 0.005 % ophthalmic solution Place 1 drop into both eyes at bedtime.   Yes Historical Provider, MD  levothyroxine (SYNTHROID, LEVOTHROID) 25 MCG tablet Take 25 mcg by mouth daily before breakfast.   Yes Historical Provider, MD  loratadine (CLARITIN) 10 MG tablet Take 10 mg by mouth daily.   Yes Historical Provider, MD  LUTEIN-ZEAXANTHIN PO Take 1 tablet by mouth daily.   Yes Historical Provider, MD  Multiple Vitamins-Minerals (MULTIVITAMIN WITH MINERALS) tablet Take 1 tablet by mouth daily.   Yes Historical Provider, MD  Omega-3 Fatty Acids (FISH OIL) 1000 MG CAPS Take 1 capsule by mouth daily.   Yes Historical Provider, MD  omeprazole (PRILOSEC) 40 MG capsule Take 40 mg by mouth daily.   Yes Historical Provider, MD  pilocarpine (PILOCAR) 1 % ophthalmic solution Place 1 drop into the right eye 4 (four) times daily.   Yes Historical Provider, MD  PROAIR HFA 108 (90 BASE) MCG/ACT inhaler Inhale 2 puffs into the lungs every 6 (six) hours as needed for wheezing or shortness of breath.  11/20/13  Yes Historical Provider, MD  senna-docusate (SENOKOT-S) 8.6-50 MG per tablet Take 1 tablet by mouth 2 (two) times daily. 01/21/14  Yes Maryann Mikhail, DO  thiamine 100 MG tablet Take 1 tablet (100 mg total) by mouth daily. 01/21/14  Yes Edsel PetrinMaryann Mikhail, DO   Physical Exam: Filed Vitals:   01/29/14 0900 01/29/14 0913 01/29/14 0930 01/29/14 1001  BP: 125/64 125/64 121/52 127/69  Pulse: 113 119 117 97  Temp:  97.9 F (36.6 C)  99 F (37.2 C)  TempSrc:  Oral  Oral  Resp: 18 28 23 24   Height:    5\' 8"  (1.727 m)   Weight:    83.7 kg (184 lb 8.4 oz)  SpO2: 94% 96% 96% 96%    Wt Readings from Last 3 Encounters:  01/29/14 83.7 kg (184 lb 8.4 oz)  01/20/14 87.1 kg (192 lb 0.3 oz)    General:  Appears calm and comfortable, in no acute distress. She is awake and alert, following commands  Eyes: PERRL, normal lids, irises & conjunctiva ENT: grossly normal hearing, lips & tongue, dry oral mucosa  Neck: no LAD, masses or thyromegaly  Cardiovascular:  tachycardic, RRR, no m/r/g. No LE edema. Telemetry:Sinus tachycardia  SR, no arrhythmias  Respiratory: She has bibasilar crackles, otherwise good air movement no wheezing rhonchi or rales. Abdomen: soft, ntnd Skin: no rash or induration seen on limited exam Musculoskeletal: grossly normal tone BUE/BLE Psychiatric: grossly normal mood and affect, speech fluent and appropriate Neurologic: grossly non-focal.          Labs on Admission:  Basic Metabolic Panel:  Recent Labs Lab 01/29/14 0810 01/29/14 0815  NA 140 142  K 3.8 3.6*  CL 104 111  CO2 17*  --   GLUCOSE 123* 130*  BUN 61* 60*  CREATININE 7.94* 9.10*  CALCIUM 9.5  --    Liver Function Tests:  Recent Labs Lab 01/29/14 0810  AST 9  ALT 9  ALKPHOS 87  BILITOT 0.3  PROT 9.3*  ALBUMIN 3.0*   No results for input(s): LIPASE, AMYLASE in the last 168 hours. No results for input(s): AMMONIA in the last 168 hours. CBC:  Recent Labs Lab 01/29/14 0810 01/29/14 0815  WBC 11.5*  --   NEUTROABS 6.9  --   HGB 11.0* 12.6  HCT 34.4* 37.0  MCV 90.8  --   PLT 303  --    Cardiac Enzymes: No results for input(s): CKTOTAL, CKMB, CKMBINDEX, TROPONINI in the last 168 hours.  BNP (last 3 results) No results for input(s): PROBNP in the last 8760 hours. CBG: No results for input(s): GLUCAP in the last 168 hours.  Radiological Exams on Admission: Dg Chest Port 1 View  01/29/2014   CLINICAL DATA:  New onset of nausea and shortness of Breath  EXAM: PORTABLE CHEST - 1 VIEW  COMPARISON:   01/09/2014  FINDINGS: Cardiomegaly again noted. Atherosclerotic calcifications of thoracic aorta. No acute infiltrate or pulmonary edema. Mild basilar atelectasis.  IMPRESSION: No active disease.  Mild basilar atelectasis.   Electronically Signed   By: Natasha MeadLiviu  Pop M.D.   On: 01/29/2014 08:38    EKG: Independently reviewed. Sinus tachycardia  Assessment/Plan Principal Problem:   AKI (acute kidney injury) Active Problems:   Abdominal pain, epigastric   FTT (failure to thrive) in adult   HTN (hypertension)   Other specified hypothyroidism   Generalized weakness   1. Acute renal failure. Patient was hospitalized for acute kidney injury that required hemodialysis as she was seen and evaluated by nephrology. It was felt that possibly hypovolemia/ATN may have precipitated acute kidney injury then. Per notes by the day of discharge she had good urine output. Her son reported that she remains weak with failure to thrive, having minimal by mouth intake at her facility. Will give her a 500 mL bolus of normal saline now as a fluid challenge. Labs revealed serum Creatinine of 9.1, BUN of 60, Potassium of 3.8 with bicarbonate of 17. She reported having episode of nausea and vomiting yesterday morning. Nephrology was consulted as I spoke with Dr Darrick Pennaeterding. Await further recommendations. 2. Abdominal pain. Patient reporting ongoing intermittent abdominal pain that she has had since last hospitalization. She denies hematemesis, bloody stools, melena. Liver function tests unremarkable. She did have mild leukocytosis of 11,500. Will check abdominal ultrasound as well as lipase level. 3. Failure to thrive. Patient having generalized weakness, functional decline, overall failure to thrive since her recent discharge. I think dehydration from minimal by mouth intake and worsening kidney function are contributors. Providing 500 mL fluid challenge. She did not appear volume overloaded on exam. Await further recommendations  from nephrology.  4. Hypothyroidism.  Her TSH was checked on 01/09/2014, back normal at 2.06. Will continue Synthroid 25 g by mouth daily 5. DVT prophylaxis. Subcutaneous heparin      Code Status: Full code Family Communication: I spoke to her son who was present at bedside Disposition Plan: Anticipate she will require greater than 2 nights hospitalization  Time spent: 70 min   Jeralyn Bennett [facility] Pager [phone]

## 2014-01-29 NOTE — ED Notes (Signed)
Family at bedside. 

## 2014-01-29 NOTE — Progress Notes (Signed)
Subjective: Interval History: 79 yr old female here from 11.24 to 12/7 for AKI assoc with Hx of D. Underwent HD x2. Etiology never determined but felt to be poss vol depletion.  Cr normal in 5/15.  Became nonoliguric and did not want furthur HD.  Cr was in 7.6 range on 12.7 and is 7.85 now (no change in Cr or GFR). Cr on istat in ER 9.1 but Renal profile 7.85 (istat is spurious most likely).  Really no change in how is feeling, but has limited appetite, has V x1 in the past week.  Some malaise and lack of energy.  Not more SOB (chronic O2 dependent COPD).  No PND, Orthop, edema, itching, ms cramping, rash, hematuria, or voiding sx.  No sores in eyes, dry eyes or dry mouth.  No D, but freq indigestion.  Gets cold easily but no fevers.  .  Objective: Vital signs in last 24 hours: Temp:  [97.4 F (36.3 C)-99 F (37.2 C)] 99 F (37.2 C) (12/15 1001) Pulse Rate:  [39-128] 97 (12/15 1001) Resp:  [18-29] 24 (12/15 1001) BP: (121-157)/(52-80) 127/69 mmHg (12/15 1001) SpO2:  [89 %-100 %] 96 % (12/15 1001) Weight:  [83.7 kg (184 lb 8.4 oz)-93.895 kg (207 lb)] 83.7 kg (184 lb 8.4 oz) (12/15 1001) Weight change:   Intake/Output from previous day:   Intake/Output this shift: Total I/O In: -  Out: 250 [Urine:250]  General appearance: alert, cooperative, moderately obese and pale Throat: edentulous Neck: PCL Resp: diminished breath sounds bilaterally Cardio: S1, S2 normal and systolic murmur: systolic ejection 2/6, decrescendo at 2nd left intercostal space GI: pos bs, liver down 3-4 m, soft,obese Extremities: extremities normal, atraumatic, no cyanosis or edema Skin: Skin color, texture, turgor normal. No rashes or lesions  Lab Results:  Recent Labs  01/29/14 0810 01/29/14 0815  WBC 11.5*  --   HGB 11.0* 12.6  HCT 34.4* 37.0  PLT 303  --    BMET:  Recent Labs  01/29/14 0810 01/29/14 0815  NA 140 142  K 3.8 3.6*  CL 104 111  CO2 17*  --   GLUCOSE 123* 130*  BUN 61* 60*   CREATININE 7.94* 9.10*  CALCIUM 9.5  --    No results for input(s): PTH in the last 72 hours. Iron Studies: No results for input(s): IRON, TIBC, TRANSFERRIN, FERRITIN in the last 72 hours.  Studies/Results: Dg Chest Port 1 View  01/29/2014   CLINICAL DATA:  New onset of nausea and shortness of Breath  EXAM: PORTABLE CHEST - 1 VIEW  COMPARISON:  01/09/2014  FINDINGS: Cardiomegaly again noted. Atherosclerotic calcifications of thoracic aorta. No acute infiltrate or pulmonary edema. Mild basilar atelectasis.  IMPRESSION: No active disease.  Mild basilar atelectasis.   Electronically Signed   By: Natasha MeadLiviu  Pop M.D.   On: 01/29/2014 08:38    I have reviewed the patient's current medications.  Assessment/Plan: 1 AKI nonresolving, no change since D/C but function is very low.  Suspect nonresolving ATN Vs AIN (pyuria and ^ eos).  Mild uremia.  Will get U Na/Cr, repeat diff, low vol IVF, and if not better, get PC and start HD.  Will need biopsy in a few days. 2 Obesity 3 Oxygen dependent COPD 4 Anemia 5 Eosinophilia 6 GERD P IVF, Na bicarb, U Na and Cr, if not better, PC and HD    LOS: 0 days   [name] L 01/29/2014,12:50 PM

## 2014-01-30 ENCOUNTER — Inpatient Hospital Stay (HOSPITAL_COMMUNITY): Payer: Medicare Other

## 2014-01-30 ENCOUNTER — Encounter (HOSPITAL_COMMUNITY): Payer: Self-pay | Admitting: Anesthesiology

## 2014-01-30 ENCOUNTER — Encounter (HOSPITAL_COMMUNITY)
Admission: EM | Disposition: A | Discharge status: Home / Self Care | Payer: Self-pay | Source: Home / Self Care | Attending: Family Medicine

## 2014-01-30 ENCOUNTER — Inpatient Hospital Stay (HOSPITAL_COMMUNITY): Payer: Medicare Other | Admitting: Certified Registered Nurse Anesthetist

## 2014-01-30 DIAGNOSIS — E43 Unspecified severe protein-calorie malnutrition: Secondary | ICD-10-CM | POA: Insufficient documentation

## 2014-01-30 DIAGNOSIS — E038 Other specified hypothyroidism: Secondary | ICD-10-CM

## 2014-01-30 DIAGNOSIS — N185 Chronic kidney disease, stage 5: Secondary | ICD-10-CM

## 2014-01-30 HISTORY — PX: INSERTION OF DIALYSIS CATHETER: SHX1324

## 2014-01-30 LAB — BASIC METABOLIC PANEL
ANION GAP: 16 — AB (ref 5–15)
BUN: 51 mg/dL — AB (ref 6–23)
CHLORIDE: 103 meq/L (ref 96–112)
CO2: 21 mEq/L (ref 19–32)
Calcium: 8.7 mg/dL (ref 8.4–10.5)
Creatinine, Ser: 6.57 mg/dL — ABNORMAL HIGH (ref 0.50–1.10)
GFR calc Af Amer: 6 mL/min — ABNORMAL LOW (ref >90)
GFR, EST NON AFRICAN AMERICAN: 5 mL/min — AB (ref >90)
Glucose, Bld: 141 mg/dL — ABNORMAL HIGH (ref 70–99)
Potassium: 3.3 mEq/L — ABNORMAL LOW (ref 3.7–5.3)
Sodium: 140 mEq/L (ref 137–147)

## 2014-01-30 LAB — PLATELET FUNCTION ASSAY: COLLAGEN / EPINEPHRINE: 84 s (ref 0–184)

## 2014-01-30 LAB — APTT: APTT: 30 s (ref 24–37)

## 2014-01-30 LAB — CBC
HCT: 31.5 % — ABNORMAL LOW (ref 36.0–46.0)
Hemoglobin: 9.8 g/dL — ABNORMAL LOW (ref 12.0–15.0)
MCH: 28.4 pg (ref 26.0–34.0)
MCHC: 31.1 g/dL (ref 30.0–36.0)
MCV: 91.3 fL (ref 78.0–100.0)
PLATELETS: 270 10*3/uL (ref 150–400)
RBC: 3.45 MIL/uL — AB (ref 3.87–5.11)
RDW: 15.9 % — ABNORMAL HIGH (ref 11.5–15.5)
WBC: 8.6 10*3/uL (ref 4.0–10.5)

## 2014-01-30 LAB — DIFFERENTIAL
Basophils Absolute: 0.1 10*3/uL (ref 0.0–0.1)
Basophils Relative: 1 % (ref 0–1)
EOS ABS: 0.8 10*3/uL — AB (ref 0.0–0.7)
Eosinophils Relative: 9 % — ABNORMAL HIGH (ref 0–5)
LYMPHS ABS: 1.2 10*3/uL (ref 0.7–4.0)
Lymphocytes Relative: 14 % (ref 12–46)
Monocytes Absolute: 1.1 10*3/uL — ABNORMAL HIGH (ref 0.1–1.0)
Monocytes Relative: 13 % — ABNORMAL HIGH (ref 3–12)
NEUTROS PCT: 63 % (ref 43–77)
Neutro Abs: 5.4 10*3/uL (ref 1.7–7.7)

## 2014-01-30 LAB — PARATHYROID HORMONE, INTACT (NO CA): PTH: 51 pg/mL (ref 14–64)

## 2014-01-30 LAB — COMPREHENSIVE METABOLIC PANEL
ALBUMIN: 2.5 g/dL — AB (ref 3.5–5.2)
ALT: 7 U/L (ref 0–35)
ANION GAP: 18 — AB (ref 5–15)
AST: 8 U/L (ref 0–37)
Alkaline Phosphatase: 76 U/L (ref 39–117)
BILIRUBIN TOTAL: 0.2 mg/dL — AB (ref 0.3–1.2)
BUN: 61 mg/dL — AB (ref 6–23)
CALCIUM: 9 mg/dL (ref 8.4–10.5)
CHLORIDE: 106 meq/L (ref 96–112)
CO2: 17 mEq/L — ABNORMAL LOW (ref 19–32)
CREATININE: 7.64 mg/dL — AB (ref 0.50–1.10)
GFR calc Af Amer: 5 mL/min — ABNORMAL LOW (ref >90)
GFR calc non Af Amer: 5 mL/min — ABNORMAL LOW (ref >90)
Glucose, Bld: 134 mg/dL — ABNORMAL HIGH (ref 70–99)
Potassium: 3.6 mEq/L — ABNORMAL LOW (ref 3.7–5.3)
Sodium: 141 mEq/L (ref 137–147)
Total Protein: 8.1 g/dL (ref 6.0–8.3)

## 2014-01-30 LAB — MAGNESIUM: Magnesium: 2.2 mg/dL (ref 1.5–2.5)

## 2014-01-30 LAB — HEPATITIS B SURFACE ANTIGEN: HEP B S AG: NEGATIVE

## 2014-01-30 LAB — CREATININE, URINE, RANDOM: Creatinine, Urine: 72.8 mg/dL

## 2014-01-30 LAB — HEPATITIS B SURFACE ANTIBODY,QUALITATIVE: HEP B S AB: NEGATIVE

## 2014-01-30 LAB — PROTIME-INR
INR: 1.13 (ref 0.00–1.49)
PROTHROMBIN TIME: 14.7 s (ref 11.6–15.2)

## 2014-01-30 LAB — PHOSPHORUS: Phosphorus: 7.6 mg/dL — ABNORMAL HIGH (ref 2.3–4.6)

## 2014-01-30 LAB — SODIUM, URINE, RANDOM: Sodium, Ur: 71 mEq/L

## 2014-01-30 SURGERY — INSERTION OF DIALYSIS CATHETER
Anesthesia: Monitor Anesthesia Care | Laterality: Right

## 2014-01-30 MED ORDER — LIDOCAINE HCL (CARDIAC) 20 MG/ML IV SOLN
INTRAVENOUS | Status: AC
  Filled 2014-01-30: qty 5

## 2014-01-30 MED ORDER — 0.9 % SODIUM CHLORIDE (POUR BTL) OPTIME
TOPICAL | Status: DC | PRN
  Administered 2014-01-30: 1000 mL

## 2014-01-30 MED ORDER — SODIUM CHLORIDE 0.9 % IV SOLN: 100.0000 mL | INTRAVENOUS | Status: DC | PRN

## 2014-01-30 MED ORDER — PROPOFOL 10 MG/ML IV BOLUS
INTRAVENOUS | Status: AC
  Filled 2014-01-30: qty 20

## 2014-01-30 MED ORDER — LIDOCAINE-EPINEPHRINE (PF) 1 %-1:200000 IJ SOLN
INTRAMUSCULAR | Status: DC | PRN
  Administered 2014-01-30: 16 mL

## 2014-01-30 MED ORDER — HEPARIN SODIUM (PORCINE) 5000 UNIT/ML IJ SOLN
INTRAMUSCULAR | Status: DC | PRN
  Administered 2014-01-30: 13:00:00

## 2014-01-30 MED ORDER — PROPOFOL INFUSION 10 MG/ML OPTIME
INTRAVENOUS | Status: DC | PRN
  Administered 2014-01-30: 75 ug/kg/min via INTRAVENOUS

## 2014-01-30 MED ORDER — LIDOCAINE HCL (PF) 1 % IJ SOLN: 5.0000 mL | INTRAMUSCULAR | Status: DC | PRN

## 2014-01-30 MED ORDER — ALTEPLASE 2 MG IJ SOLR
2.0000 mg | Freq: Once | INTRAMUSCULAR | Status: DC | PRN
  Filled 2014-01-30: qty 2

## 2014-01-30 MED ORDER — HEPARIN SODIUM (PORCINE) 1000 UNIT/ML IJ SOLN
INTRAMUSCULAR | Status: DC | PRN
  Administered 2014-01-30: 4.2 [IU]

## 2014-01-30 MED ORDER — SODIUM CHLORIDE 0.9 % IV SOLN
INTRAVENOUS | Status: DC
Start: 2014-01-30 — End: 2014-02-13
  Administered 2014-01-30: 12:00:00 via INTRAVENOUS

## 2014-01-30 MED ORDER — HEPARIN SODIUM (PORCINE) 1000 UNIT/ML IJ SOLN
INTRAMUSCULAR | Status: AC
  Filled 2014-01-30: qty 1

## 2014-01-30 MED ORDER — HEPARIN SODIUM (PORCINE) 1000 UNIT/ML DIALYSIS
40.0000 [IU]/kg | Freq: Once | INTRAMUSCULAR | Status: DC
  Filled 2014-01-30: qty 4

## 2014-01-30 MED ORDER — NEPRO/CARBSTEADY PO LIQD
237.0000 mL | Freq: Two times a day (BID) | ORAL | Status: DC
  Administered 2014-01-31 – 2014-02-13 (×18): 237 mL via ORAL

## 2014-01-30 MED ORDER — LIDOCAINE-PRILOCAINE 2.5-2.5 % EX CREA: 1.0000 "application " | TOPICAL_CREAM | CUTANEOUS | Status: DC | PRN

## 2014-01-30 MED ORDER — HYDROMORPHONE HCL 1 MG/ML IJ SOLN: 0.2500 mg | INTRAMUSCULAR | Status: DC | PRN

## 2014-01-30 MED ORDER — NEPRO/CARBSTEADY PO LIQD: 237.0000 mL | ORAL | Status: DC | PRN

## 2014-01-30 MED ORDER — SODIUM CHLORIDE 0.9 % IV SOLN
INTRAVENOUS | Status: DC | PRN
  Administered 2014-01-30: 12:00:00 via INTRAVENOUS

## 2014-01-30 MED ORDER — ONDANSETRON HCL 4 MG/2ML IJ SOLN: 4.0000 mg | Freq: Once | INTRAMUSCULAR | Status: DC | PRN

## 2014-01-30 MED ORDER — ALTEPLASE 2 MG IJ SOLR
4.0000 mg | INTRAMUSCULAR | Status: AC
  Administered 2014-01-30: 4 mg
  Filled 2014-01-30: qty 4

## 2014-01-30 MED ORDER — PENTAFLUOROPROP-TETRAFLUOROETH EX AERO: 1.0000 "application " | INHALATION_SPRAY | CUTANEOUS | Status: DC | PRN

## 2014-01-30 MED ORDER — HEPARIN SODIUM (PORCINE) 1000 UNIT/ML DIALYSIS
1000.0000 [IU] | INTRAMUSCULAR | Status: DC | PRN
  Filled 2014-01-30: qty 1

## 2014-01-30 MED ORDER — CEFAZOLIN SODIUM 1-5 GM-% IV SOLN
INTRAVENOUS | Status: DC | PRN
  Administered 2014-01-30: 1 g via INTRAVENOUS

## 2014-01-30 MED ORDER — PANTOPRAZOLE SODIUM 40 MG PO TBEC
40.0000 mg | DELAYED_RELEASE_TABLET | Freq: Every day | ORAL | Status: DC
  Administered 2014-01-30 – 2014-02-13 (×16): 40 mg via ORAL
  Filled 2014-01-30 (×13): qty 1

## 2014-01-30 SURGICAL SUPPLY — 41 items
BAG DECANTER FOR FLEXI CONT (MISCELLANEOUS) IMPLANT
CATH CANNON HEMO 15F 50CM (CATHETERS) IMPLANT
CATH CANNON HEMO 15FR 19 (HEMODIALYSIS SUPPLIES) ×3 IMPLANT
CATH CANNON HEMO 15FR 23CM (HEMODIALYSIS SUPPLIES) IMPLANT
CATH CANNON HEMO 15FR 31CM (HEMODIALYSIS SUPPLIES) IMPLANT
CATH CANNON HEMO 15FR 32CM (HEMODIALYSIS SUPPLIES) IMPLANT
COVER PROBE W GEL 5X96 (DRAPES) ×3 IMPLANT
COVER SURGICAL LIGHT HANDLE (MISCELLANEOUS) ×3 IMPLANT
DECANTER SPIKE VIAL GLASS SM (MISCELLANEOUS) IMPLANT
DRAPE C-ARM 42X72 X-RAY (DRAPES) ×3 IMPLANT
DRAPE CHEST BREAST 15X10 FENES (DRAPES) ×3 IMPLANT
GAUZE SPONGE 2X2 8PLY STRL LF (GAUZE/BANDAGES/DRESSINGS) ×1 IMPLANT
GAUZE SPONGE 4X4 16PLY XRAY LF (GAUZE/BANDAGES/DRESSINGS) ×3 IMPLANT
GLOVE BIOGEL PI IND STRL 6.5 (GLOVE) ×1 IMPLANT
GLOVE BIOGEL PI INDICATOR 6.5 (GLOVE) ×2
GLOVE SS BIOGEL STRL SZ 7 (GLOVE) ×1 IMPLANT
GLOVE SUPERSENSE BIOGEL SZ 7 (GLOVE) ×2
GLOVE SURG SS PI 7.0 STRL IVOR (GLOVE) ×3 IMPLANT
GOWN STRL REUS W/ TWL LRG LVL3 (GOWN DISPOSABLE) ×1 IMPLANT
GOWN STRL REUS W/ TWL XL LVL3 (GOWN DISPOSABLE) ×1 IMPLANT
GOWN STRL REUS W/TWL LRG LVL3 (GOWN DISPOSABLE) ×2
GOWN STRL REUS W/TWL XL LVL3 (GOWN DISPOSABLE) ×2
KIT BASIN OR (CUSTOM PROCEDURE TRAY) ×3 IMPLANT
KIT ROOM TURNOVER OR (KITS) ×3 IMPLANT
LIQUID BAND (GAUZE/BANDAGES/DRESSINGS) ×3 IMPLANT
NEEDLE 18GX1X1/2 (RX/OR ONLY) (NEEDLE) ×3 IMPLANT
NEEDLE 22X1 1/2 (OR ONLY) (NEEDLE) ×6 IMPLANT
NEEDLE HYPO 25GX1X1/2 BEV (NEEDLE) ×3 IMPLANT
NS IRRIG 1000ML POUR BTL (IV SOLUTION) ×3 IMPLANT
PACK SURGICAL SETUP 50X90 (CUSTOM PROCEDURE TRAY) ×3 IMPLANT
PAD ARMBOARD 7.5X6 YLW CONV (MISCELLANEOUS) ×6 IMPLANT
SOAP 2 % CHG 4 OZ (WOUND CARE) ×3 IMPLANT
SPONGE GAUZE 2X2 STER 10/PKG (GAUZE/BANDAGES/DRESSINGS) ×2
SUT ETHILON 3 0 PS 1 (SUTURE) ×3 IMPLANT
SUT VICRYL 4-0 PS2 18IN ABS (SUTURE) ×3 IMPLANT
SYR 20CC LL (SYRINGE) IMPLANT
SYR 5ML LL (SYRINGE) ×9 IMPLANT
SYR CONTROL 10ML LL (SYRINGE) ×3 IMPLANT
SYRINGE 10CC LL (SYRINGE) IMPLANT
TAPE CLOTH SURG 4X10 WHT LF (GAUZE/BANDAGES/DRESSINGS) ×3 IMPLANT
WATER STERILE IRR 1000ML POUR (IV SOLUTION) IMPLANT

## 2014-01-30 NOTE — Progress Notes (Signed)
30 mins into first HD tx system clotted. Venous resistance and pressure. Pt no c/o, resting, vss. MD notified.

## 2014-01-30 NOTE — Progress Notes (Signed)
INITIAL NUTRITION ASSESSMENT  Pt meets criteria for SEVERE MALNUTRITION in the context of acute illness/injury as evidenced by a 12% weight loss in 2 weeks and energy intake of </= 50% for >/= 5 days.  DOCUMENTATION CODES Per approved criteria  -Severe malnutrition in the context of acute illness or injury   INTERVENTION: Provide butter pecan Nepro Shake po BID, each supplement provides 425 kcal and 19 grams protein.  Encourage adequate PO intake.  NUTRITION DIAGNOSIS: Increased nutrient needs related to AKI as evidenced by estimated nutrition needs.   Goal: Pt to meet >/= 90% of their estimated nutrition needs   Monitor:  Diet advancement, weight trends, labs, I/O's  Reason for Assessment: MD consult for assessment of nutrition requirements/status  79 y.o. female  Admitting Dx: AKI (acute kidney injury)  ASSESSMENT: Pt presenting as a transfer from her nursing home, At her facility she has had ongoing generalized weakness, fatigue, poor tolerance to physical exertion. Her son who is present at bedside reports that she "picks at her food." She also complains of intermittent abdominal pain, had episode of nausea and vomiting yesterday.  Procedure: (12/16): #1 bilateral ultrasound localization internal jugular veins #2 insertion of tunneled hemodialysis catheter via right IJ-diateck -19 cm  Son at pt bedside. He reports pt has had a decreased appetite over the past 2 weeks since discharge. He reports pt has still been eating 3 meals a day, however meal completion has been around 50%. Pt has been drinking oral supplements at home (Ensure), but he reports it would take 3 days for her to finish one bottle. Son reports he has been looking for Nepro, but is having a difficult time. Son was educated on where he can obtain Nepro. Son reports pt has been losing weight. Noted pt with a 12% weight loss in 2 weeks. RD to order pt Nepro.. Meal completion today has been 90%. Pt was encouraged to  eat her food at meals and to drink her supplements.   Pt with no observed significant fat or muscle mass loss.   Labs: Low potassium, CO2, total bilirubin, GFR. High BUN, creatinine, and phosphorous (7.6).  Height: Ht Readings from Last 1 Encounters:  01/29/14 5\' 8"  (1.727 m)    Weight: Wt Readings from Last 1 Encounters:  01/29/14 184 lb 8.4 oz (83.7 kg)    Ideal Body Weight: 140 lbs  % Ideal Body Weight: 131%  Wt Readings from Last 10 Encounters:  01/29/14 184 lb 8.4 oz (83.7 kg)  01/20/14 192 lb 0.3 oz (87.1 kg)  12/01  210 lbs  Usual Body Weight: 210 lbs  % Usual Body Weight: 88%  BMI:  Body mass index is 28.06 kg/(m^2).  Estimated Nutritional Needs: Kcal: 1850-2100 Protein: 95-110 grams Fluid: Per MD  Skin: +1 LE edema  Diet Order: Diet NPO time specified  EDUCATION NEEDS: -Education needs identified   Intake/Output Summary (Last 24 hours) at 01/30/14 1004 Last data filed at 01/30/14 0559  Gross per 24 hour  Intake    300 ml  Output    150 ml  Net    150 ml    Last BM: 12/14  Labs:   Recent Labs Lab 01/29/14 0810 01/29/14 0815 01/29/14 1355 01/30/14 0553  NA 140 142  --  141  K 3.8 3.6*  --  3.6*  CL 104 111  --  106  CO2 17*  --   --  17*  BUN 61* 60*  --  61*  CREATININE 7.94* 9.10*  8.08* 7.64*  CALCIUM 9.5  --   --  9.0  PHOS  --   --   --  7.6*  GLUCOSE 123* 130*  --  134*    CBG (last 3)  No results for input(s): GLUCAP in the last 72 hours.  Scheduled Meds: . aspirin EC  81 mg Oral Daily  . latanoprost  1 drop Both Eyes QHS  . levothyroxine  25 mcg Oral QAC breakfast  . loratadine  10 mg Oral Daily  . mometasone-formoterol  2 puff Inhalation BID  . pilocarpine  1 drop Right Eye QID  . senna-docusate  1 tablet Oral BID  . sodium chloride  3 mL Intravenous Q12H  . sodium chloride  3 mL Intravenous Q12H    Continuous Infusions:   Past Medical History  Diagnosis Date  . Hypertension   . Hypothyroidism   . Seasonal  allergies   . Glaucoma   . COPD (chronic obstructive pulmonary disease)   . Former smoker   . Renal disorder     History reviewed. No pertinent past surgical history.  Marijean NiemannStephanie La, MS, RD, LDN Pager # [phone] After hours/ weekend pager # [phone]

## 2014-01-30 NOTE — Progress Notes (Signed)
UR Completed.  Vangie BickerBrown, Telena Peyser Jane [phone] 01/30/2014

## 2014-01-30 NOTE — Consult Note (Signed)
Asked by Dr Deterding to place diatek catheter today.  Will make pt NPO.  Dr Hart RochesterLawson to review later today.   Fabienne Brunsharles Fields, MD [facility] Office: [phone] Pager: [phone]

## 2014-01-30 NOTE — Op Note (Signed)
OPERATIVE REPORT  Date of Surgery: 01/29/2014 - 01/30/2014  Surgeon: Josephina GipJames Lawson, MD  Assistant: Nurse  Pre-op Diagnosis: END STAGE RENAL DISEASE  Post-op Diagnosis: END STAGE RENAL DISEASE  Procedure: Procedure(s): #1 bilateral ultrasound localization internal jugular veins #2 insertion of tunneled hemodialysis catheter via right IJ-diateck -19 cm  Anesthesia: Mac  EBL: Minimal  Complications: None  Procedure Details: The patient was taken the operating room placed in the Trendelenburg position. Both internal jugular veins were imaged using the mode ultrasound-sono site. There were both noted to be widely patent right larger than left. After prepping and draping in routine sterile manner the right IJ was entered using a supraclavicular approach guidewire passed into the right atrium under fluoroscopic guidance. After dilating the track appropriately a 19 cm tunneled hemodialysis catheter was positioned in the right atrium tunneled peripherally secured with nylon sutures and the wound closed with Vicryl in a subcuticular fashion with Dermabond patient taken to recovery room in stable condition for chest x-ray   Josephina GipJames Lawson, MD 01/30/2014 1:12 PM

## 2014-01-30 NOTE — Progress Notes (Signed)
MD ordered cathflo to dwell for 2 hours. Will return patient for HD tx after that.

## 2014-01-30 NOTE — Progress Notes (Signed)
Subjective: Interval History: has complaints still does not feel good.  Objective: Vital signs in last 24 hours: Temp:  [97.4 F (36.3 C)-99 F (37.2 C)] 98.6 F (37 C) (12/16 0548) Pulse Rate:  [39-128] 109 (12/16 0548) Resp:  [18-29] 18 (12/16 0548) BP: (102-157)/(51-80) 111/55 mmHg (12/16 0548) SpO2:  [89 %-100 %] 99 % (12/16 0548) Weight:  [83.7 kg (184 lb 8.4 oz)-93.895 kg (207 lb)] 83.7 kg (184 lb 8.4 oz) (12/15 2102) Weight change:   Intake/Output from previous day: 12/15 0701 - 12/16 0700 In: 300 [P.O.:300] Out: 400 [Urine:400] Intake/Output this shift:    General appearance: alert, cooperative, moderately obese and pale Resp: diminished breath sounds bilaterally, rales bibasilar and wheezes bibasilar Cardio: S1, S2 normal and systolic murmur: holosystolic 2/6, blowing at apex GI: pos bs, obese, liver down 6 cm Extremities: extremities normal, atraumatic, no cyanosis or edema  Lab Results:  Recent Labs  01/29/14 1355 01/30/14 0553  WBC 9.5 8.6  HGB 9.3* 9.8*  HCT 29.6* 31.5*  PLT 303 270   BMET:  Recent Labs  01/29/14 0810 01/29/14 0815 01/29/14 1355 01/30/14 0553  NA 140 142  --  141  K 3.8 3.6*  --  3.6*  CL 104 111  --  106  CO2 17*  --   --  17*  GLUCOSE 123* 130*  --  134*  BUN 61* 60*  --  61*  CREATININE 7.94* 9.10* 8.08* 7.64*  CALCIUM 9.5  --   --  9.0   No results for input(s): PTH in the last 72 hours. Iron Studies: No results for input(s): IRON, TIBC, TRANSFERRIN, FERRITIN in the last 72 hours.  Studies/Results: [place] Abdomen Complete  01/29/2014   CLINICAL DATA:  Abdominal pain, medical renal disease, hypertension  EXAM: ULTRASOUND ABDOMEN COMPLETE  COMPARISON:  Renal ultrasound of January 08, 2014  FINDINGS: Gallbladder: The gallbladder is adequately distended and contains multiple echogenic mobile shadowing stones. There is no gallbladder wall thickening, pericholecystic fluid, or positive sonographic Murphy's sign.  Common bile duct:  Diameter: 6.5 mm  Liver: No focal lesion identified. Within normal limits in parenchymal echogenicity.  IVC: No abnormality visualized.  Pancreas: Largely obscured by bowel gas.  Spleen: Size and appearance within normal limits.  Right Kidney: Length: 13.8 cm. Echogenicity of the cortex is mildly increased. No mass or hydronephrosis visualized. There is a midpole cyst measuring 1.2 cm in diameter.  Left Kidney: Length: 12.5 cm. Echogenicity of the cortex is mildly increased. No mass or hydronephrosis visualized.  Abdominal aorta: No aneurysm visualized.  Other findings: None.  IMPRESSION: 1. Gallstones without evidence of acute cholecystitis. 2. Increased renal cortical echotexture consistent with medical renal disease. This is similar to that seen previously.   Electronically Signed   By: David  [place]   On: 01/29/2014 13:26   Dg Chest Port 1 View  01/29/2014   CLINICAL DATA:  New onset of nausea and shortness of Breath  EXAM: PORTABLE CHEST - 1 VIEW  COMPARISON:  01/09/2014  FINDINGS: Cardiomegaly again noted. Atherosclerotic calcifications of thoracic aorta. No acute infiltrate or pulmonary edema. Mild basilar atelectasis.  IMPRESSION: No active disease.  Mild basilar atelectasis.   Electronically Signed   By: Natasha MeadLiviu  Pop M.D.   On: 01/29/2014 08:38    I have reviewed the patient's current medications.  Assessment/Plan: 1 AKI ? I&O. No improvement.  Has mild acidemia, uremia, vol ok. Will do HD and get PC.  Eosinophilia only hint of cause.  ?AIN, not  great candidate for empiric steroids so will anticipate bx on  Fri 2 Anemia stable 3 Obesity 4 COPD 5 GERD P PC, HD, coags, antic bx.  Counsel    LOS: 1 day   Celica Kotowski L 01/30/2014,7:38 AM

## 2014-01-30 NOTE — Progress Notes (Signed)
Cath flo inserted in both catheter ports. Report called to 6E primary RN. Will return pt to room for 2 hours and then attempt HD again.

## 2014-01-30 NOTE — Progress Notes (Signed)
PATIENT DETAILS Name: Daymon Larsenellie Kerins Age: 79 y.o. Sex: female Date of Birth: 1934/06/27 Admit Date: 01/29/2014 Admitting Physician Benny LennertJoseph L Zammit, MD PCP:No primary care provider on file.  Subjective: Better, no further nausea or vomiting. No abd pain today  Assessment/Plan: Principal Problem:  Acute renal failure: Etiology not clear, recently admitted to this hospital and underwent hemodialysis 2, by discharge creatinine was improving, hemodialysis catheter was removed and patient subsequently discharged to SNF. Returned on 12/15 with worsening nausea, vomiting, diffuse abdominal pain. Further worsening in creatinine. If allergy consulted, plans are to place hemodialysis catheter and potentially restart hemodialysis. Etiology of renal failure not evident, but patient does have some eosinophilia, concern for AIN/showed rales-nephrology planning renal biopsy in the next 2 days.  Active Problems:   Nausea vomiting and abdominal pain: Abdomen is soft, nausea vomiting has resolved with supportive care. Likely secondary to above. Abdominal ultrasound shows gallstones, however abdominal pain is diffuse and not likely related to cholelithiasis. Follow clinically.    Hypothyroidism: Continue with levothyroxine    GERD:resume PPI    COPD: Lungs clear, and tinea nebulized bronchodilators.    History of hypertension: Previously on antihypertensives, currently blood pressure controlled without the use of any antihypertensives. Suspect will not need antihypertensives post-if patient is going to be on dialysis.    Failure to thrive syndrome: Recurrent hospitalization, worsening renal failure. Obtain PT/nutrition eval. Follow clinically.  Disposition: Remain inpatient  Antibiotics: None  Anti-infectives    None      DVT Prophylaxis: SCD's for now-resume pharmacologic agents once procedures are complete  Code Status: Full code  Family Communication Son-Keith at  bedside  Procedures:  nONE  CONSULTS:  nephrology and vascular surgery  Time spent 40 minutes-which includes 50% of the time with face-to-face with patient/ family and coordinating care related to the above assessment and plan.  MEDICATIONS: Scheduled Meds: . aspirin EC  81 mg Oral Daily  . latanoprost  1 drop Both Eyes QHS  . levothyroxine  25 mcg Oral QAC breakfast  . loratadine  10 mg Oral Daily  . mometasone-formoterol  2 puff Inhalation BID  . pilocarpine  1 drop Right Eye QID  . senna-docusate  1 tablet Oral BID  . sodium chloride  3 mL Intravenous Q12H  . sodium chloride  3 mL Intravenous Q12H   Continuous Infusions:  PRN Meds:.sodium chloride, acetaminophen **OR** acetaminophen, albuterol, ondansetron **OR** ondansetron (ZOFRAN) IV, sodium chloride    PHYSICAL EXAM: Vital signs in last 24 hours: Filed Vitals:   01/29/14 1900 01/29/14 1953 01/29/14 2102 01/30/14 0548  BP:   102/51 111/55  Pulse:  106 103 109  Temp:   98.9 F (37.2 C) 98.6 F (37 C)  TempSrc:   Oral Oral  Resp:  20 18 18   Height:      Weight:   83.7 kg (184 lb 8.4 oz)   SpO2: 99% 99% 99% 99%    Weight change:  Filed Weights   01/29/14 0809 01/29/14 1001 01/29/14 2102  Weight: 93.895 kg (207 lb) 83.7 kg (184 lb 8.4 oz) 83.7 kg (184 lb 8.4 oz)   Body mass index is 28.06 kg/(m^2).   Gen Exam: Awake and alert with clear speech.   Neck: Supple, No JVD.   Chest: B/L Clear.   CVS: S1 S2 Regular, no murmurs. Abdomen: soft, BS +, non tender, non distended.  Extremities: no edema, lower extremities warm to touch. Neurologic: Non Focal.   Skin: No  Rash.   Wounds: N/A.    Intake/Output from previous day:  Intake/Output Summary (Last 24 hours) at 01/30/14 0959 Last data filed at 01/30/14 0559  Gross per 24 hour  Intake    300 ml  Output    150 ml  Net    150 ml     LAB RESULTS: CBC  Recent Labs Lab 01/29/14 0810 01/29/14 0815 01/29/14 1355 01/30/14 0553  WBC 11.5*  --  9.5  8.6  HGB 11.0* 12.6 9.3* 9.8*  HCT 34.4* 37.0 29.6* 31.5*  PLT 303  --  303 270  MCV 90.8  --  90.8 91.3  MCH 29.0  --  28.5 28.4  MCHC 32.0  --  31.4 31.1  RDW 15.8*  --  15.7* 15.9*  LYMPHSABS 2.4  --   --  1.2  MONOABS 1.1*  --   --  1.1*  EOSABS 0.9*  --   --  0.8*  BASOSABS 0.2*  --   --  0.1    Chemistries   Recent Labs Lab 01/29/14 0810 01/29/14 0815 01/29/14 1355 01/30/14 0553  NA 140 142  --  141  K 3.8 3.6*  --  3.6*  CL 104 111  --  106  CO2 17*  --   --  17*  GLUCOSE 123* 130*  --  134*  BUN 61* 60*  --  61*  CREATININE 7.94* 9.10* 8.08* 7.64*  CALCIUM 9.5  --   --  9.0    CBG: No results for input(s): GLUCAP in the last 168 hours.  GFR Estimated Creatinine Clearance: 7 mL/min (by C-G formula based on Cr of 7.64).  Coagulation profile  Recent Labs Lab 01/30/14 0921  INR 1.13    Cardiac Enzymes No results for input(s): CKMB, TROPONINI, MYOGLOBIN in the last 168 hours.  Invalid input(s): CK  Invalid input(s): POCBNP No results for input(s): DDIMER in the last 72 hours. No results for input(s): HGBA1C in the last 72 hours. No results for input(s): CHOL, HDL, LDLCALC, TRIG, CHOLHDL, LDLDIRECT in the last 72 hours. No results for input(s): TSH, T4TOTAL, T3FREE, THYROIDAB in the last 72 hours.  Invalid input(s): FREET3 No results for input(s): VITAMINB12, FOLATE, FERRITIN, TIBC, IRON, RETICCTPCT in the last 72 hours.  Recent Labs  01/29/14 1355  LIPASE 42    Urine Studies No results for input(s): UHGB, CRYS in the last 72 hours.  Invalid input(s): UACOL, UAPR, USPG, UPH, UTP, UGL, UKET, UBIL, UNIT, UROB, ULEU, UEPI, UWBC, URBC, UBAC, CAST, UCOM, BILUA  MICROBIOLOGY: No results found for this or any previous visit (from the past 240 hour(s)).  RADIOLOGY STUDIES/RESULTS: [place] Abdomen Complete  01/29/2014   CLINICAL DATA:  Abdominal pain, medical renal disease, hypertension  EXAM: ULTRASOUND ABDOMEN COMPLETE  COMPARISON:  Renal ultrasound  of January 08, 2014  FINDINGS: Gallbladder: The gallbladder is adequately distended and contains multiple echogenic mobile shadowing stones. There is no gallbladder wall thickening, pericholecystic fluid, or positive sonographic Murphy's sign.  Common bile duct: Diameter: 6.5 mm  Liver: No focal lesion identified. Within normal limits in parenchymal echogenicity.  IVC: No abnormality visualized.  Pancreas: Largely obscured by bowel gas.  Spleen: Size and appearance within normal limits.  Right Kidney: Length: 13.8 cm. Echogenicity of the cortex is mildly increased. No mass or hydronephrosis visualized. There is a midpole cyst measuring 1.2 cm in diameter.  Left Kidney: Length: 12.5 cm. Echogenicity of the cortex is mildly increased. No mass or hydronephrosis visualized.  Abdominal  aorta: No aneurysm visualized.  Other findings: None.  IMPRESSION: 1. Gallstones without evidence of acute cholecystitis. 2. Increased renal cortical echotexture consistent with medical renal disease. This is similar to that seen previously.   Electronically Signed   By: David  [place]   On: 01/29/2014 13:26   US Renal  01/08/2014   CLINICAL DATA:  Acute renal failure  EXAM: RENAL/URINARY TRACT ULTRASOUND COMPLETE  COMPARISON:  None.  FINDINGS: Right Kidney:  Length: 15 cm. Increased renal cortical echogenicity. No mass or hydronephrosis visualized.  Left Kidney:  Length: 12.3 cm. Increased renal cortical echogenicity. No mass or hydronephrosis visualized.  Bladder:  Appears normal for degree of bladder distention.  IMPRESSION: 1. No obstructive uropathy. 2. Increased renal cortical echogenicity as can be seen with medical renal disease.   Electronically Signed   By: Elige Ko   On: 01/08/2014 21:15   Dg Chest Port 1 View  01/29/2014   CLINICAL DATA:  New onset of nausea and shortness of Breath  EXAM: PORTABLE CHEST - 1 VIEW  COMPARISON:  01/09/2014  FINDINGS: Cardiomegaly again noted. Atherosclerotic calcifications of thoracic  aorta. No acute infiltrate or pulmonary edema. Mild basilar atelectasis.  IMPRESSION: No active disease.  Mild basilar atelectasis.   Electronically Signed   By: Natasha Mead M.D.   On: 01/29/2014 08:38   Dg Chest Port 1 View  01/09/2014   CLINICAL DATA:  Hypoxia  EXAM: PORTABLE CHEST - 1 VIEW  COMPARISON:  01/08/2014  FINDINGS: Cardiomediastinal silhouette is stable. Mild elevation of the left hemidiaphragm. There is central mild vascular congestion and mild interstitial prominence suspicious for mild interstitial edema. Small left pleural effusion with left basilar atelectasis or infiltrate. Right IJ sheath is unchanged in position.  IMPRESSION: Mild vascular congestion mild interstitial prominence suspicious for mild interstitial edema. Small left pleural effusion with left basilar atelectasis or infiltrate.   Electronically Signed   By: Natasha Mead M.D.   On: 01/09/2014 10:45   Dg Chest Port 1 View  01/08/2014   CLINICAL DATA:  Evaluate central line placement.  EXAM: PORTABLE CHEST - 1 VIEW  COMPARISON:  01/08/2014  FINDINGS: There is a right IJ catheter with tip in the projection of the SVC. No pneumothorax identified. Mild cardiac enlargement. There is calcified plaque identified within the aortic arch. Pulmonary vascular congestion is noted. There is bibasilar atelectasis.  IMPRESSION: 1. Tip of the IJ catheter is in the SVC.  No pneumothorax. 2. Cardiac enlargement, atherosclerotic disease and pulmonary vascular congestion.   Electronically Signed   By: Signa Kell M.D.   On: 01/08/2014 23:52   Dg Abd Portable 1v  01/13/2014   CLINICAL DATA:  Generalized abdominal pain, nausea.  EXAM: PORTABLE ABDOMEN - 1 VIEW  COMPARISON:  January 08, 2014.  FINDINGS: The bowel gas pattern is normal. No radio-opaque calculi or other significant radiographic abnormality are seen.  IMPRESSION: No evidence of bowel obstruction or ileus.   Electronically Signed   By: Roque Lias M.D.   On: 01/13/2014 18:18     Jeoffrey Massed, MD  [facility] Pager:[phone]  If 7PM-7AM, please contact night-coverage [email or web address] Password [email or web address] 01/30/2014, 9:59 AM   LOS: 1 day

## 2014-01-30 NOTE — Transfer of Care (Signed)
Immediate Anesthesia Transfer of Care Note  Patient: [name]  Procedure(s) Performed: Procedure(s): INSERTION OF DIALYSIS CATHETER RIGHT INTERNAL JUGULAR (Right)  Patient Location: PACU  Anesthesia Type:MAC  Level of Consciousness: awake, patient cooperative and responds to stimulation  Airway & Oxygen Therapy: Patient Spontanous Breathing and Patient connected to face mask oxygen  Post-op Assessment: Report given to PACU RN and Post -op Vital signs reviewed and stable  Post vital signs: Reviewed and stable  Complications: No apparent anesthesia complications

## 2014-01-30 NOTE — Progress Notes (Signed)
Hemodialysis goal of 2.5 liters not met due to symptomatic hypotension. 1.1 liters removed during treatment. Ultrafiltration was discontinued along with administration of normal saline to resolve hypotension. Patient also experienced mild cramping post treatment. Post hemodialysis treatment blood pressure is now 98/41 without symptoms. Baltazar Najjar PA was also contacted regarding PVC's/ sinus tachycardia. Magnesium was drawn which was normal. Potassium addressed with 4k dialysate bath. Previous notes reviewed which showed similar arhythmias.

## 2014-01-30 NOTE — Anesthesia Preprocedure Evaluation (Signed)
Anesthesia Evaluation  Patient identified by MRN, date of birth, ID band Patient awake    Reviewed: Allergy & Precautions, H&P , NPO status , Patient's Chart, lab work & pertinent test results  Airway        Dental   Pulmonary COPDformer smoker,          Cardiovascular hypertension,     Neuro/Psych    GI/Hepatic GERD-  ,  Endo/Other  Hypothyroidism   Renal/GU ESRF, Dialysis and CRFRenal disease     Musculoskeletal   Abdominal   Peds  Hematology   Anesthesia Other Findings   Reproductive/Obstetrics                             Anesthesia Physical Anesthesia Plan  ASA: III  Anesthesia Plan: MAC   Post-op Pain Management:    Induction: Intravenous  Airway Management Planned:   Additional Equipment:   Intra-op Plan:   Post-operative Plan:   Informed Consent: I have reviewed the patients History and Physical, chart, labs and discussed the procedure including the risks, benefits and alternatives for the proposed anesthesia with the patient or authorized representative who has indicated his/her understanding and acceptance.     Plan Discussed with: CRNA, Anesthesiologist and Surgeon  Anesthesia Plan Comments:         Anesthesia Quick Evaluation

## 2014-01-30 NOTE — Clinical Social Work Note (Signed)
Patient from [facility] skilled nursing facility. CSW will follow-up with patient/family to determine d/c plan.  Genelle BalVanessa Karigan Cloninger, MSW, LCSW Clinical Social Work Department [facility] [phone]

## 2014-01-30 NOTE — Procedures (Signed)
I was present at this session.  I have reviewed the session itself and made appropriate changes.  HD via new RIJ PC.  bp stable. To Use low flows.  Carlyn Lemke L 12/16/20153:02 PM

## 2014-01-30 NOTE — Anesthesia Postprocedure Evaluation (Signed)
  Anesthesia Post-op Note  Patient: [name]  Procedure(s) Performed: Procedure(s): INSERTION OF DIALYSIS CATHETER RIGHT INTERNAL JUGULAR (Right)  Patient Location: PACU  Anesthesia Type:MAC  Level of Consciousness: awake, alert , oriented and patient cooperative  Airway and Oxygen Therapy: Patient Spontanous Breathing  Post-op Pain: none  Post-op Assessment: Post-op Vital signs reviewed, Patient's Cardiovascular Status Stable, Respiratory Function Stable, Patent Airway, No signs of Nausea or vomiting and Pain level controlled  Post-op Vital Signs: stable  Last Vitals:  Filed Vitals:   01/30/14 1000  BP: 111/67  Pulse: 88  Temp: 36.7 C  Resp: 18    Complications: No apparent anesthesia complications

## 2014-01-30 NOTE — Consult Note (Signed)
Vascular Surgery Consultation  Reason for Consult: patient needs hemodialysis catheter  HPI: [name] is a 79 y.o. female who presents for evaluation of need for hemodialysis catheter   Past Medical History  Diagnosis Date  . Hypertension   . Hypothyroidism   . Seasonal allergies   . Glaucoma   . COPD (chronic obstructive pulmonary disease)   . Former smoker   . Renal disorder    History reviewed. No pertinent past surgical history. History   Social History  . Marital Status: Widowed    Spouse Name: N/A    Number of Children: N/A  . Years of Education: N/A   Social History Main Topics  . Smoking status: Former Games developermoker  . Smokeless tobacco: None  . Alcohol Use: None  . Drug Use: None  . Sexual Activity: Not Currently   Other Topics Concern  . None   Social History Narrative   Worked in [employer]   History reviewed. No pertinent family history. Allergies  Allergen Reactions  . Ciprofloxacin Nausea And Vomiting   Prior to Admission medications   Medication Sig Start Date End Date Taking? Authorizing Provider  albuterol (PROVENTIL) (2.5 MG/3ML) 0.083% nebulizer solution Take 2.5 mg by nebulization every 4 (four) hours as needed for wheezing or shortness of breath.   Yes Historical Provider, MD  ascorbic acid (VITAMIN C) 1000 MG tablet Take 1,000 mg by mouth 3 (three) times daily.   Yes Historical Provider, MD  aspirin EC 81 MG tablet Take 81 mg by mouth daily.   Yes Historical Provider, MD  azelastine (ASTELIN) 0.1 % nasal spray Place 1 spray into both nostrils daily. Use in each nostril as directed   Yes Historical Provider, MD  Brinzolamide-Brimonidine 1-0.2 % SUSP Place 1 drop into both eyes 2 (two) times daily.   Yes Historical Provider, MD  cholecalciferol (VITAMIN D) 1000 UNITS tablet Take 1,000 Units by mouth daily.   Yes Historical Provider, MD  Fluticasone-Salmeterol (ADVAIR DISKUS) 250-50 MCG/DOSE AEPB Inhale 1 puff into the lungs 2 (two) times daily.    Yes Historical Provider, MD  folic acid (FOLVITE) 1 MG tablet Take 1 tablet (1 mg total) by mouth daily. 01/21/14  Yes Maryann Mikhail, DO  Garlic 1000 MG CAPS Take 1 capsule by mouth daily.   Yes Historical Provider, MD  latanoprost (XALATAN) 0.005 % ophthalmic solution Place 1 drop into both eyes at bedtime.   Yes Historical Provider, MD  levothyroxine (SYNTHROID, LEVOTHROID) 25 MCG tablet Take 25 mcg by mouth daily before breakfast.   Yes Historical Provider, MD  loratadine (CLARITIN) 10 MG tablet Take 10 mg by mouth daily.   Yes Historical Provider, MD  LUTEIN-ZEAXANTHIN PO Take 1 tablet by mouth daily.   Yes Historical Provider, MD  Multiple Vitamins-Minerals (MULTIVITAMIN WITH MINERALS) tablet Take 1 tablet by mouth daily.   Yes Historical Provider, MD  Omega-3 Fatty Acids (FISH OIL) 1000 MG CAPS Take 1 capsule by mouth daily.   Yes Historical Provider, MD  omeprazole (PRILOSEC) 40 MG capsule Take 40 mg by mouth daily.   Yes Historical Provider, MD  pilocarpine (PILOCAR) 1 % ophthalmic solution Place 1 drop into the right eye 4 (four) times daily.   Yes Historical Provider, MD  PROAIR HFA 108 (90 BASE) MCG/ACT inhaler Inhale 2 puffs into the lungs every 6 (six) hours as needed for wheezing or shortness of breath.  11/20/13  Yes Historical Provider, MD  senna-docusate (SENOKOT-S) 8.6-50 MG per tablet Take 1 tablet by mouth 2 (  two) times daily. 01/21/14  Yes Maryann Mikhail, DO  thiamine 100 MG tablet Take 1 tablet (100 mg total) by mouth daily. 01/21/14  Yes Maryann Mikhail, DO     Positive ROS:   All other systems have been reviewed and were otherwise negative with the exception of those mentioned in the HPI and as above.  Physical Exam: Filed Vitals:   01/30/14 1000  BP: 111/67  Pulse: 88  Temp: 98 F (36.7 C)  Resp: 18    General: Alert, no acute distress HEENT: Normal for age Cardiovascular: Regular rate and rhythm. Carotid pulses 2+, no bruits audible Respiratory: Clear to  auscultation. No cyanosis, no use of accessory musculature GI: No organomegaly, abdomen is soft and non-tender Skin: No lesions in the area of chief complaint Neurologic: Sensation intact distally Psychiatric: Patient is competent for consent with normal mood and affect Musculoskeletal: No obvious deformi   Assessment/Plan:  Patient needs hemodialysis and have been consulted to insert tunneled hemodialysis catheter Will proceed today with that procedure   Josephina GipJames Lawson, MD 01/30/2014 12:01 PM

## 2014-01-31 DIAGNOSIS — K219 Gastro-esophageal reflux disease without esophagitis: Secondary | ICD-10-CM

## 2014-01-31 DIAGNOSIS — E039 Hypothyroidism, unspecified: Secondary | ICD-10-CM

## 2014-01-31 LAB — CBC
HCT: 32.5 % — ABNORMAL LOW (ref 36.0–46.0)
HEMOGLOBIN: 10.2 g/dL — AB (ref 12.0–15.0)
MCH: 29.1 pg (ref 26.0–34.0)
MCHC: 31.4 g/dL (ref 30.0–36.0)
MCV: 92.9 fL (ref 78.0–100.0)
PLATELETS: 161 10*3/uL (ref 150–400)
RBC: 3.5 MIL/uL — AB (ref 3.87–5.11)
RDW: 15.9 % — ABNORMAL HIGH (ref 11.5–15.5)
WBC: 10 10*3/uL (ref 4.0–10.5)

## 2014-01-31 LAB — RENAL FUNCTION PANEL
ANION GAP: 14 (ref 5–15)
Albumin: 2.6 g/dL — ABNORMAL LOW (ref 3.5–5.2)
BUN: 24 mg/dL — ABNORMAL HIGH (ref 6–23)
CALCIUM: 8.5 mg/dL (ref 8.4–10.5)
CO2: 25 mEq/L (ref 19–32)
Chloride: 102 mEq/L (ref 96–112)
Creatinine, Ser: 4.04 mg/dL — ABNORMAL HIGH (ref 0.50–1.10)
GFR, EST AFRICAN AMERICAN: 11 mL/min — AB (ref >90)
GFR, EST NON AFRICAN AMERICAN: 10 mL/min — AB (ref >90)
GLUCOSE: 119 mg/dL — AB (ref 70–99)
POTASSIUM: 3.5 meq/L — AB (ref 3.7–5.3)
Phosphorus: 4.6 mg/dL (ref 2.3–4.6)
SODIUM: 141 meq/L (ref 137–147)

## 2014-01-31 MED ORDER — SODIUM CHLORIDE 0.9 % IV SOLN
INTRAVENOUS | Status: DC
  Administered 2014-02-01: 10:00:00 via INTRAVENOUS

## 2014-01-31 MED ORDER — LORAZEPAM 1 MG PO TABS
2.0000 mg | ORAL_TABLET | Freq: Once | ORAL | Status: AC
  Administered 2014-02-01: 2 mg via ORAL
  Filled 2014-01-31: qty 2

## 2014-01-31 NOTE — Progress Notes (Signed)
PATIENT DETAILS Name: [name] Age: 79 y.o. Sex: female Date of Birth: 1934/06/18 Admit Date: 01/29/2014 Admitting Physician Benny Lennert, MD PCP:No primary care provider on file.  Subjective:  No abd pain today-but some nausea  Assessment/Plan: Principal Problem:  Acute renal failure: Etiology not clear, recently admitted to this hospital and underwent hemodialysis 2, by discharge creatinine was improving, hemodialysis catheter was removed and patient subsequently discharged to SNF. Returned on 12/15 with worsening nausea, vomiting, diffuse abdominal pain. Further worsening in creatinine. Renal/VVS consulted,  hemodialysis catheter placed  and underwent hemodialysis on 12/16. Etiology of renal failure not evident, but patient does have some eosinophilia, concern for AIN/showed rales-nephrology planning renal biopsy in am.  Active Problems:   Nausea vomiting and abdominal pain: No further abd pain, no vomiting, but some nausea. Continue Supportive care.Likely secondary to above. Abdominal ultrasound shows gallstones, however abdominal pain is diffuse and not likely related to cholelithiasis.Lipase levels normal on admission.    Hypothyroidism: Continue with levothyroxine    GERD:continue PPI    COPD: Lungs clear, and tinea nebulized bronchodilators.    History of hypertension: Previously on antihypertensives, currently blood pressure controlled-infact soft use of any antihypertensives. Suspect will need starting Midodrine.Follow BP.    Failure to thrive syndrome: Recurrent hospitalization, worsening renal failure. Obtain PT/nutrition eval. Follow clinically.  Disposition: Remain inpatient  Antibiotics: None  Anti-infectives    None      DVT Prophylaxis: SCD's for now-resume pharmacologic agents once procedures are complete  Code Status: Full code  Family Communication Son-Keith at bedside  Procedures:  nONE  CONSULTS:  nephrology and vascular  surgery  Time spent 40 minutes-which includes 50% of the time with face-to-face with patient/ family and coordinating care related to the above assessment and plan.  MEDICATIONS: Scheduled Meds: . aspirin EC  81 mg Oral Daily  . feeding supplement (NEPRO CARB STEADY)  237 mL Oral BID BM  . latanoprost  1 drop Both Eyes QHS  . levothyroxine  25 mcg Oral QAC breakfast  . loratadine  10 mg Oral Daily  . mometasone-formoterol  2 puff Inhalation BID  . pantoprazole  40 mg Oral Q1200  . pilocarpine  1 drop Right Eye QID  . senna-docusate  1 tablet Oral BID  . sodium chloride  3 mL Intravenous Q12H   Continuous Infusions: . sodium chloride 10 mL/hr at 01/30/14 1200   PRN Meds:.sodium chloride, acetaminophen **OR** acetaminophen, albuterol, ondansetron **OR** ondansetron (ZOFRAN) IV, sodium chloride    PHYSICAL EXAM: Vital signs in last 24 hours: Filed Vitals:   01/30/14 2245 01/31/14 0500 01/31/14 0901 01/31/14 1000  BP: 97/67 113/55  80/40  Pulse: 94 62 72 116  Temp: 98.5 F (36.9 C) 98.4 F (36.9 C)  98.6 F (37 C)  TempSrc: Oral Oral  Oral  Resp: 18 18 18 18   Height:      Weight: 83.3 kg (183 lb 10.3 oz)     SpO2: 94% 97% 96% 98%    Weight change: -9.895 kg (-21 lb 13 oz) Filed Weights   01/30/14 1858 01/30/14 2214 01/30/14 2245  Weight: 84.4 kg (186 lb 1.1 oz) 83.3 kg (183 lb 10.3 oz) 83.3 kg (183 lb 10.3 oz)   Body mass index is 27.93 kg/(m^2).   Gen Exam: Awake and alert with clear speech.   Neck: Supple, No JVD.   Chest: B/L Clear.  No rales CVS:S1S2 Regular, no murmurs. Abdomen: soft, BS +, non tender,  non distended.  Extremities: no edema, lower extremities warm to touch. Neurologic: Non Focal.   Skin: No Rash.   Wounds: N/A.    Intake/Output from previous day:  Intake/Output Summary (Last 24 hours) at 01/31/14 1136 Last data filed at 01/31/14 1025  Gross per 24 hour  Intake    703 ml  Output   1561 ml  Net   -858 ml     LAB  RESULTS: CBC  Recent Labs Lab 01/29/14 0810 01/29/14 0815 01/29/14 1355 01/30/14 0553 01/31/14 0408  WBC 11.5*  --  9.5 8.6 10.0  HGB 11.0* 12.6 9.3* 9.8* 10.2*  HCT 34.4* 37.0 29.6* 31.5* 32.5*  PLT 303  --  303 270 161  MCV 90.8  --  90.8 91.3 92.9  MCH 29.0  --  28.5 28.4 29.1  MCHC 32.0  --  31.4 31.1 31.4  RDW 15.8*  --  15.7* 15.9* 15.9*  LYMPHSABS 2.4  --   --  1.2  --   MONOABS 1.1*  --   --  1.1*  --   EOSABS 0.9*  --   --  0.8*  --   BASOSABS 0.2*  --   --  0.1  --     Chemistries   Recent Labs Lab 01/29/14 0810 01/29/14 0815 01/29/14 1355 01/30/14 0553 01/30/14 1753 01/31/14 0408  NA 140 142  --  141 140 141  K 3.8 3.6*  --  3.6* 3.3* 3.5*  CL 104 111  --  106 103 102  CO2 17*  --   --  17* 21 25  GLUCOSE 123* 130*  --  134* 141* 119*  BUN 61* 60*  --  61* 51* 24*  CREATININE 7.94* 9.10* 8.08* 7.64* 6.57* 4.04*  CALCIUM 9.5  --   --  9.0 8.7 8.5  MG  --   --   --   --  2.2  --     CBG: No results for input(s): GLUCAP in the last 168 hours.  GFR Estimated Creatinine Clearance: 13.2 mL/min (by C-G formula based on Cr of 4.04).  Coagulation profile  Recent Labs Lab 01/30/14 0921  INR 1.13    Cardiac Enzymes No results for input(s): CKMB, TROPONINI, MYOGLOBIN in the last 168 hours.  Invalid input(s): CK  Invalid input(s): POCBNP No results for input(s): DDIMER in the last 72 hours. No results for input(s): HGBA1C in the last 72 hours. No results for input(s): CHOL, HDL, LDLCALC, TRIG, CHOLHDL, LDLDIRECT in the last 72 hours. No results for input(s): TSH, T4TOTAL, T3FREE, THYROIDAB in the last 72 hours.  Invalid input(s): FREET3 No results for input(s): VITAMINB12, FOLATE, FERRITIN, TIBC, IRON, RETICCTPCT in the last 72 hours.  Recent Labs  01/29/14 1355  LIPASE 42    Urine Studies No results for input(s): UHGB, CRYS in the last 72 hours.  Invalid input(s): UACOL, UAPR, USPG, UPH, UTP, UGL, UKET, UBIL, UNIT, UROB, ULEU, UEPI,  UWBC, URBC, UBAC, CAST, UCOM, BILUA  MICROBIOLOGY: No results found for this or any previous visit (from the past 240 hour(s)).  RADIOLOGY STUDIES/RESULTS: US Abdomen Complete  01/29/2014   CLINICAL DATA:  Abdominal pain, medical renal disease, hypertension  EXAM: ULTRASOUND ABDOMEN COMPLETE  COMPARISON:  Renal ultrasound of January 08, 2014  FINDINGS: Gallbladder: The gallbladder is adequately distended and contains multiple echogenic mobile shadowing stones. There is no gallbladder wall thickening, pericholecystic fluid, or positive sonographic Murphy's sign.  Common bile duct: Diameter: 6.5 mm  Liver: No focal  lesion identified. Within normal limits in parenchymal echogenicity.  IVC: No abnormality visualized.  Pancreas: Largely obscured by bowel gas.  Spleen: Size and appearance within normal limits.  Right Kidney: Length: 13.8 cm. Echogenicity of the cortex is mildly increased. No mass or hydronephrosis visualized. There is a midpole cyst measuring 1.2 cm in diameter.  Left Kidney: Length: 12.5 cm. Echogenicity of the cortex is mildly increased. No mass or hydronephrosis visualized.  Abdominal aorta: No aneurysm visualized.  Other findings: None.  IMPRESSION: 1. Gallstones without evidence of acute cholecystitis. 2. Increased renal cortical echotexture consistent with medical renal disease. This is similar to that seen previously.   Electronically Signed   By: David  SwazilandJordan   On: 01/29/2014 13:26   [place] Renal  01/08/2014   CLINICAL DATA:  Acute renal failure  EXAM: RENAL/URINARY TRACT ULTRASOUND COMPLETE  COMPARISON:  None.  FINDINGS: Right Kidney:  Length: 15 cm. Increased renal cortical echogenicity. No mass or hydronephrosis visualized.  Left Kidney:  Length: 12.3 cm. Increased renal cortical echogenicity. No mass or hydronephrosis visualized.  Bladder:  Appears normal for degree of bladder distention.  IMPRESSION: 1. No obstructive uropathy. 2. Increased renal cortical echogenicity as can be seen  with medical renal disease.   Electronically Signed   By: Elige KoHetal  Patel   On: 01/08/2014 21:15   Dg Chest Port 1 View  01/30/2014   CLINICAL DATA:  Dialysis catheter insertion.  EXAM: PORTABLE CHEST - 1 VIEW  COMPARISON:  01/29/2014.  FINDINGS: Trachea is midline. Heart size stable. Right IJ dialysis catheter tips project over the SVC. Biapical pleural thickening. Question developing airspace disease in the left lower lobe. Lungs are otherwise clear. No pneumothorax. No definite pleural fluid.  IMPRESSION: 1. Right IJ dialysis catheter insertion without pneumothorax. 2. Question developing left lower lobe airspace disease.   Electronically Signed   By: Leanna BattlesMelinda  Blietz M.D.   On: 01/30/2014 13:48   Dg Chest Port 1 View  01/29/2014   CLINICAL DATA:  New onset of nausea and shortness of Breath  EXAM: PORTABLE CHEST - 1 VIEW  COMPARISON:  01/09/2014  FINDINGS: Cardiomegaly again noted. Atherosclerotic calcifications of thoracic aorta. No acute infiltrate or pulmonary edema. Mild basilar atelectasis.  IMPRESSION: No active disease.  Mild basilar atelectasis.   Electronically Signed   By: Natasha MeadLiviu  Pop M.D.   On: 01/29/2014 08:38   Dg Chest Port 1 View  01/09/2014   CLINICAL DATA:  Hypoxia  EXAM: PORTABLE CHEST - 1 VIEW  COMPARISON:  01/08/2014  FINDINGS: Cardiomediastinal silhouette is stable. Mild elevation of the left hemidiaphragm. There is central mild vascular congestion and mild interstitial prominence suspicious for mild interstitial edema. Small left pleural effusion with left basilar atelectasis or infiltrate. Right IJ sheath is unchanged in position.  IMPRESSION: Mild vascular congestion mild interstitial prominence suspicious for mild interstitial edema. Small left pleural effusion with left basilar atelectasis or infiltrate.   Electronically Signed   By: Natasha MeadLiviu  Pop M.D.   On: 01/09/2014 10:45   Dg Chest Port 1 View  01/08/2014   CLINICAL DATA:  Evaluate central line placement.  EXAM: PORTABLE  CHEST - 1 VIEW  COMPARISON:  01/08/2014  FINDINGS: There is a right IJ catheter with tip in the projection of the SVC. No pneumothorax identified. Mild cardiac enlargement. There is calcified plaque identified within the aortic arch. Pulmonary vascular congestion is noted. There is bibasilar atelectasis.  IMPRESSION: 1. Tip of the IJ catheter is in the SVC.  No pneumothorax. 2.  Cardiac enlargement, atherosclerotic disease and pulmonary vascular congestion.   Electronically Signed   By: Signa Kellaylor  Stroud M.D.   On: 01/08/2014 23:52   Dg Abd Portable 1v  01/13/2014   CLINICAL DATA:  Generalized abdominal pain, nausea.  EXAM: PORTABLE ABDOMEN - 1 VIEW  COMPARISON:  January 08, 2014.  FINDINGS: The bowel gas pattern is normal. No radio-opaque calculi or other significant radiographic abnormality are seen.  IMPRESSION: No evidence of bowel obstruction or ileus.   Electronically Signed   By: Roque LiasJames  Green M.D.   On: 01/13/2014 18:18   Dg Fluoro Guide Cv Line-no Report  01/30/2014   CLINICAL DATA:    FLOURO GUIDE CV LINE  Fluoroscopy was utilized by the requesting physician.  No radiographic  interpretation.     Jeoffrey MassedGHIMIRE,Mckade Gurka, MD  [facility] Pager:[phone]  If 7PM-7AM, please contact night-coverage [email or web address] Password [email or web address] 01/31/2014, 11:36 AM   LOS: 2 days

## 2014-01-31 NOTE — Clinical Social Work Psychosocial (Signed)
   Clinical Social Work Department BRIEF PSYCHOSOCIAL ASSESSMENT 01/31/2014  Patient:  [name],[name]     Account Number:  [id number]     Admit date:  01/29/2014  Clinical Social Worker:  Delmer IslamRAWFORD,Faris Coolman, LCSW  Date/Time:  01/31/2014 03:02 AM  Referred by:  Physician  Date Referred:  01/30/2014 Referred for  SNF Placement   Other Referral:   Interview type:  Family Other interview type:    PSYCHOSOCIAL DATA Living Status:  FAMILY Admitted from facility:   Level of care:   Primary support name:  Dwyane DeeKeith Clover Primary support relationship to patient:  CHILD, ADULT Degree of support available:   Patient lives with her sons Mellody DanceKeith and Loni Dollyhomas Ciampa. Her daughter Cyndie Chimennette Wolff ([phone]) lives in [place]. Patient's children are very carring and supportive of patient.    CURRENT CONCERNS Current Concerns  Post-Acute Placement   Other Concerns:    SOCIAL WORK ASSESSMENT / PLAN CSW talked with son Mellody DanceKeith at the bedside regarding patient's hospitalization and discharge planning. Patient came to hospital from [facility], where she was getting rehab. Mellody DanceKeith indicated that the plan was that patient would be going to [place] to stay with Drinda ButtsAnnette for a few months after discharging from rehab. Mr. Anastasia Fiedleremkey stated that since she is now hospitalized, he is not sure if patient may need to return to rehab before going to [place]. Mr. Anastasia Fiedleremkey provided CSW with his sister's phone number.   Assessment/plan status:  Psychosocial Support/Ongoing Assessment of Needs Other assessment/ plan:   Information/referral to community resources:   None needed or requested at this time.    PATIENT'S/FAMILY'S RESPONSE TO PLAN OF CARE: Son very receptive to speaking with CSW about his mother. He is unsure of the discharge plan at this time. Per Mr. Anastasia Fiedleremkey, It may include returning to SNF before traveling to [place] to be with her daughter.     Genelle BalVanessa Orman Matsumura, MSW, LCSW Licensed Clinical Social  Worker Clinical Social Work Department [facility] [phone]

## 2014-01-31 NOTE — Progress Notes (Signed)
Subjective: Interval History: has complaints some N this am.  Objective: Vital signs in last 24 hours: Temp:  [97.3 F (36.3 C)-98.5 F (36.9 C)] 98.4 F (36.9 C) (12/17 0500) Pulse Rate:  [62-133] 72 (12/17 0901) Resp:  [13-28] 18 (12/17 0901) BP: (74-129)/(37-73) 113/55 mmHg (12/17 0500) SpO2:  [94 %-100 %] 96 % (12/17 0901) Weight:  [83.3 kg (183 lb 10.3 oz)-84.4 kg (186 lb 1.1 oz)] 83.3 kg (183 lb 10.3 oz) (12/16 2245) Weight change: -9.895 kg (-21 lb 13 oz)  Intake/Output from previous day: 12/16 0701 - 12/17 0700 In: 940 [P.O.:840; I.V.:100] Out: 1761 [Urine:550] Intake/Output this shift:    General appearance: alert, cooperative and moderately obese Resp: diminished breath sounds bilaterally and wheezes bilaterally Chest wall: RIJ cath Cardio: S1, S2 normal and systolic murmur: holosystolic 2/6, blowing at apex GI: obese, pos bs, soft, liver down 5 cm Extremities: extremities normal, atraumatic, no cyanosis or edema  Lab Results:  Recent Labs  01/30/14 0553 01/31/14 0408  WBC 8.6 10.0  HGB 9.8* 10.2*  HCT 31.5* 32.5*  PLT 270 161   BMET:  Recent Labs  01/30/14 1753 01/31/14 0408  NA 140 141  K 3.3* 3.5*  CL 103 102  CO2 21 25  GLUCOSE 141* 119*  BUN 51* 24*  CREATININE 6.57* 4.04*  CALCIUM 8.7 8.5    Recent Labs  01/29/14 1355  PTH 51   Iron Studies: No results for input(s): IRON, TIBC, TRANSFERRIN, FERRITIN in the last 72 hours.  Studies/Results: [place] Abdomen Complete  01/29/2014   CLINICAL DATA:  Abdominal pain, medical renal disease, hypertension  EXAM: ULTRASOUND ABDOMEN COMPLETE  COMPARISON:  Renal ultrasound of January 08, 2014  FINDINGS: Gallbladder: The gallbladder is adequately distended and contains multiple echogenic mobile shadowing stones. There is no gallbladder wall thickening, pericholecystic fluid, or positive sonographic Murphy's sign.  Common bile duct: Diameter: 6.5 mm  Liver: No focal lesion identified. Within normal limits  in parenchymal echogenicity.  IVC: No abnormality visualized.  Pancreas: Largely obscured by bowel gas.  Spleen: Size and appearance within normal limits.  Right Kidney: Length: 13.8 cm. Echogenicity of the cortex is mildly increased. No mass or hydronephrosis visualized. There is a midpole cyst measuring 1.2 cm in diameter.  Left Kidney: Length: 12.5 cm. Echogenicity of the cortex is mildly increased. No mass or hydronephrosis visualized.  Abdominal aorta: No aneurysm visualized.  Other findings: None.  IMPRESSION: 1. Gallstones without evidence of acute cholecystitis. 2. Increased renal cortical echotexture consistent with medical renal disease. This is similar to that seen previously.   Electronically Signed   By: David  [place]   On: 01/29/2014 13:26   Dg Chest Port 1 View  01/30/2014   CLINICAL DATA:  Dialysis catheter insertion.  EXAM: PORTABLE CHEST - 1 VIEW  COMPARISON:  01/29/2014.  FINDINGS: Trachea is midline. Heart size stable. Right IJ dialysis catheter tips project over the SVC. Biapical pleural thickening. Question developing airspace disease in the left lower lobe. Lungs are otherwise clear. No pneumothorax. No definite pleural fluid.  IMPRESSION: 1. Right IJ dialysis catheter insertion without pneumothorax. 2. Question developing left lower lobe airspace disease.   Electronically Signed   By: Leanna BattlesMelinda  Blietz M.D.   On: 01/30/2014 13:48   Dg Fluoro Guide Cv Line-no Report  01/30/2014   CLINICAL DATA:    FLOURO GUIDE CV LINE  Fluoroscopy was utilized by the requesting physician.  No radiographic  interpretation.     I have reviewed the patient's current  medications.  Assessment/Plan: 1  AKI nonoliguric.  HD yest.  Anticipate on Sat.  Will set up for Renal bx in am.  Discussed risks benefits, to do for dx/px, risk of major bleeding and death 2 anemia stable 3 O2 dependent Copd,  4 GERD P Renal bx, Hd Sat, counseled.      LOS: 2 days   Kataleah Bejar L 01/31/2014,10:11 AM

## 2014-02-01 ENCOUNTER — Inpatient Hospital Stay (HOSPITAL_COMMUNITY): Payer: Medicare Other

## 2014-02-01 ENCOUNTER — Encounter (HOSPITAL_COMMUNITY): Payer: Self-pay | Admitting: Vascular Surgery

## 2014-02-01 LAB — CBC
HCT: 30.8 % — ABNORMAL LOW (ref 36.0–46.0)
HCT: 30.6 % — ABNORMAL LOW (ref 36.0–46.0)
Hemoglobin: 10 g/dL — ABNORMAL LOW (ref 12.0–15.0)
Hemoglobin: 9.5 g/dL — ABNORMAL LOW (ref 12.0–15.0)
MCH: 30.3 pg (ref 26.0–34.0)
MCH: 28.5 pg (ref 26.0–34.0)
MCHC: 32.5 g/dL (ref 30.0–36.0)
MCHC: 31 g/dL (ref 30.0–36.0)
MCV: 93.3 fL (ref 78.0–100.0)
MCV: 91.9 fL (ref 78.0–100.0)
PLATELETS: 230 10*3/uL (ref 150–400)
Platelets: 186 10*3/uL (ref 150–400)
RBC: 3.3 MIL/uL — ABNORMAL LOW (ref 3.87–5.11)
RBC: 3.33 MIL/uL — AB (ref 3.87–5.11)
RDW: 15.7 % — AB (ref 11.5–15.5)
RDW: 15.6 % — ABNORMAL HIGH (ref 11.5–15.5)
WBC: 9.7 10*3/uL (ref 4.0–10.5)
WBC: 11.6 10*3/uL — ABNORMAL HIGH (ref 4.0–10.5)

## 2014-02-01 LAB — RENAL FUNCTION PANEL
ALBUMIN: 2.6 g/dL — AB (ref 3.5–5.2)
Anion gap: 17 — ABNORMAL HIGH (ref 5–15)
BUN: 39 mg/dL — ABNORMAL HIGH (ref 6–23)
CALCIUM: 9.2 mg/dL (ref 8.4–10.5)
CO2: 23 mEq/L (ref 19–32)
CREATININE: 5.85 mg/dL — AB (ref 0.50–1.10)
Chloride: 101 mEq/L (ref 96–112)
GFR, EST AFRICAN AMERICAN: 7 mL/min — AB (ref >90)
GFR, EST NON AFRICAN AMERICAN: 6 mL/min — AB (ref >90)
Glucose, Bld: 95 mg/dL (ref 70–99)
PHOSPHORUS: 6.8 mg/dL — AB (ref 2.3–4.6)
Potassium: 3.7 mEq/L (ref 3.7–5.3)
SODIUM: 141 meq/L (ref 137–147)

## 2014-02-01 LAB — TYPE AND SCREEN
ABO/RH(D): A POS
Antibody Screen: NEGATIVE

## 2014-02-01 LAB — ABO/RH: ABO/RH(D): A POS

## 2014-02-01 MED ORDER — HEPARIN SODIUM (PORCINE) 1000 UNIT/ML DIALYSIS: 1000.0000 [IU] | INTRAMUSCULAR | Status: DC | PRN

## 2014-02-01 MED ORDER — OXYCODONE-ACETAMINOPHEN 5-325 MG PO TABS
1.0000 | ORAL_TABLET | ORAL | Status: DC | PRN
  Administered 2014-02-02: 1 via ORAL
  Filled 2014-02-01: qty 1

## 2014-02-01 MED ORDER — ACETAMINOPHEN 500 MG PO TABS: 500.0000 mg | ORAL_TABLET | Freq: Four times a day (QID) | ORAL | Status: DC | PRN

## 2014-02-01 MED ORDER — NEPRO/CARBSTEADY PO LIQD: 237.0000 mL | ORAL | Status: DC | PRN

## 2014-02-01 MED ORDER — PENTAFLUOROPROP-TETRAFLUOROETH EX AERO: 1.0000 "application " | INHALATION_SPRAY | CUTANEOUS | Status: DC | PRN

## 2014-02-01 MED ORDER — SODIUM CHLORIDE 0.9 % IV SOLN: INTRAVENOUS | Status: DC

## 2014-02-01 MED ORDER — SODIUM CHLORIDE 0.9 % IV SOLN: 100.0000 mL | INTRAVENOUS | Status: DC | PRN

## 2014-02-01 MED ORDER — LIDOCAINE HCL (PF) 1 % IJ SOLN: 5.0000 mL | INTRAMUSCULAR | Status: DC | PRN

## 2014-02-01 MED ORDER — ALTEPLASE 2 MG IJ SOLR: 2.0000 mg | Freq: Once | INTRAMUSCULAR | Status: AC | PRN

## 2014-02-01 MED ORDER — LIDOCAINE-PRILOCAINE 2.5-2.5 % EX CREA: 1.0000 "application " | TOPICAL_CREAM | CUTANEOUS | Status: DC | PRN

## 2014-02-01 NOTE — Procedures (Signed)
Patient in prone position.  Kidneys localized with U/S.  Prep clorohexadine, xylocaine LA.  Using U/S guidance 3 passes to obtain 2 cores of tissue.  EBL 5cc.  Tolerated  Well. L kidney biopsy.  .Marland Kitchen

## 2014-02-01 NOTE — Care Management Note (Signed)
CARE MANAGEMENT NOTE 02/01/2014  Patient:  Strong,[name]   Account Number:  [id number]  Date Initiated:  02/01/2014  Documentation initiated by:  Mirissa Lopresti  Subjective/Objective Assessment:   CM following for progress and d/c planning.     Action/Plan:   Met with pt family member and IM given current plan is to return to SNF.   Anticipated DC Date:  02/06/2014   Anticipated DC Plan:  SKILLED NURSING FACILITY         Choice offered to / List presented to:             Status of service:  In process, will continue to follow Medicare Important Message given?  YES (If response is "NO", the following Medicare IM given date fields will be blank) Date Medicare IM given:  02/01/2014 Medicare IM given by:  Venessa Wickham Date Additional Medicare IM given:   Additional Medicare IM given by:    Discharge Disposition:    Per UR Regulation:    If discussed at Long Length of Stay Meetings, dates discussed:    Comments:

## 2014-02-01 NOTE — Plan of Care (Signed)
Problem: Phase I Progression Outcomes Goal: OOB as tolerated unless otherwise ordered Outcome: Progressing Pt OOB for dinner.

## 2014-02-01 NOTE — Progress Notes (Signed)
Subjective: Interval History: has complaints did not sleep well.  Objective: Vital signs in last 24 hours: Temp:  [98.2 F (36.8 C)-98.6 F (37 C)] 98.2 F (36.8 C) (12/18 0500) Pulse Rate:  [122-123] 122 (12/18 0500) Resp:  [18] 18 (12/18 0500) BP: (89-94)/(42-46) 94/46 mmHg (12/18 0500) SpO2:  [94 %-98 %] 94 % (12/18 0813) FiO2 (%):  [32 %] 32 % (12/18 0813) Weight:  [82.6 kg (182 lb 1.6 oz)] 82.6 kg (182 lb 1.6 oz) (12/17 2022) Weight change: -1.4 kg (-3 lb 1.4 oz)  Intake/Output from previous day: 12/17 0701 - 12/18 0700 In: 363 [P.O.:360; I.V.:3] Out: 350 [Urine:350] Intake/Output this shift:    General appearance: cooperative, moderately obese, pale and syndromic appearance - not Resp: diminished breath sounds bilaterally Chest wall: RIJ PC Cardio: S1, S2 normal and systolic murmur: holosystolic 2/6, blowing at apex GI: pos bs,nontender,soft Extremities: extremities normal, atraumatic, no cyanosis or edema  Lab Results:  Recent Labs  01/31/14 0408 02/01/14 0438  WBC 10.0 9.7  HGB 10.2* 10.0*  HCT 32.5* 30.8*  PLT 161 230   BMET:  Recent Labs  01/31/14 0408 02/01/14 0500  NA 141 141  K 3.5* 3.7  CL 102 101  CO2 25 23  GLUCOSE 119* 95  BUN 24* 39*  CREATININE 4.04* 5.85*  CALCIUM 8.5 9.2    Recent Labs  01/29/14 1355  PTH 51   Iron Studies: No results for input(s): IRON, TIBC, TRANSFERRIN, FERRITIN in the last 72 hours.  Studies/Results: Dg Chest Port 1 View  01/30/2014   CLINICAL DATA:  Dialysis catheter insertion.  EXAM: PORTABLE CHEST - 1 VIEW  COMPARISON:  01/29/2014.  FINDINGS: Trachea is midline. Heart size stable. Right IJ dialysis catheter tips project over the SVC. Biapical pleural thickening. Question developing airspace disease in the left lower lobe. Lungs are otherwise clear. No pneumothorax. No definite pleural fluid.  IMPRESSION: 1. Right IJ dialysis catheter insertion without pneumothorax. 2. Question developing left lower lobe  airspace disease.   Electronically Signed   By: Leanna BattlesMelinda  Blietz M.D.   On: 01/30/2014 13:48   Dg Fluoro Guide Cv Line-no Report  01/30/2014   CLINICAL DATA:    FLOURO GUIDE CV LINE  Fluoroscopy was utilized by the requesting physician.  No radiographic  interpretation.     I have reviewed the patient's current medications.  Assessment/Plan: 1  AKI nonoliguric. No recovery of function . For bx. 2 anemia 3 COPD stable 4 D resolved 5 Uremia resolved P Renal bx, HD in am.      LOS: 3 days   Burnadette Baskett L 02/01/2014,10:03 AM

## 2014-02-01 NOTE — Progress Notes (Addendum)
PATIENT DETAILS Name: [name] Age: 79 y.o. Sex: female Date of Birth: [birth year] Admit Date: 01/29/2014 Admitting Physician Benny Lennert, MD PCP:No primary care provider on file.  Subjective:  No abd pain, nausea or vomiting today  Assessment/Plan: Principal Problem:  Acute renal failure: Etiology not clear, recently admitted to this hospital and underwent hemodialysis 2, by discharge creatinine was improving, hemodialysis catheter was removed and patient subsequently discharged to SNF. Returned on 12/15 with worsening nausea, vomiting, diffuse abdominal pain. Further worsening in creatinine. Renal/VVS consulted,  hemodialysis catheter placed  and underwent hemodialysis on 12/16. Etiology of renal failure not evident, but patient does have some eosinophilia, concern for AIN/[place]uss-nephrology planning renal biopsy 12/18.  Active Problems:   Nausea vomiting and abdominal pain:Resolved with supportive measures. Continue Supportive care.Likely secondary to above. Abdominal ultrasound shows gallstones, however abdominal pain is diffuse and not likely related to cholelithiasis.Lipase levels normal on admission.    Hypothyroidism: Continue with levothyroxine    GERD:continue PPI    COPD: Lungs clear, and tinea nebulized bronchodilators.    History of hypertension: Previously on antihypertensives, currently blood pressure controlled-infact soft use of any antihypertensives. Suspect will need starting Midodrine.Follow BP.    Failure to thrive syndrome: Recurrent hospitalization, worsening renal failure. Obtain PT/nutrition eval. Follow clinically.    Severe malnutrition in the context of acute illness or injury:c/w supplements  Disposition: Remain inpatient-suspect will need SNF on discharge  Antibiotics: None  Anti-infectives    None      DVT Prophylaxis: SCD's for now-resume pharmacologic agents once procedures are complete  Code Status: Full  code  Family Communication Son-Keith at bedside  Procedures:  nONE  CONSULTS:  nephrology and vascular surgery  MEDICATIONS: Scheduled Meds: . aspirin EC  81 mg Oral Daily  . feeding supplement (NEPRO CARB STEADY)  237 mL Oral BID BM  . latanoprost  1 drop Both Eyes QHS  . levothyroxine  25 mcg Oral QAC breakfast  . loratadine  10 mg Oral Daily  . LORazepam  2 mg Oral Once  . mometasone-formoterol  2 puff Inhalation BID  . pantoprazole  40 mg Oral Q1200  . pilocarpine  1 drop Right Eye QID  . senna-docusate  1 tablet Oral BID  . sodium chloride  3 mL Intravenous Q12H   Continuous Infusions: . sodium chloride 10 mL/hr at 01/30/14 1200  . sodium chloride 10 mL/hr at 02/01/14 1001   PRN Meds:.sodium chloride, acetaminophen **OR** acetaminophen, albuterol, ondansetron **OR** ondansetron (ZOFRAN) IV, sodium chloride    PHYSICAL EXAM: Vital signs in last 24 hours: Filed Vitals:   01/31/14 2022 01/31/14 2037 02/01/14 0500 02/01/14 0813  BP: 89/42  94/46   Pulse: 123  122   Temp: 98.6 F (37 C)  98.2 F (36.8 C)   TempSrc: Oral  Oral   Resp: 18  18   Height:      Weight: 82.6 kg (182 lb 1.6 oz)     SpO2: 97% 98% 94% 94%    Weight change: -1.4 kg (-3 lb 1.4 oz) Filed Weights   01/30/14 2214 01/30/14 2245 01/31/14 2022  Weight: 83.3 kg (183 lb 10.3 oz) 83.3 kg (183 lb 10.3 oz) 82.6 kg (182 lb 1.6 oz)   Body mass index is 27.69 kg/(m^2).   Gen Exam: Awake and alert with clear speech.  Sleeping comfortably when i walked in-son at bedside. Easily awakens. Neck: Supple, No JVD.   Chest: B/L Clear.  No  rales or rhochi CVS:S1S2 Regular, no murmurs. Abdomen: soft, BS +, non tender, non distended.  Extremities: no edema, lower extremities warm to touch. Neurologic: Non Focal.   Skin: No Rash.   Wounds: N/A.    Intake/Output from previous day:  Intake/Output Summary (Last 24 hours) at 02/01/14 1002 Last data filed at 02/01/14 0500  Gross per 24 hour  Intake    243  ml  Output    350 ml  Net   -107 ml     LAB RESULTS: CBC  Recent Labs Lab 01/29/14 0810 01/29/14 0815 01/29/14 1355 01/30/14 0553 01/31/14 0408 02/01/14 0438  WBC 11.5*  --  9.5 8.6 10.0 9.7  HGB 11.0* 12.6 9.3* 9.8* 10.2* 10.0*  HCT 34.4* 37.0 29.6* 31.5* 32.5* 30.8*  PLT 303  --  303 270 161 230  MCV 90.8  --  90.8 91.3 92.9 93.3  MCH 29.0  --  28.5 28.4 29.1 30.3  MCHC 32.0  --  31.4 31.1 31.4 32.5  RDW 15.8*  --  15.7* 15.9* 15.9* 15.7*  LYMPHSABS 2.4  --   --  1.2  --   --   MONOABS 1.1*  --   --  1.1*  --   --   EOSABS 0.9*  --   --  0.8*  --   --   BASOSABS 0.2*  --   --  0.1  --   --     Chemistries   Recent Labs Lab 01/29/14 0810 01/29/14 0815 01/29/14 1355 01/30/14 0553 01/30/14 1753 01/31/14 0408 02/01/14 0500  NA 140 142  --  141 140 141 141  K 3.8 3.6*  --  3.6* 3.3* 3.5* 3.7  CL 104 111  --  106 103 102 101  CO2 17*  --   --  17* 21 25 23   GLUCOSE 123* 130*  --  134* 141* 119* 95  BUN 61* 60*  --  61* 51* 24* 39*  CREATININE 7.94* 9.10* 8.08* 7.64* 6.57* 4.04* 5.85*  CALCIUM 9.5  --   --  9.0 8.7 8.5 9.2  MG  --   --   --   --  2.2  --   --     CBG: No results for input(s): GLUCAP in the last 168 hours.  GFR Estimated Creatinine Clearance: 9.1 mL/min (by C-G formula based on Cr of 5.85).  Coagulation profile  Recent Labs Lab 01/30/14 0921  INR 1.13    Cardiac Enzymes No results for input(s): CKMB, TROPONINI, MYOGLOBIN in the last 168 hours.  Invalid input(s): CK  Invalid input(s): POCBNP No results for input(s): DDIMER in the last 72 hours. No results for input(s): HGBA1C in the last 72 hours. No results for input(s): CHOL, HDL, LDLCALC, TRIG, CHOLHDL, LDLDIRECT in the last 72 hours. No results for input(s): TSH, T4TOTAL, T3FREE, THYROIDAB in the last 72 hours.  Invalid input(s): FREET3 No results for input(s): VITAMINB12, FOLATE, FERRITIN, TIBC, IRON, RETICCTPCT in the last 72 hours.  Recent Labs  01/29/14 1355  LIPASE  42    Urine Studies No results for input(s): UHGB, CRYS in the last 72 hours.  Invalid input(s): UACOL, UAPR, USPG, UPH, UTP, UGL, UKET, UBIL, UNIT, UROB, ULEU, UEPI, UWBC, URBC, UBAC, CAST, UCOM, BILUA  MICROBIOLOGY: No results found for this or any previous visit (from the past 240 hour(s)).  RADIOLOGY STUDIES/RESULTS: [place] Abdomen Complete  01/29/2014   CLINICAL DATA:  Abdominal pain, medical renal disease, hypertension  EXAM: ULTRASOUND ABDOMEN  COMPLETE  COMPARISON:  Renal ultrasound of January 08, 2014  FINDINGS: Gallbladder: The gallbladder is adequately distended and contains multiple echogenic mobile shadowing stones. There is no gallbladder wall thickening, pericholecystic fluid, or positive sonographic Murphy's sign.  Common bile duct: Diameter: 6.5 mm  Liver: No focal lesion identified. Within normal limits in parenchymal echogenicity.  IVC: No abnormality visualized.  Pancreas: Largely obscured by bowel gas.  Spleen: Size and appearance within normal limits.  Right Kidney: Length: 13.8 cm. Echogenicity of the cortex is mildly increased. No mass or hydronephrosis visualized. There is a midpole cyst measuring 1.2 cm in diameter.  Left Kidney: Length: 12.5 cm. Echogenicity of the cortex is mildly increased. No mass or hydronephrosis visualized.  Abdominal aorta: No aneurysm visualized.  Other findings: None.  IMPRESSION: 1. Gallstones without evidence of acute cholecystitis. 2. Increased renal cortical echotexture consistent with medical renal disease. This is similar to that seen previously.   Electronically Signed   By: David  [place]   On: 01/29/2014 13:26   US Renal  01/08/2014   CLINICAL DATA:  Acute renal failure  EXAM: RENAL/URINARY TRACT ULTRASOUND COMPLETE  COMPARISON:  None.  FINDINGS: Right Kidney:  Length: 15 cm. Increased renal cortical echogenicity. No mass or hydronephrosis visualized.  Left Kidney:  Length: 12.3 cm. Increased renal cortical echogenicity. No mass or  hydronephrosis visualized.  Bladder:  Appears normal for degree of bladder distention.  IMPRESSION: 1. No obstructive uropathy. 2. Increased renal cortical echogenicity as can be seen with medical renal disease.   Electronically Signed   By: Elige Ko   On: 01/08/2014 21:15   Dg Chest Port 1 View  01/30/2014   CLINICAL DATA:  Dialysis catheter insertion.  EXAM: PORTABLE CHEST - 1 VIEW  COMPARISON:  01/29/2014.  FINDINGS: Trachea is midline. Heart size stable. Right IJ dialysis catheter tips project over the SVC. Biapical pleural thickening. Question developing airspace disease in the left lower lobe. Lungs are otherwise clear. No pneumothorax. No definite pleural fluid.  IMPRESSION: 1. Right IJ dialysis catheter insertion without pneumothorax. 2. Question developing left lower lobe airspace disease.   Electronically Signed   By: Leanna Battles M.D.   On: 01/30/2014 13:48   Dg Chest Port 1 View  01/29/2014   CLINICAL DATA:  New onset of nausea and shortness of Breath  EXAM: PORTABLE CHEST - 1 VIEW  COMPARISON:  01/09/2014  FINDINGS: Cardiomegaly again noted. Atherosclerotic calcifications of thoracic aorta. No acute infiltrate or pulmonary edema. Mild basilar atelectasis.  IMPRESSION: No active disease.  Mild basilar atelectasis.   Electronically Signed   By: Natasha Mead M.D.   On: 01/29/2014 08:38   Dg Chest Port 1 View  01/09/2014   CLINICAL DATA:  Hypoxia  EXAM: PORTABLE CHEST - 1 VIEW  COMPARISON:  01/08/2014  FINDINGS: Cardiomediastinal silhouette is stable. Mild elevation of the left hemidiaphragm. There is central mild vascular congestion and mild interstitial prominence suspicious for mild interstitial edema. Small left pleural effusion with left basilar atelectasis or infiltrate. Right IJ sheath is unchanged in position.  IMPRESSION: Mild vascular congestion mild interstitial prominence suspicious for mild interstitial edema. Small left pleural effusion with left basilar atelectasis or  infiltrate.   Electronically Signed   By: Natasha Mead M.D.   On: 01/09/2014 10:45   Dg Chest Port 1 View  01/08/2014   CLINICAL DATA:  Evaluate central line placement.  EXAM: PORTABLE CHEST - 1 VIEW  COMPARISON:  01/08/2014  FINDINGS: There is a right IJ  catheter with tip in the projection of the SVC. No pneumothorax identified. Mild cardiac enlargement. There is calcified plaque identified within the aortic arch. Pulmonary vascular congestion is noted. There is bibasilar atelectasis.  IMPRESSION: 1. Tip of the IJ catheter is in the SVC.  No pneumothorax. 2. Cardiac enlargement, atherosclerotic disease and pulmonary vascular congestion.   Electronically Signed   By: Signa Kellaylor  Stroud M.D.   On: 01/08/2014 23:52   Dg Abd Portable 1v  01/13/2014   CLINICAL DATA:  Generalized abdominal pain, nausea.  EXAM: PORTABLE ABDOMEN - 1 VIEW  COMPARISON:  January 08, 2014.  FINDINGS: The bowel gas pattern is normal. No radio-opaque calculi or other significant radiographic abnormality are seen.  IMPRESSION: No evidence of bowel obstruction or ileus.   Electronically Signed   By: Roque LiasJames  Green M.D.   On: 01/13/2014 18:18   Dg Fluoro Guide Cv Line-no Report  01/30/2014   CLINICAL DATA:    FLOURO GUIDE CV LINE  Fluoroscopy was utilized by the requesting physician.  No radiographic  interpretation.     Jeoffrey MassedGHIMIRE,Yilia Sacca, MD  [facility] Pager:[phone]  If 7PM-7AM, please contact night-coverage [email or web address] Password [email or web address] 02/01/2014, 10:02 AM   LOS: 3 days

## 2014-02-02 DIAGNOSIS — E43 Unspecified severe protein-calorie malnutrition: Secondary | ICD-10-CM

## 2014-02-02 LAB — CBC
HEMATOCRIT: 29.1 % — AB (ref 36.0–46.0)
Hemoglobin: 9 g/dL — ABNORMAL LOW (ref 12.0–15.0)
MCH: 28.6 pg (ref 26.0–34.0)
MCHC: 30.9 g/dL (ref 30.0–36.0)
MCV: 92.4 fL (ref 78.0–100.0)
PLATELETS: 177 10*3/uL (ref 150–400)
RBC: 3.15 MIL/uL — ABNORMAL LOW (ref 3.87–5.11)
RDW: 15.6 % — ABNORMAL HIGH (ref 11.5–15.5)
WBC: 11.4 10*3/uL — ABNORMAL HIGH (ref 4.0–10.5)

## 2014-02-02 LAB — RENAL FUNCTION PANEL
ALBUMIN: 2.4 g/dL — AB (ref 3.5–5.2)
Anion gap: 17 — ABNORMAL HIGH (ref 5–15)
BUN: 51 mg/dL — AB (ref 6–23)
CO2: 21 mEq/L (ref 19–32)
CREATININE: 7.12 mg/dL — AB (ref 0.50–1.10)
Calcium: 9.6 mg/dL (ref 8.4–10.5)
Chloride: 98 mEq/L (ref 96–112)
GFR calc Af Amer: 6 mL/min — ABNORMAL LOW (ref >90)
GFR, EST NON AFRICAN AMERICAN: 5 mL/min — AB (ref >90)
Glucose, Bld: 101 mg/dL — ABNORMAL HIGH (ref 70–99)
PHOSPHORUS: 8.7 mg/dL — AB (ref 2.3–4.6)
Potassium: 3.9 mEq/L (ref 3.7–5.3)
Sodium: 136 mEq/L — ABNORMAL LOW (ref 137–147)

## 2014-02-02 MED ORDER — HEPARIN SODIUM (PORCINE) 5000 UNIT/ML IJ SOLN
5000.0000 [IU] | Freq: Three times a day (TID) | INTRAMUSCULAR | Status: DC
  Administered 2014-02-03 – 2014-02-13 (×30): 5000 [IU] via SUBCUTANEOUS
  Filled 2014-02-02 (×35): qty 1

## 2014-02-02 NOTE — Procedures (Signed)
I was present at this session.  I have reviewed the session itself and made appropriate changes.  BP low on arrival, pain meds recent, now ok.  Low flows 2nd HD.  tol well, normal MS  Jessilynn Taft L 12/19/20159:04 AM

## 2014-02-02 NOTE — Progress Notes (Signed)
Subjective: Interval History: has no complaint of pain at bx site. Neck bothers.  Objective: Vital signs in last 24 hours: Temp:  [97.6 F (36.4 C)-98.9 F (37.2 C)] 98.3 F (36.8 C) (12/19 0656) Pulse Rate:  [64-119] 75 (12/19 0830) Resp:  [17-19] 18 (12/19 0703) BP: (77-127)/(29-100) 103/49 mmHg (12/19 0830) SpO2:  [95 %-100 %] 98 % (12/19 0656) Weight:  [81.8 kg (180 lb 5.4 oz)-82.6 kg (182 lb 1.6 oz)] 81.8 kg (180 lb 5.4 oz) (12/19 0656) Weight change: 0 kg (0 lb)  Intake/Output from previous day: 12/18 0701 - 12/19 0700 In: 420 [P.O.:420] Out: 250 [Urine:250] Intake/Output this shift:    General appearance: cooperative, moderately obese, pale and slowed mentation Resp: diminished breath sounds RIJ PC Chest wall: RIJ PC Cardio: S1, S2 normal and systolic murmur: holosystolic 2/6, blowing at apex GI: obese, pos bs, liver down 4 cm, soft,nontender Extremities: extremities normal, atraumatic, no cyanosis or edema  Lab Results:  Recent Labs  02/01/14 1710 02/02/14 0440  WBC 11.6* 11.4*  HGB 9.5* 9.0*  HCT 30.6* 29.1*  PLT 186 177   BMET:  Recent Labs  02/01/14 0500 02/02/14 0440  NA 141 136*  K 3.7 3.9  CL 101 98  CO2 23 21  GLUCOSE 95 101*  BUN 39* 51*  CREATININE 5.85* 7.12*  CALCIUM 9.2 9.6   No results for input(s): PTH in the last 72 hours. Iron Studies: No results for input(s): IRON, TIBC, TRANSFERRIN, FERRITIN in the last 72 hours.  Studies/Results: [place] Biopsy-no Radiologist  02/01/2014   CLINICAL DATA:    Ultrasound was utilized for biopsy by the requesting physician.     I have reviewed the patient's current medications.  Assessment/Plan: 1 AKI nonoliguric .  No function.  For HD.  BX pending.   2 eos repeat 3 COPD stable 4 FTT suspect renal dz playing a role 5 Anemia mildly lower 6 ^ lipids P HD, push pos, await bx, follow blood counts    LOS: 4 days   Peggye Poon L 02/02/2014,9:05 AM

## 2014-02-02 NOTE — Progress Notes (Signed)
PATIENT DETAILS Name: [name] Age: 79 y.o. Sex: female Date of Birth: 07/04/1934 Admit Date: 01/29/2014 Admitting Physician Benny Lennert, MD PCP:No primary care provider on file.  Subjective: No abd pain-nausea following HD-sleeping comfortably-denies any other complaints  Assessment/Plan: Principal Problem:  Acute renal failure: Etiology not clear, recently admitted to this hospital and underwent hemodialysis 2, by discharge creatinine was improving, hemodialysis catheter was removed and patient subsequently discharged to SNF. Returned on 12/15 with worsening nausea, vomiting, diffuse abdominal pain. Further worsening in creatinine. Renal/VVS consulted,  hemodialysis catheter placed  and underwent hemodialysis X 2 so far. Etiology of renal failure not evident, but patient does have some eosinophilia, concern for AIN/Churg Strauss-underwent renal biopsy 12/18-pending results.Still awaiting return of renal function before declaring patient at ESRD.  Active Problems:   Nausea vomiting and abdominal pain:Mostly resolved with supportive measures-but intermittent nausea continues. Continue Supportive care.Likely secondary to above. Abdominal ultrasound shows gallstones, however abdominal pain has resolved, and nausea not likely related to cholelithiasis.Lipase levels normal on admission.    Hypothyroidism: Continue with levothyroxine    GERD:continue PPI    COPD: Lungs clear, c/w prn bronchodilators.    History of hypertension: Previously on antihypertensives, currently blood pressure controlled-infact soft use of any antihypertensives. Suspect will need starting Midodrine.Follow BP.    Failure to thrive syndrome: Recurrent hospitalization, worsening renal failure. Obtain PT/nutrition eval. Follow clinically.    Severe malnutrition in the context of acute illness or injury:c/w supplements  Disposition: Remain inpatient-suspect will need SNF on  discharge  Antibiotics: None  Anti-infectives    None      DVT Prophylaxis: Since all procedures now completed-start prophylatic heparin on 12/20  Code Status: Full code  Family Communication Son-Keith at bedside  Procedures:  nONE  CONSULTS:  nephrology and vascular surgery  MEDICATIONS: Scheduled Meds: . aspirin EC  81 mg Oral Daily  . feeding supplement (NEPRO CARB STEADY)  237 mL Oral BID BM  . latanoprost  1 drop Both Eyes QHS  . levothyroxine  25 mcg Oral QAC breakfast  . loratadine  10 mg Oral Daily  . mometasone-formoterol  2 puff Inhalation BID  . pantoprazole  40 mg Oral Q1200  . pilocarpine  1 drop Right Eye QID  . senna-docusate  1 tablet Oral BID  . sodium chloride  3 mL Intravenous Q12H   Continuous Infusions: . sodium chloride 10 mL/hr at 01/30/14 1200  . sodium chloride 10 mL/hr at 02/01/14 1001  . sodium chloride     PRN Meds:.sodium chloride, acetaminophen **OR** acetaminophen, acetaminophen, albuterol, ondansetron **OR** ondansetron (ZOFRAN) IV, oxyCODONE-acetaminophen, sodium chloride    PHYSICAL EXAM: Vital signs in last 24 hours: Filed Vitals:   02/02/14 0900 02/02/14 0930 02/02/14 1000 02/02/14 1001  BP: 110/73 109/57 97/57 113/52  Pulse: 74 75 84 85  Temp:    98.2 F (36.8 C)  TempSrc:    Oral  Resp:    20  Height:      Weight:    83 kg (182 lb 15.7 oz)  SpO2:    98%    Weight change: 0 kg (0 lb) Filed Weights   02/01/14 2021 02/02/14 0656 02/02/14 1001  Weight: 82.6 kg (182 lb 1.6 oz) 81.8 kg (180 lb 5.4 oz) 83 kg (182 lb 15.7 oz)   Body mass index is 27.83 kg/(m^2).   Gen Exam: Awake and alert with clear speech.  Sleeping comfortably when i walked in-son at bedside. Easily  awakens. Neck: Supple, No JVD.   Chest: B/L Clear.  No rales or rhochi CVS:S1S2 Regular, no murmurs. Abdomen: soft, BS +, non tender, non distended.  Extremities: no edema, lower extremities warm to touch. Neurologic: Non Focal.   Skin: No Rash.    Wounds: N/A.    Intake/Output from previous day:  Intake/Output Summary (Last 24 hours) at 02/02/14 1230 Last data filed at 02/02/14 1001  Gross per 24 hour  Intake    420 ml  Output   -707 ml  Net   1127 ml     LAB RESULTS: CBC  Recent Labs Lab 01/29/14 0810  01/30/14 0553 01/31/14 0408 02/01/14 0438 02/01/14 1710 02/02/14 0440  WBC 11.5*  < > 8.6 10.0 9.7 11.6* 11.4*  HGB 11.0*  < > 9.8* 10.2* 10.0* 9.5* 9.0*  HCT 34.4*  < > 31.5* 32.5* 30.8* 30.6* 29.1*  PLT 303  < > 270 161 230 186 177  MCV 90.8  < > 91.3 92.9 93.3 91.9 92.4  MCH 29.0  < > 28.4 29.1 30.3 28.5 28.6  MCHC 32.0  < > 31.1 31.4 32.5 31.0 30.9  RDW 15.8*  < > 15.9* 15.9* 15.7* 15.6* 15.6*  LYMPHSABS 2.4  --  1.2  --   --   --   --   MONOABS 1.1*  --  1.1*  --   --   --   --   EOSABS 0.9*  --  0.8*  --   --   --   --   BASOSABS 0.2*  --  0.1  --   --   --   --   < > = values in this interval not displayed.  Chemistries   Recent Labs Lab 01/30/14 0553 01/30/14 1753 01/31/14 0408 02/01/14 0500 02/02/14 0440  NA 141 140 141 141 136*  K 3.6* 3.3* 3.5* 3.7 3.9  CL 106 103 102 101 98  CO2 17* 21 25 23 21   GLUCOSE 134* 141* 119* 95 101*  BUN 61* 51* 24* 39* 51*  CREATININE 7.64* 6.57* 4.04* 5.85* 7.12*  CALCIUM 9.0 8.7 8.5 9.2 9.6  MG  --  2.2  --   --   --     CBG: No results for input(s): GLUCAP in the last 168 hours.  GFR Estimated Creatinine Clearance: 7.5 mL/min (by C-G formula based on Cr of 7.12).  Coagulation profile  Recent Labs Lab 01/30/14 0921  INR 1.13    Cardiac Enzymes No results for input(s): CKMB, TROPONINI, MYOGLOBIN in the last 168 hours.  Invalid input(s): CK  Invalid input(s): POCBNP No results for input(s): DDIMER in the last 72 hours. No results for input(s): HGBA1C in the last 72 hours. No results for input(s): CHOL, HDL, LDLCALC, TRIG, CHOLHDL, LDLDIRECT in the last 72 hours. No results for input(s): TSH, T4TOTAL, T3FREE, THYROIDAB in the last 72  hours.  Invalid input(s): FREET3 No results for input(s): VITAMINB12, FOLATE, FERRITIN, TIBC, IRON, RETICCTPCT in the last 72 hours. No results for input(s): LIPASE, AMYLASE in the last 72 hours.  Urine Studies No results for input(s): UHGB, CRYS in the last 72 hours.  Invalid input(s): UACOL, UAPR, USPG, UPH, UTP, UGL, UKET, UBIL, UNIT, UROB, ULEU, UEPI, UWBC, URBC, UBAC, CAST, UCOM, BILUA  MICROBIOLOGY: No results found for this or any previous visit (from the past 240 hour(s)).  RADIOLOGY STUDIES/RESULTS: US Abdomen Complete  01/29/2014   CLINICAL DATA:  Abdominal pain, medical renal disease, hypertension  EXAM: ULTRASOUND ABDOMEN  COMPLETE  COMPARISON:  Renal ultrasound of January 08, 2014  FINDINGS: Gallbladder: The gallbladder is adequately distended and contains multiple echogenic mobile shadowing stones. There is no gallbladder wall thickening, pericholecystic fluid, or positive sonographic Murphy's sign.  Common bile duct: Diameter: 6.5 mm  Liver: No focal lesion identified. Within normal limits in parenchymal echogenicity.  IVC: No abnormality visualized.  Pancreas: Largely obscured by bowel gas.  Spleen: Size and appearance within normal limits.  Right Kidney: Length: 13.8 cm. Echogenicity of the cortex is mildly increased. No mass or hydronephrosis visualized. There is a midpole cyst measuring 1.2 cm in diameter.  Left Kidney: Length: 12.5 cm. Echogenicity of the cortex is mildly increased. No mass or hydronephrosis visualized.  Abdominal aorta: No aneurysm visualized.  Other findings: None.  IMPRESSION: 1. Gallstones without evidence of acute cholecystitis. 2. Increased renal cortical echotexture consistent with medical renal disease. This is similar to that seen previously.   Electronically Signed   By: David  SwazilandJordan   On: 01/29/2014 13:26   [place] Renal  01/08/2014   CLINICAL DATA:  Acute renal failure  EXAM: RENAL/URINARY TRACT ULTRASOUND COMPLETE  COMPARISON:  None.  FINDINGS: Right  Kidney:  Length: 15 cm. Increased renal cortical echogenicity. No mass or hydronephrosis visualized.  Left Kidney:  Length: 12.3 cm. Increased renal cortical echogenicity. No mass or hydronephrosis visualized.  Bladder:  Appears normal for degree of bladder distention.  IMPRESSION: 1. No obstructive uropathy. 2. Increased renal cortical echogenicity as can be seen with medical renal disease.   Electronically Signed   By: Elige KoHetal  Patel   On: 01/08/2014 21:15   [place] Biopsy-no Radiologist  02/01/2014   CLINICAL DATA:    Ultrasound was utilized for biopsy by the requesting physician.    Dg Chest Port 1 View  01/30/2014   CLINICAL DATA:  Dialysis catheter insertion.  EXAM: PORTABLE CHEST - 1 VIEW  COMPARISON:  01/29/2014.  FINDINGS: Trachea is midline. Heart size stable. Right IJ dialysis catheter tips project over the SVC. Biapical pleural thickening. Question developing airspace disease in the left lower lobe. Lungs are otherwise clear. No pneumothorax. No definite pleural fluid.  IMPRESSION: 1. Right IJ dialysis catheter insertion without pneumothorax. 2. Question developing left lower lobe airspace disease.   Electronically Signed   By: Leanna BattlesMelinda  Blietz M.D.   On: 01/30/2014 13:48   Dg Chest Port 1 View  01/29/2014   CLINICAL DATA:  New onset of nausea and shortness of Breath  EXAM: PORTABLE CHEST - 1 VIEW  COMPARISON:  01/09/2014  FINDINGS: Cardiomegaly again noted. Atherosclerotic calcifications of thoracic aorta. No acute infiltrate or pulmonary edema. Mild basilar atelectasis.  IMPRESSION: No active disease.  Mild basilar atelectasis.   Electronically Signed   By: Natasha MeadLiviu  Pop M.D.   On: 01/29/2014 08:38   Dg Chest Port 1 View  01/09/2014   CLINICAL DATA:  Hypoxia  EXAM: PORTABLE CHEST - 1 VIEW  COMPARISON:  01/08/2014  FINDINGS: Cardiomediastinal silhouette is stable. Mild elevation of the left hemidiaphragm. There is central mild vascular congestion and mild interstitial prominence suspicious for mild  interstitial edema. Small left pleural effusion with left basilar atelectasis or infiltrate. Right IJ sheath is unchanged in position.  IMPRESSION: Mild vascular congestion mild interstitial prominence suspicious for mild interstitial edema. Small left pleural effusion with left basilar atelectasis or infiltrate.   Electronically Signed   By: Natasha MeadLiviu  Pop M.D.   On: 01/09/2014 10:45   Dg Chest Port 1 View  01/08/2014   CLINICAL  DATA:  Evaluate central line placement.  EXAM: PORTABLE CHEST - 1 VIEW  COMPARISON:  01/08/2014  FINDINGS: There is a right IJ catheter with tip in the projection of the SVC. No pneumothorax identified. Mild cardiac enlargement. There is calcified plaque identified within the aortic arch. Pulmonary vascular congestion is noted. There is bibasilar atelectasis.  IMPRESSION: 1. Tip of the IJ catheter is in the SVC.  No pneumothorax. 2. Cardiac enlargement, atherosclerotic disease and pulmonary vascular congestion.   Electronically Signed   By: Signa Kellaylor  Stroud M.D.   On: 01/08/2014 23:52   Dg Abd Portable 1v  01/13/2014   CLINICAL DATA:  Generalized abdominal pain, nausea.  EXAM: PORTABLE ABDOMEN - 1 VIEW  COMPARISON:  January 08, 2014.  FINDINGS: The bowel gas pattern is normal. No radio-opaque calculi or other significant radiographic abnormality are seen.  IMPRESSION: No evidence of bowel obstruction or ileus.   Electronically Signed   By: Roque LiasJames  Green M.D.   On: 01/13/2014 18:18   Dg Fluoro Guide Cv Line-no Report  01/30/2014   CLINICAL DATA:    FLOURO GUIDE CV LINE  Fluoroscopy was utilized by the requesting physician.  No radiographic  interpretation.     Jeoffrey MassedGHIMIRE,Jaquane Boughner, MD  [facility] Pager:[phone]  If 7PM-7AM, please contact night-coverage [email or web address] Password [email or web address] 02/02/2014, 12:30 PM   LOS: 4 days

## 2014-02-02 NOTE — Progress Notes (Signed)
Physical Therapy Evaluation Patient Details Name: [name] MRN: [id number] DOB: May 25, 1934 Today's Date: 02/02/2014   History of Present Illness  Patient is a 79 yo female admitted 01/29/14 with weakness, AKI, FTT.  PMH:  ESRD on HD, HTN, COPD  Clinical Impression  Patient presents with problems listed below.  Will benefit from acute PT to maximize independence prior to discharge.  Recommend SNF for continued therapy to maximize independence prior [place]    Follow Up Recommendations SNF;Supervision/Assistance - 24 hour    Equipment Recommendations  None recommended by PT    Recommendations for Other Services       Precautions / Restrictions Precautions Precautions: Fall Restrictions Weight Bearing Restrictions: No      Mobility  Bed Mobility Overal bed mobility: Needs Assistance Bed Mobility: Rolling;Sidelying to Sit;Sit to Supine Rolling: Min guard Sidelying to sit: Min assist   Sit to supine: Min assist   General bed mobility comments: Verbal cues for technique.  Patient uses bed rail for rolling.  Assist to raise trunk to sitting position.  Patient sat EOB x 7 minutes with BUE support.  Patient feeling poorly, with increasing nausea.  Declined OOB. Returned to supine with assist to bring LE's onto bed.  Transfers                    Ambulation/Gait                Stairs            Wheelchair Mobility    Modified Rankin (Stroke Patients Only)       Balance                                             Pertinent Vitals/Pain Pain Assessment: Faces Faces Pain Scale: Hurts little more Pain Location: Rt shoulder Pain Descriptors / Indicators: Aching;Sore Pain Intervention(s): Repositioned    Home Living Family/patient expects to be discharged to:: Skilled nursing facility Living Arrangements: Children             Home Equipment: Dan HumphreysWalker - 2 wheels;Walker - 4  wheels;Tub bench;Bedside commode;Wheelchair - manual (O2 at home at night) Additional Comments: Per son, they are [place]      Prior Function Level of Independence: Needs assistance   Gait / Transfers Assistance Needed: Uses rollator for availability of seat.  ADL's / Homemaking Assistance Needed: Assist with ADL's        Hand Dominance   Dominant Hand: Right    Extremity/Trunk Assessment   Upper Extremity Assessment: Generalized weakness (Decreased shoulder ROM)           Lower Extremity Assessment: Generalized weakness      Cervical / Trunk Assessment: Kyphotic;Other exceptions  Communication   Communication: HOH  Cognition Arousal/Alertness: Lethargic (Nauseated following HD) Behavior During Therapy: Flat affect Overall Cognitive Status: Within Functional Limits for tasks assessed                      General Comments      Exercises        Assessment/Plan    PT Assessment Patient needs continued PT services  PT Diagnosis Difficulty walking;Generalized weakness;Acute pain   PT Problem List Decreased strength;Decreased activity tolerance;Decreased range  of motion;Decreased balance;Decreased mobility;Decreased knowledge of use of DME;Cardiopulmonary status limiting activity;Pain  PT Treatment Interventions DME instruction;Gait training;Functional mobility training;Therapeutic activities;Therapeutic exercise;Balance training;Patient/family education   PT Goals (Current goals can be found in the Care Plan section) Acute Rehab PT Goals Patient Stated Goal: None stated PT Goal Formulation: With patient/family Time For Goal Achievement: 02/09/14 Potential to Achieve Goals: Fair    Frequency Min 3X/week   Barriers to discharge        Co-evaluation               End of Session Equipment Utilized During Treatment: Oxygen Activity Tolerance: Patient limited by fatigue Patient left: in  bed;with call bell/phone within reach;with family/visitor present Nurse Communication: Mobility status         Time: [id number] PT Time Calculation (min) (ACUTE ONLY): 20 min   Charges:   PT Evaluation $Initial PT Evaluation Tier I: 1 Procedure PT Treatments $Therapeutic Activity: 8-22 mins   PT G Codes:          Vena AustriaDavis, Darrel Gloss H 02/02/2014, 4:06 PM Durenda HurtSusan H. Renaldo Fiddleravis, PT, [facility] Acute Rehab Services Pager [phone]

## 2014-02-03 LAB — IRON AND TIBC
Iron: 31 ug/dL — ABNORMAL LOW (ref 42–135)
Saturation Ratios: 17 % — ABNORMAL LOW (ref 20–55)
TIBC: 178 ug/dL — ABNORMAL LOW (ref 250–470)
UIBC: 147 ug/dL (ref 125–400)

## 2014-02-03 LAB — DIFFERENTIAL
Basophils Absolute: 0.2 10*3/uL — ABNORMAL HIGH (ref 0.0–0.1)
Basophils Relative: 2 % — ABNORMAL HIGH (ref 0–1)
EOS ABS: 0.8 10*3/uL — AB (ref 0.0–0.7)
Eosinophils Relative: 9 % — ABNORMAL HIGH (ref 0–5)
LYMPHS ABS: 1.7 10*3/uL (ref 0.7–4.0)
Lymphocytes Relative: 19 % (ref 12–46)
MONOS PCT: 13 % — AB (ref 3–12)
Monocytes Absolute: 1.2 10*3/uL — ABNORMAL HIGH (ref 0.1–1.0)
Neutro Abs: 5.2 10*3/uL (ref 1.7–7.7)
Neutrophils Relative %: 57 % (ref 43–77)

## 2014-02-03 LAB — RENAL FUNCTION PANEL
Albumin: 2.4 g/dL — ABNORMAL LOW (ref 3.5–5.2)
Anion gap: 13 (ref 5–15)
BUN: 28 mg/dL — AB (ref 6–23)
CHLORIDE: 100 meq/L (ref 96–112)
CO2: 26 meq/L (ref 19–32)
CREATININE: 4.26 mg/dL — AB (ref 0.50–1.10)
Calcium: 8.8 mg/dL (ref 8.4–10.5)
GFR calc Af Amer: 11 mL/min — ABNORMAL LOW (ref >90)
GFR calc non Af Amer: 9 mL/min — ABNORMAL LOW (ref >90)
Glucose, Bld: 90 mg/dL (ref 70–99)
Phosphorus: 4.3 mg/dL (ref 2.3–4.6)
Potassium: 3.8 mEq/L (ref 3.7–5.3)
Sodium: 139 mEq/L (ref 137–147)

## 2014-02-03 LAB — CBC
HCT: 28.2 % — ABNORMAL LOW (ref 36.0–46.0)
Hemoglobin: 8.5 g/dL — ABNORMAL LOW (ref 12.0–15.0)
MCH: 28.1 pg (ref 26.0–34.0)
MCHC: 30.1 g/dL (ref 30.0–36.0)
MCV: 93.1 fL (ref 78.0–100.0)
PLATELETS: 180 10*3/uL (ref 150–400)
RBC: 3.03 MIL/uL — ABNORMAL LOW (ref 3.87–5.11)
RDW: 15.7 % — ABNORMAL HIGH (ref 11.5–15.5)
WBC: 9.1 10*3/uL (ref 4.0–10.5)

## 2014-02-03 MED ORDER — DARBEPOETIN ALFA 150 MCG/0.3ML IJ SOSY
150.0000 ug | PREFILLED_SYRINGE | INTRAMUSCULAR | Status: DC
  Administered 2014-02-05: 150 ug via INTRAVENOUS
  Filled 2014-02-03 (×4): qty 0.3

## 2014-02-03 NOTE — Progress Notes (Signed)
PATIENT DETAILS Name: [name] Age: 79 y.o. Sex: female Date of Birth: 1934/03/28 Admit Date: 01/29/2014 Admitting Physician Benny LennertJoseph L Zammit, MD PCP:No primary care provider on file.  Brief Narrative Patient is a 79 year old female who was recently discharged from this hospital on 01/21/14, she presented with ARF of unclear etiology,she  briefly required HD, by discharge was improving, HD catheter was removed, and patient was discharged to SNF.Patient  Returned on 12/15 with worsening nausea, vomiting, diffuse abdominal pain. Labs showed further worsening in creatinine. Renal/VVS consulted,  hemodialysis catheter placed  and underwent hemodialysis. She also underwent a renal bx-results are currently pending. Etiology of renal failure seems unclear at present, at present not sure whether she will regain renal function. Nephrology continues to follow.  Subjective: Eating this am-when I walked in. No nausea/abd pain.  Assessment/Plan: Principal Problem:  Acute renal failure: Etiology not clear, recently admitted to this hospital and underwent hemodialysis 2, by discharge creatinine was improving, hemodialysis catheter was removed and patient subsequently discharged to SNF. Returned on 12/15 with worsening nausea, vomiting, diffuse abdominal pain. Further worsening in creatinine. Renal/VVS consulted,  hemodialysis catheter placed  and underwent hemodialysis X 2 so far. Etiology of renal failure not evident, but patient does have some eosinophilia, concern for AIN/Churg Strauss-underwent renal biopsy 12/18-pending results.Still awaiting return of renal function before declaring patient at ESRD.  Active Problems:   Nausea vomiting and abdominal pain:Mostly resolved but continues intermittently Continue Supportive care.Likely secondary to above. Abdominal ultrasound shows gallstones,suspect since abd pain was generalizednot likely related to cholelithiasis.Lipase levels normal on  admission.    Hypothyroidism: Continue with levothyroxine    GERD:continue PPI    COPD: Lungs clear, c/w prn bronchodilators.    History of hypertension: Previously on antihypertensives, currently blood pressure controlled-infact soft use of any antihypertensives. Suspect will need starting Midodrine.Follow BP.    Failure to thrive syndrome: Recurrent hospitalization, worsening renal failure. Obtain PT/nutrition eval. Follow clinically.    Severe malnutrition in the context of acute illness or injury:c/w supplements  Disposition: Remain inpatient-suspect will need SNF on discharge  Antibiotics: None  Anti-infectives    None      DVT Prophylaxis:  prophylatic heparin  Code Status: Full code  Family Communication Son-Keith at bedside  Procedures:  NONE  CONSULTS:  nephrology and vascular surgery  MEDICATIONS: Scheduled Meds: . aspirin EC  81 mg Oral Daily  . feeding supplement (NEPRO CARB STEADY)  237 mL Oral BID BM  . heparin subcutaneous  5,000 Units Subcutaneous 3 times per day  . latanoprost  1 drop Both Eyes QHS  . levothyroxine  25 mcg Oral QAC breakfast  . loratadine  10 mg Oral Daily  . mometasone-formoterol  2 puff Inhalation BID  . pantoprazole  40 mg Oral Q1200  . pilocarpine  1 drop Right Eye QID  . senna-docusate  1 tablet Oral BID  . sodium chloride  3 mL Intravenous Q12H   Continuous Infusions: . sodium chloride 10 mL/hr at 01/30/14 1200  . sodium chloride 10 mL/hr at 02/01/14 1001  . sodium chloride     PRN Meds:.sodium chloride, acetaminophen **OR** acetaminophen, acetaminophen, albuterol, ondansetron **OR** ondansetron (ZOFRAN) IV, oxyCODONE-acetaminophen, sodium chloride    PHYSICAL EXAM: Vital signs in last 24 hours: Filed Vitals:   02/02/14 2116 02/02/14 2138 02/03/14 0500 02/03/14 0956  BP:  120/45 91/63 99/61   Pulse:  126 68 113  Temp:  98.5 F (36.9 C) 98.2 F (  36.8 C) 97.7 F (36.5 C)  TempSrc:  Oral Oral Oral  Resp:   20 18 19   Height:      Weight:      SpO2: 95% 95% 94% 99%    Weight change: 0.4 kg (14.1 oz) Filed Weights   02/01/14 2021 02/02/14 0656 02/02/14 1001  Weight: 82.6 kg (182 lb 1.6 oz) 81.8 kg (180 lb 5.4 oz) 83 kg (182 lb 15.7 oz)   Body mass index is 27.83 kg/(m^2).   Gen Exam: Awake and alert with clear speech.   Neck: Supple, No JVD.   Chest: B/L Clear.  No rales or rhochi CVS:S1S2 Regular, no murmurs. Abdomen: soft, BS +, non tender, non distended.  Extremities: no edema, lower extremities warm to touch. Neurologic: Non Focal.   Skin: No Rash.   Wounds: N/A.    Intake/Output from previous day:  Intake/Output Summary (Last 24 hours) at 02/03/14 1001 Last data filed at 02/03/14 0900  Gross per 24 hour  Intake    120 ml  Output      0 ml  Net    120 ml     LAB RESULTS: CBC  Recent Labs Lab 01/29/14 0810  01/30/14 0553 01/31/14 0408 02/01/14 0438 02/01/14 1710 02/02/14 0440 02/03/14 0446  WBC 11.5*  < > 8.6 10.0 9.7 11.6* 11.4* 9.1  HGB 11.0*  < > 9.8* 10.2* 10.0* 9.5* 9.0* 8.5*  HCT 34.4*  < > 31.5* 32.5* 30.8* 30.6* 29.1* 28.2*  PLT 303  < > 270 161 230 186 177 180  MCV 90.8  < > 91.3 92.9 93.3 91.9 92.4 93.1  MCH 29.0  < > 28.4 29.1 30.3 28.5 28.6 28.1  MCHC 32.0  < > 31.1 31.4 32.5 31.0 30.9 30.1  RDW 15.8*  < > 15.9* 15.9* 15.7* 15.6* 15.6* 15.7*  LYMPHSABS 2.4  --  1.2  --   --   --   --  1.7  MONOABS 1.1*  --  1.1*  --   --   --   --  1.2*  EOSABS 0.9*  --  0.8*  --   --   --   --  0.8*  BASOSABS 0.2*  --  0.1  --   --   --   --  0.2*  < > = values in this interval not displayed.  Chemistries   Recent Labs Lab 01/30/14 1753 01/31/14 0408 02/01/14 0500 02/02/14 0440 02/03/14 0446  NA 140 141 141 136* 139  K 3.3* 3.5* 3.7 3.9 3.8  CL 103 102 101 98 100  CO2 21 25 23 21 26   GLUCOSE 141* 119* 95 101* 90  BUN 51* 24* 39* 51* 28*  CREATININE 6.57* 4.04* 5.85* 7.12* 4.26*  CALCIUM 8.7 8.5 9.2 9.6 8.8  MG 2.2  --   --   --   --      CBG: No results for input(s): GLUCAP in the last 168 hours.  GFR Estimated Creatinine Clearance: 12.5 mL/min (by C-G formula based on Cr of 4.26).  Coagulation profile  Recent Labs Lab 01/30/14 0921  INR 1.13    Cardiac Enzymes No results for input(s): CKMB, TROPONINI, MYOGLOBIN in the last 168 hours.  Invalid input(s): CK  Invalid input(s): POCBNP No results for input(s): DDIMER in the last 72 hours. No results for input(s): HGBA1C in the last 72 hours. No results for input(s): CHOL, HDL, LDLCALC, TRIG, CHOLHDL, LDLDIRECT in the last 72 hours. No results for input(s): TSH,  T4TOTAL, T3FREE, THYROIDAB in the last 72 hours.  Invalid input(s): FREET3 No results for input(s): VITAMINB12, FOLATE, FERRITIN, TIBC, IRON, RETICCTPCT in the last 72 hours. No results for input(s): LIPASE, AMYLASE in the last 72 hours.  Urine Studies No results for input(s): UHGB, CRYS in the last 72 hours.  Invalid input(s): UACOL, UAPR, USPG, UPH, UTP, UGL, UKET, UBIL, UNIT, UROB, ULEU, UEPI, UWBC, URBC, UBAC, CAST, UCOM, BILUA  MICROBIOLOGY: No results found for this or any previous visit (from the past 240 hour(s)).  RADIOLOGY STUDIES/RESULTS: [place] Abdomen Complete  01/29/2014   CLINICAL DATA:  Abdominal pain, medical renal disease, hypertension  EXAM: ULTRASOUND ABDOMEN COMPLETE  COMPARISON:  Renal ultrasound of January 08, 2014  FINDINGS: Gallbladder: The gallbladder is adequately distended and contains multiple echogenic mobile shadowing stones. There is no gallbladder wall thickening, pericholecystic fluid, or positive sonographic Murphy's sign.  Common bile duct: Diameter: 6.5 mm  Liver: No focal lesion identified. Within normal limits in parenchymal echogenicity.  IVC: No abnormality visualized.  Pancreas: Largely obscured by bowel gas.  Spleen: Size and appearance within normal limits.  Right Kidney: Length: 13.8 cm. Echogenicity of the cortex is mildly increased. No mass or hydronephrosis  visualized. There is a midpole cyst measuring 1.2 cm in diameter.  Left Kidney: Length: 12.5 cm. Echogenicity of the cortex is mildly increased. No mass or hydronephrosis visualized.  Abdominal aorta: No aneurysm visualized.  Other findings: None.  IMPRESSION: 1. Gallstones without evidence of acute cholecystitis. 2. Increased renal cortical echotexture consistent with medical renal disease. This is similar to that seen previously.   Electronically Signed   By: David  SwazilandJordan   On: 01/29/2014 13:26   [place] Renal  01/08/2014   CLINICAL DATA:  Acute renal failure  EXAM: RENAL/URINARY TRACT ULTRASOUND COMPLETE  COMPARISON:  None.  FINDINGS: Right Kidney:  Length: 15 cm. Increased renal cortical echogenicity. No mass or hydronephrosis visualized.  Left Kidney:  Length: 12.3 cm. Increased renal cortical echogenicity. No mass or hydronephrosis visualized.  Bladder:  Appears normal for degree of bladder distention.  IMPRESSION: 1. No obstructive uropathy. 2. Increased renal cortical echogenicity as can be seen with medical renal disease.   Electronically Signed   By: Elige KoHetal  Patel   On: 01/08/2014 21:15   [place] Biopsy-no Radiologist  02/01/2014   CLINICAL DATA:    Ultrasound was utilized for biopsy by the requesting physician.    Dg Chest Port 1 View  01/30/2014   CLINICAL DATA:  Dialysis catheter insertion.  EXAM: PORTABLE CHEST - 1 VIEW  COMPARISON:  01/29/2014.  FINDINGS: Trachea is midline. Heart size stable. Right IJ dialysis catheter tips project over the SVC. Biapical pleural thickening. Question developing airspace disease in the left lower lobe. Lungs are otherwise clear. No pneumothorax. No definite pleural fluid.  IMPRESSION: 1. Right IJ dialysis catheter insertion without pneumothorax. 2. Question developing left lower lobe airspace disease.   Electronically Signed   By: Leanna BattlesMelinda  Blietz M.D.   On: 01/30/2014 13:48   Dg Chest Port 1 View  01/29/2014   CLINICAL DATA:  New onset of nausea and shortness of  Breath  EXAM: PORTABLE CHEST - 1 VIEW  COMPARISON:  01/09/2014  FINDINGS: Cardiomegaly again noted. Atherosclerotic calcifications of thoracic aorta. No acute infiltrate or pulmonary edema. Mild basilar atelectasis.  IMPRESSION: No active disease.  Mild basilar atelectasis.   Electronically Signed   By: Natasha MeadLiviu  Pop M.D.   On: 01/29/2014 08:38   Dg Chest Port 1 View  01/09/2014  CLINICAL DATA:  Hypoxia  EXAM: PORTABLE CHEST - 1 VIEW  COMPARISON:  01/08/2014  FINDINGS: Cardiomediastinal silhouette is stable. Mild elevation of the left hemidiaphragm. There is central mild vascular congestion and mild interstitial prominence suspicious for mild interstitial edema. Small left pleural effusion with left basilar atelectasis or infiltrate. Right IJ sheath is unchanged in position.  IMPRESSION: Mild vascular congestion mild interstitial prominence suspicious for mild interstitial edema. Small left pleural effusion with left basilar atelectasis or infiltrate.   Electronically Signed   By: Natasha Mead M.D.   On: 01/09/2014 10:45   Dg Chest Port 1 View  01/08/2014   CLINICAL DATA:  Evaluate central line placement.  EXAM: PORTABLE CHEST - 1 VIEW  COMPARISON:  01/08/2014  FINDINGS: There is a right IJ catheter with tip in the projection of the SVC. No pneumothorax identified. Mild cardiac enlargement. There is calcified plaque identified within the aortic arch. Pulmonary vascular congestion is noted. There is bibasilar atelectasis.  IMPRESSION: 1. Tip of the IJ catheter is in the SVC.  No pneumothorax. 2. Cardiac enlargement, atherosclerotic disease and pulmonary vascular congestion.   Electronically Signed   By: Signa Kell M.D.   On: 01/08/2014 23:52   Dg Abd Portable 1v  01/13/2014   CLINICAL DATA:  Generalized abdominal pain, nausea.  EXAM: PORTABLE ABDOMEN - 1 VIEW  COMPARISON:  January 08, 2014.  FINDINGS: The bowel gas pattern is normal. No radio-opaque calculi or other significant radiographic abnormality  are seen.  IMPRESSION: No evidence of bowel obstruction or ileus.   Electronically Signed   By: Roque Lias M.D.   On: 01/13/2014 18:18   Dg Fluoro Guide Cv Line-no Report  01/30/2014   CLINICAL DATA:    FLOURO GUIDE CV LINE  Fluoroscopy was utilized by the requesting physician.  No radiographic  interpretation.     Jeoffrey Massed, MD  [facility] Pager:[phone]  If 7PM-7AM, please contact night-coverage [email or web address] Password [email or web address] 02/03/2014, 10:01 AM   LOS: 5 days

## 2014-02-03 NOTE — Progress Notes (Signed)
Subjective: Interval History: has complaints neck gets stiff.  Objective: Vital signs in last 24 hours: Temp:  [97.7 F (36.5 C)-98.5 F (36.9 C)] 97.7 F (36.5 C) (12/20 0956) Pulse Rate:  [68-126] 113 (12/20 0956) Resp:  [18-21] 19 (12/20 0956) BP: (90-120)/(45-63) 99/61 mmHg (12/20 0956) SpO2:  [94 %-99 %] 99 % (12/20 0956) Weight change: 0.4 kg (14.1 oz)  Intake/Output from previous day: 12/19 0701 - 12/20 0700 In: 0  Out: -707  Intake/Output this shift: Total I/O In: 120 [P.O.:120] Out: -   General appearance: cooperative, moderately obese and pale Resp: diminished breath sounds bilaterally and wheezes bilaterally Chest wall: RIJ PC Cardio: S1, S2 normal and systolic murmur: systolic ejection 2/6, blowing at apex GI: obese, pos bs,liver down 5 cm Extremities: edema 1+  Lab Results:  Recent Labs  02/02/14 0440 02/03/14 0446  WBC 11.4* 9.1  HGB 9.0* 8.5*  HCT 29.1* 28.2*  PLT 177 180   BMET:  Recent Labs  02/02/14 0440 02/03/14 0446  NA 136* 139  K 3.9 3.8  CL 98 100  CO2 21 26  GLUCOSE 101* 90  BUN 51* 28*  CREATININE 7.12* 4.26*  CALCIUM 9.6 8.8   No results for input(s): PTH in the last 72 hours. Iron Studies: No results for input(s): IRON, TIBC, TRANSFERRIN, FERRITIN in the last 72 hours.  Studies/Results: [place] Biopsy-no Radiologist  02/01/2014   CLINICAL DATA:    Ultrasound was utilized for biopsy by the requesting physician.     I have reviewed the patient's current medications.  Assessment/Plan: 1 AKI ?? Urine vol.  Renal bx pending to eval cause.  Little function, check chem in am as today reflects HD yest.  Vol ok. 2 Obesity 3 COPD per primary 4 Anemia will use epo 5 debill 6 GERD 7 DJD P measure urine, mobilize, epo, follow chem, decide HD    LOS: 5 days   Kalijah Westfall L 02/03/2014,10:08 AM

## 2014-02-03 NOTE — Progress Notes (Signed)
Found pt on 2 lpm Grubbs, pt requested VM for comfort. Placed pt on 28%, no distress noted.  Pt states she is comfortable.

## 2014-02-04 LAB — RENAL FUNCTION PANEL
Albumin: 2.5 g/dL — ABNORMAL LOW (ref 3.5–5.2)
Anion gap: 14 (ref 5–15)
BUN: 35 mg/dL — ABNORMAL HIGH (ref 6–23)
CO2: 26 meq/L (ref 19–32)
Calcium: 9.6 mg/dL (ref 8.4–10.5)
Chloride: 99 mEq/L (ref 96–112)
Creatinine, Ser: 4.78 mg/dL — ABNORMAL HIGH (ref 0.50–1.10)
GFR calc non Af Amer: 8 mL/min — ABNORMAL LOW (ref >90)
GFR, EST AFRICAN AMERICAN: 9 mL/min — AB (ref >90)
GLUCOSE: 98 mg/dL (ref 70–99)
POTASSIUM: 3.5 meq/L — AB (ref 3.7–5.3)
Phosphorus: 4.4 mg/dL (ref 2.3–4.6)
SODIUM: 139 meq/L (ref 137–147)

## 2014-02-04 MED ORDER — ONDANSETRON HCL 4 MG/2ML IJ SOLN
4.0000 mg | Freq: Three times a day (TID) | INTRAMUSCULAR | Status: DC
  Administered 2014-02-04 – 2014-02-13 (×27): 4 mg via INTRAVENOUS
  Filled 2014-02-04 (×23): qty 2

## 2014-02-04 MED ORDER — LACTULOSE 10 GM/15ML PO SOLN
30.0000 g | Freq: Two times a day (BID) | ORAL | Status: DC | PRN
  Administered 2014-02-04: 30 g via ORAL
  Filled 2014-02-04 (×3): qty 45

## 2014-02-04 MED ORDER — POLYETHYLENE GLYCOL 3350 17 G PO PACK
17.0000 g | PACK | Freq: Every day | ORAL | Status: DC
  Administered 2014-02-04 – 2014-02-13 (×9): 17 g via ORAL
  Filled 2014-02-04 (×10): qty 1

## 2014-02-04 NOTE — Progress Notes (Signed)
PATIENT DETAILS Name: [name] Age: 79 y.o. Sex: female Date of Birth: Oct 04, 1934 Admit Date: 01/29/2014 Admitting Physician Benny Lennert, MD PCP:No primary care provider on file.  Brief Narrative Patient is a 79 year old female who was recently discharged from this hospital on 01/21/14, she presented with ARF of unclear etiology,she briefly required HD, by discharge was improving, HD catheter was removed, and patient was discharged to SNF.Patient  Returned on 12/15 with worsening nausea, vomiting, diffuse abdominal pain. Labs showed further worsening in creatinine. Renal/VVS consulted, hemodialysis catheter placed and underwent hemodialysis. She also underwent a renal bx-results are currently pending. Etiology of renal failure seems unclear at present, at present not sure whether she will regain renal function. Nephrology continues to follow.  Subjective: Nauseous again this am. No BM over past few days.  Assessment/Plan: Principal Problem:  Acute renal failure: Etiology not clear, recently admitted to this hospital and underwent hemodialysis 2, by discharge creatinine was improving, hemodialysis catheter was removed and patient subsequently discharged to SNF. Returned on 12/15 with worsening nausea, vomiting, diffuse abdominal pain. Further worsening in creatinine. Renal/VVS consulted,  hemodialysis catheter placed  and underwent hemodialysis X 2 so far. Etiology of renal failure not evident, but patient does have some eosinophilia, concern for AIN/Churg Strauss-underwent renal biopsy 12/18-pending results.Still awaiting return of renal function before declaring patient at ESRD.Renal continues to follow.  Active Problems:   Nausea vomiting and abdominal pain:Mostly resolved but continues intermittently .Likely secondary to above. Abdominal ultrasound shows gallstones,suspect since abd pain was generalizednot likely related to cholelithiasis.Lipase levels normal on  admission.Will start scheduled Zofran, start bowel regimen-to see if better following BM-if continues to persists-will check gastric emptying scan at some point. Belly exam is benign.    Anemia of Renal Disease: on Aranesp    Hypothyroidism: Continue with levothyroxine    GERD:continue PPI    COPD: Lungs clear, c/w prn bronchodilators.    History of hypertension: Previously on antihypertensives, currently blood pressure controlled-infact soft without the use of any antihypertensives. Suspect will need starting Midodrine.Follow BP.    Failure to thrive syndrome: Recurrent hospitalization, worsening renal failure. Obtain PT/nutrition eval. Follow clinically.    Severe malnutrition in the context of acute illness or injury:c/w supplements  Disposition: Remain inpatient-suspect will need SNF on discharge  Antibiotics: None  Anti-infectives    None      DVT Prophylaxis:  prophylatic heparin  Code Status: Full code  Family Communication Son-Keith at bedside  Procedures:  NONE  CONSULTS:  nephrology and vascular surgery  MEDICATIONS: Scheduled Meds: . aspirin EC  81 mg Oral Daily  . [START ON 02/05/2014] darbepoetin (ARANESP) injection - DIALYSIS  150 mcg Intravenous Q Tue-HD  . feeding supplement (NEPRO CARB STEADY)  237 mL Oral BID BM  . heparin subcutaneous  5,000 Units Subcutaneous 3 times per day  . latanoprost  1 drop Both Eyes QHS  . levothyroxine  25 mcg Oral QAC breakfast  . loratadine  10 mg Oral Daily  . mometasone-formoterol  2 puff Inhalation BID  . ondansetron (ZOFRAN) IV  4 mg Intravenous TID AC  . pantoprazole  40 mg Oral Q1200  . pilocarpine  1 drop Right Eye QID  . polyethylene glycol  17 g Oral Daily  . senna-docusate  1 tablet Oral BID  . sodium chloride  3 mL Intravenous Q12H   Continuous Infusions: . sodium chloride 10 mL/hr at 01/30/14 1200  . sodium chloride 10 mL/hr  at 02/01/14 1001  . sodium chloride     PRN Meds:.sodium chloride,  acetaminophen **OR** acetaminophen, acetaminophen, albuterol, lactulose, ondansetron **OR** ondansetron (ZOFRAN) IV, oxyCODONE-acetaminophen, sodium chloride    PHYSICAL EXAM: Vital signs in last 24 hours: Filed Vitals:   02/03/14 2100 02/04/14 0500 02/04/14 0831 02/04/14 0948  BP: 101/51 98/54  107/62  Pulse: 111 122  113  Temp: 98.1 F (36.7 C) 98 F (36.7 C)  97.9 F (36.6 C)  TempSrc: Oral Oral  Oral  Resp: 18 20  18   Height:      Weight: 83.734 kg (184 lb 9.6 oz)     SpO2: 95% 97% 96% 98%    Weight change: 0.734 kg (1 lb 9.9 oz) Filed Weights   02/02/14 0656 02/02/14 1001 02/03/14 2100  Weight: 81.8 kg (180 lb 5.4 oz) 83 kg (182 lb 15.7 oz) 83.734 kg (184 lb 9.6 oz)   Body mass index is 28.07 kg/(m^2).   Gen Exam: Awake and alert with clear speech.   Neck: Supple, No JVD.   Chest: B/L Clear.  No rales or rhochi CVS:S1S2 Regular, no murmurs. Abdomen: soft, BS +, non tender, non distended.  Extremities: no edema, lower extremities warm to touch. Neurologic: Non Focal.   Skin: No Rash.   Wounds: N/A.    Intake/Output from previous day:  Intake/Output Summary (Last 24 hours) at 02/04/14 1014 Last data filed at 02/04/14 0900  Gross per 24 hour  Intake    480 ml  Output    600 ml  Net   -120 ml     LAB RESULTS: CBC  Recent Labs Lab 01/29/14 0810  01/30/14 0553 01/31/14 0408 02/01/14 0438 02/01/14 1710 02/02/14 0440 02/03/14 0446  WBC 11.5*  < > 8.6 10.0 9.7 11.6* 11.4* 9.1  HGB 11.0*  < > 9.8* 10.2* 10.0* 9.5* 9.0* 8.5*  HCT 34.4*  < > 31.5* 32.5* 30.8* 30.6* 29.1* 28.2*  PLT 303  < > 270 161 230 186 177 180  MCV 90.8  < > 91.3 92.9 93.3 91.9 92.4 93.1  MCH 29.0  < > 28.4 29.1 30.3 28.5 28.6 28.1  MCHC 32.0  < > 31.1 31.4 32.5 31.0 30.9 30.1  RDW 15.8*  < > 15.9* 15.9* 15.7* 15.6* 15.6* 15.7*  LYMPHSABS 2.4  --  1.2  --   --   --   --  1.7  MONOABS 1.1*  --  1.1*  --   --   --   --  1.2*  EOSABS 0.9*  --  0.8*  --   --   --   --  0.8*  BASOSABS  0.2*  --  0.1  --   --   --   --  0.2*  < > = values in this interval not displayed.  Chemistries   Recent Labs Lab 01/30/14 1753 01/31/14 0408 02/01/14 0500 02/02/14 0440 02/03/14 0446 02/04/14 0450  NA 140 141 141 136* 139 139  K 3.3* 3.5* 3.7 3.9 3.8 3.5*  CL 103 102 101 98 100 99  CO2 21 25 23 21 26 26   GLUCOSE 141* 119* 95 101* 90 98  BUN 51* 24* 39* 51* 28* 35*  CREATININE 6.57* 4.04* 5.85* 7.12* 4.26* 4.78*  CALCIUM 8.7 8.5 9.2 9.6 8.8 9.6  MG 2.2  --   --   --   --   --     CBG: No results for input(s): GLUCAP in the last 168 hours.  GFR Estimated  Creatinine Clearance: 11.2 mL/min (by C-G formula based on Cr of 4.78).  Coagulation profile  Recent Labs Lab 01/30/14 0921  INR 1.13    Cardiac Enzymes No results for input(s): CKMB, TROPONINI, MYOGLOBIN in the last 168 hours.  Invalid input(s): CK  Invalid input(s): POCBNP No results for input(s): DDIMER in the last 72 hours. No results for input(s): HGBA1C in the last 72 hours. No results for input(s): CHOL, HDL, LDLCALC, TRIG, CHOLHDL, LDLDIRECT in the last 72 hours. No results for input(s): TSH, T4TOTAL, T3FREE, THYROIDAB in the last 72 hours.  Invalid input(s): FREET3  Recent Labs  02/03/14 0446  TIBC 178*  IRON 31*   No results for input(s): LIPASE, AMYLASE in the last 72 hours.  Urine Studies No results for input(s): UHGB, CRYS in the last 72 hours.  Invalid input(s): UACOL, UAPR, USPG, UPH, UTP, UGL, UKET, UBIL, UNIT, UROB, ULEU, UEPI, UWBC, URBC, UBAC, CAST, UCOM, BILUA  MICROBIOLOGY: No results found for this or any previous visit (from the past 240 hour(s)).  RADIOLOGY STUDIES/RESULTS: [place] Abdomen Complete  01/29/2014   CLINICAL DATA:  Abdominal pain, medical renal disease, hypertension  EXAM: ULTRASOUND ABDOMEN COMPLETE  COMPARISON:  Renal ultrasound of January 08, 2014  FINDINGS: Gallbladder: The gallbladder is adequately distended and contains multiple echogenic mobile shadowing  stones. There is no gallbladder wall thickening, pericholecystic fluid, or positive sonographic Murphy's sign.  Common bile duct: Diameter: 6.5 mm  Liver: No focal lesion identified. Within normal limits in parenchymal echogenicity.  IVC: No abnormality visualized.  Pancreas: Largely obscured by bowel gas.  Spleen: Size and appearance within normal limits.  Right Kidney: Length: 13.8 cm. Echogenicity of the cortex is mildly increased. No mass or hydronephrosis visualized. There is a midpole cyst measuring 1.2 cm in diameter.  Left Kidney: Length: 12.5 cm. Echogenicity of the cortex is mildly increased. No mass or hydronephrosis visualized.  Abdominal aorta: No aneurysm visualized.  Other findings: None.  IMPRESSION: 1. Gallstones without evidence of acute cholecystitis. 2. Increased renal cortical echotexture consistent with medical renal disease. This is similar to that seen previously.   Electronically Signed   By: David  SwazilandJordan   On: 01/29/2014 13:26   [place] Renal  01/08/2014   CLINICAL DATA:  Acute renal failure  EXAM: RENAL/URINARY TRACT ULTRASOUND COMPLETE  COMPARISON:  None.  FINDINGS: Right Kidney:  Length: 15 cm. Increased renal cortical echogenicity. No mass or hydronephrosis visualized.  Left Kidney:  Length: 12.3 cm. Increased renal cortical echogenicity. No mass or hydronephrosis visualized.  Bladder:  Appears normal for degree of bladder distention.  IMPRESSION: 1. No obstructive uropathy. 2. Increased renal cortical echogenicity as can be seen with medical renal disease.   Electronically Signed   By: Elige KoHetal  Patel   On: 01/08/2014 21:15   [place] Biopsy-no Radiologist  02/01/2014   CLINICAL DATA:    Ultrasound was utilized for biopsy by the requesting physician.    Dg Chest Port 1 View  01/30/2014   CLINICAL DATA:  Dialysis catheter insertion.  EXAM: PORTABLE CHEST - 1 VIEW  COMPARISON:  01/29/2014.  FINDINGS: Trachea is midline. Heart size stable. Right IJ dialysis catheter tips project over the  SVC. Biapical pleural thickening. Question developing airspace disease in the left lower lobe. Lungs are otherwise clear. No pneumothorax. No definite pleural fluid.  IMPRESSION: 1. Right IJ dialysis catheter insertion without pneumothorax. 2. Question developing left lower lobe airspace disease.   Electronically Signed   By: Leanna BattlesMelinda  Blietz M.D.   On:  01/30/2014 13:48   Dg Chest Port 1 View  01/29/2014   CLINICAL DATA:  New onset of nausea and shortness of Breath  EXAM: PORTABLE CHEST - 1 VIEW  COMPARISON:  01/09/2014  FINDINGS: Cardiomegaly again noted. Atherosclerotic calcifications of thoracic aorta. No acute infiltrate or pulmonary edema. Mild basilar atelectasis.  IMPRESSION: No active disease.  Mild basilar atelectasis.   Electronically Signed   By: Natasha MeadLiviu  Pop M.D.   On: 01/29/2014 08:38   Dg Chest Port 1 View  01/09/2014   CLINICAL DATA:  Hypoxia  EXAM: PORTABLE CHEST - 1 VIEW  COMPARISON:  01/08/2014  FINDINGS: Cardiomediastinal silhouette is stable. Mild elevation of the left hemidiaphragm. There is central mild vascular congestion and mild interstitial prominence suspicious for mild interstitial edema. Small left pleural effusion with left basilar atelectasis or infiltrate. Right IJ sheath is unchanged in position.  IMPRESSION: Mild vascular congestion mild interstitial prominence suspicious for mild interstitial edema. Small left pleural effusion with left basilar atelectasis or infiltrate.   Electronically Signed   By: Natasha MeadLiviu  Pop M.D.   On: 01/09/2014 10:45   Dg Chest Port 1 View  01/08/2014   CLINICAL DATA:  Evaluate central line placement.  EXAM: PORTABLE CHEST - 1 VIEW  COMPARISON:  01/08/2014  FINDINGS: There is a right IJ catheter with tip in the projection of the SVC. No pneumothorax identified. Mild cardiac enlargement. There is calcified plaque identified within the aortic arch. Pulmonary vascular congestion is noted. There is bibasilar atelectasis.  IMPRESSION: 1. Tip of the IJ  catheter is in the SVC.  No pneumothorax. 2. Cardiac enlargement, atherosclerotic disease and pulmonary vascular congestion.   Electronically Signed   By: Signa Kellaylor  Stroud M.D.   On: 01/08/2014 23:52   Dg Abd Portable 1v  01/13/2014   CLINICAL DATA:  Generalized abdominal pain, nausea.  EXAM: PORTABLE ABDOMEN - 1 VIEW  COMPARISON:  January 08, 2014.  FINDINGS: The bowel gas pattern is normal. No radio-opaque calculi or other significant radiographic abnormality are seen.  IMPRESSION: No evidence of bowel obstruction or ileus.   Electronically Signed   By: Roque LiasJames  Green M.D.   On: 01/13/2014 18:18   Dg Fluoro Guide Cv Line-no Report  01/30/2014   CLINICAL DATA:    FLOURO GUIDE CV LINE  Fluoroscopy was utilized by the requesting physician.  No radiographic  interpretation.     Jeoffrey MassedGHIMIRE,Katharine Rochefort, MD  [facility] Pager:[phone]  If 7PM-7AM, please contact night-coverage [email or web address] Password [email or web address] 02/04/2014, 10:14 AM   LOS: 6 days

## 2014-02-04 NOTE — Progress Notes (Signed)
Assessment/Plan: 1 AKI ?? Urine vol. Renal bx pending to eval cause.  Hold on HD today. 2 Obesity 3 COPD per primary  Subjective: Interval History: no uremic s/s  Objective: Vital signs in last 24 hours: Temp:  [97.5 F (36.4 C)-98.1 F (36.7 C)] 97.9 F (36.6 C) (12/21 0948) Pulse Rate:  [111-122] 113 (12/21 0948) Resp:  [18-20] 18 (12/21 0948) BP: (98-112)/(51-62) 107/62 mmHg (12/21 0948) SpO2:  [95 %-98 %] 98 % (12/21 0948) FiO2 (%):  [28 %] 28 % (12/20 2016) Weight:  [83.734 kg (184 lb 9.6 oz)] 83.734 kg (184 lb 9.6 oz) (12/20 2100) Weight change: 0.734 kg (1 lb 9.9 oz)  Intake/Output from previous day: 12/20 0701 - 12/21 0700 In: 360 [P.O.:360] Out: 800 [Urine:800] Intake/Output this shift: Total I/O In: 240 [P.O.:240] Out: -   General appearance: alert and cooperative  Lungs clear  Cor tachy Abd s  Lab Results:  Recent Labs  02/02/14 0440 02/03/14 0446  WBC 11.4* 9.1  HGB 9.0* 8.5*  HCT 29.1* 28.2*  PLT 177 180   BMET:  Recent Labs  02/03/14 0446 02/04/14 0450  NA 139 139  K 3.8 3.5*  CL 100 99  CO2 26 26  GLUCOSE 90 98  BUN 28* 35*  CREATININE 4.26* 4.78*  CALCIUM 8.8 9.6   No results for input(s): PTH in the last 72 hours. Iron Studies:  Recent Labs  02/03/14 0446  IRON 31*  TIBC 178*   Studies/Results: No results found.  Scheduled: . aspirin EC  81 mg Oral Daily  . [START ON 02/05/2014] darbepoetin (ARANESP) injection - DIALYSIS  150 mcg Intravenous Q Tue-HD  . feeding supplement (NEPRO CARB STEADY)  237 mL Oral BID BM  . heparin subcutaneous  5,000 Units Subcutaneous 3 times per day  . latanoprost  1 drop Both Eyes QHS  . levothyroxine  25 mcg Oral QAC breakfast  . loratadine  10 mg Oral Daily  . mometasone-formoterol  2 puff Inhalation BID  . ondansetron (ZOFRAN) IV  4 mg Intravenous TID AC  . pantoprazole  40 mg Oral Q1200  . pilocarpine  1 drop Right Eye QID  . polyethylene glycol  17 g Oral Daily  . senna-docusate  1  tablet Oral BID  . sodium chloride  3 mL Intravenous Q12H     LOS: 6 days   Marcine Gadway C 02/04/2014,10:47 AM

## 2014-02-04 NOTE — Progress Notes (Signed)
Physical Therapy Treatment Patient Details Name: Daymon Larsenellie Biswas MRN: [id number] DOB: 1934-07-03 Today's Date: 02/04/2014    History of Present Illness Patient is a 79 yo female admitted 01/29/14 with weakness, AKI, FTT.  PMH:  ESRD on HD, HTN, COPD    PT Comments    Patient making slow gains with mobility and gait.  Follow Up Recommendations  SNF;Supervision/Assistance - 24 hour     Equipment Recommendations  None recommended by PT    Recommendations for Other Services       Precautions / Restrictions Precautions Precautions: Fall Restrictions Weight Bearing Restrictions: No    Mobility  Bed Mobility Overal bed mobility: Needs Assistance Bed Mobility: Rolling;Sidelying to Sit;Sit to Supine Rolling: Supervision Sidelying to sit: Min guard   Sit to supine: Min guard   General bed mobility comments: Verbal cues for technique.  Patient uses bedrail for mobility.  Assist for safety only.  Requires increased time.  Transfers Overall transfer level: Needs assistance Equipment used: Rolling walker (2 wheeled) Transfers: Sit to/from Stand Sit to Stand: Min assist         General transfer comment: Verbal cues for hand placement and technique.  Assist to rise to standing and for balance initially.  Ambulation/Gait Ambulation/Gait assistance: Min assist Ambulation Distance (Feet): 30 Feet Assistive device: Rolling walker (2 wheeled) Gait Pattern/deviations: Step-through pattern;Decreased stride length;Shuffle;Trunk flexed Gait velocity: Decreased Gait velocity interpretation: Below normal speed for age/gender General Gait Details: Verbal cues for safe use of RW, especially during turns.  Assist for balance during turns.  Patient fatigues quickly, limiting distance.   Stairs            Wheelchair Mobility    Modified Rankin (Stroke Patients Only)       Balance           Standing balance support: Bilateral upper extremity supported Standing  balance-Leahy Scale: Poor                      Cognition Arousal/Alertness: Awake/alert Behavior During Therapy: Flat affect Overall Cognitive Status: Within Functional Limits for tasks assessed                      Exercises      General Comments        Pertinent Vitals/Pain Pain Assessment: No/denies pain    Home Living                      Prior Function            PT Goals (current goals can now be found in the care plan section) Progress towards PT goals: Progressing toward goals    Frequency  Min 3X/week    PT Plan Current plan remains appropriate    Co-evaluation             End of Session Equipment Utilized During Treatment: Gait belt;Oxygen Activity Tolerance: Patient limited by fatigue Patient left: in bed;with call bell/phone within reach;with family/visitor present     Time: [id number] PT Time Calculation (min) (ACUTE ONLY): 9 min  Charges:  $Gait Training: 8-22 mins                    G Codes:      Vena AustriaDavis, Velmer Broadfoot H 02/04/2014, 2:43 PM Durenda HurtSusan H. Renaldo Fiddleravis, PT, [facility] Acute Rehab Services Pager [phone]

## 2014-02-05 LAB — RENAL FUNCTION PANEL
ANION GAP: 9 (ref 5–15)
Albumin: 2.6 g/dL — ABNORMAL LOW (ref 3.5–5.2)
BUN: 39 mg/dL — ABNORMAL HIGH (ref 6–23)
CO2: 24 mmol/L (ref 19–32)
Calcium: 9.4 mg/dL (ref 8.4–10.5)
Chloride: 104 mEq/L (ref 96–112)
Creatinine, Ser: 5.52 mg/dL — ABNORMAL HIGH (ref 0.50–1.10)
GFR, EST AFRICAN AMERICAN: 8 mL/min — AB (ref >90)
GFR, EST NON AFRICAN AMERICAN: 7 mL/min — AB (ref >90)
GLUCOSE: 124 mg/dL — AB (ref 70–99)
Phosphorus: 5.8 mg/dL — ABNORMAL HIGH (ref 2.3–4.6)
Potassium: 3.3 mmol/L — ABNORMAL LOW (ref 3.5–5.1)
SODIUM: 137 mmol/L (ref 135–145)

## 2014-02-05 LAB — CBC
HCT: 28.8 % — ABNORMAL LOW (ref 36.0–46.0)
HEMOGLOBIN: 8.7 g/dL — AB (ref 12.0–15.0)
MCH: 28.2 pg (ref 26.0–34.0)
MCHC: 30.2 g/dL (ref 30.0–36.0)
MCV: 93.2 fL (ref 78.0–100.0)
Platelets: 202 10*3/uL (ref 150–400)
RBC: 3.09 MIL/uL — AB (ref 3.87–5.11)
RDW: 15.3 % (ref 11.5–15.5)
WBC: 9.3 10*3/uL (ref 4.0–10.5)

## 2014-02-05 LAB — GLUCOSE, CAPILLARY: Glucose-Capillary: 118 mg/dL — ABNORMAL HIGH (ref 70–99)

## 2014-02-05 MED ORDER — SODIUM CHLORIDE 0.9 % IV SOLN: 100.0000 mL | INTRAVENOUS | Status: DC | PRN

## 2014-02-05 MED ORDER — LIDOCAINE HCL (PF) 1 % IJ SOLN: 5.0000 mL | INTRAMUSCULAR | Status: DC | PRN

## 2014-02-05 MED ORDER — DARBEPOETIN ALFA 150 MCG/0.3ML IJ SOSY
PREFILLED_SYRINGE | INTRAMUSCULAR | Status: AC
  Administered 2014-02-05: 150 ug via INTRAVENOUS
  Filled 2014-02-05: qty 0.3

## 2014-02-05 MED ORDER — ALTEPLASE 2 MG IJ SOLR
2.0000 mg | Freq: Once | INTRAMUSCULAR | Status: AC | PRN
  Filled 2014-02-05: qty 2

## 2014-02-05 MED ORDER — PENTAFLUOROPROP-TETRAFLUOROETH EX AERO: 1.0000 "application " | INHALATION_SPRAY | CUTANEOUS | Status: DC | PRN

## 2014-02-05 MED ORDER — HEPARIN SODIUM (PORCINE) 1000 UNIT/ML DIALYSIS
1000.0000 [IU] | INTRAMUSCULAR | Status: DC | PRN
  Filled 2014-02-05: qty 1

## 2014-02-05 MED ORDER — NEPRO/CARBSTEADY PO LIQD: 237.0000 mL | ORAL | Status: DC | PRN

## 2014-02-05 MED ORDER — PREDNISONE 10 MG PO TABS
60.0000 mg | ORAL_TABLET | Freq: Every day | ORAL | Status: DC
  Administered 2014-02-06 – 2014-02-13 (×8): 60 mg via ORAL
  Filled 2014-02-05 (×9): qty 1

## 2014-02-05 MED ORDER — LIDOCAINE-PRILOCAINE 2.5-2.5 % EX CREA: 1.0000 "application " | TOPICAL_CREAM | CUTANEOUS | Status: DC | PRN

## 2014-02-05 MED ORDER — HEPARIN SODIUM (PORCINE) 1000 UNIT/ML DIALYSIS
20.0000 [IU]/kg | INTRAMUSCULAR | Status: DC | PRN
  Administered 2014-02-05: 1700 [IU] via INTRAVENOUS_CENTRAL
  Filled 2014-02-05 (×2): qty 2

## 2014-02-05 NOTE — Progress Notes (Signed)
PT Cancellation Note  Patient Details Name: [name] MRN: [id number] DOB: [birth year]   Cancelled Treatment:    Reason Eval/Treat Not Completed: Patient at procedure or test/unavailable. Will continue to check back as schedule allows.    Conni SlipperKirkman, [name] 02/05/2014, 3:47 PM   Conni SlipperLaura Judah Chevere, PT, DPT Acute Rehabilitation Services Pager: [phone]

## 2014-02-05 NOTE — Progress Notes (Signed)
PATIENT DETAILS Name: [name] Age: 79 y.o. Sex: female Date of Birth: 1935/01/27 Admit Date: 01/29/2014 Admitting Physician Benny Lennert, MD PCP:No primary care provider on file.  Brief Narrative Patient is a 79 year old female who was recently discharged from this hospital on 01/21/14, she presented with ARF of unclear etiology,she briefly required HD, by discharge was improving, HD catheter was removed, and patient was discharged to SNF.Patient  Returned on 12/15 with worsening nausea, vomiting, diffuse abdominal pain. Labs showed further worsening in creatinine. Renal/VVS consulted, hemodialysis catheter placed and underwent hemodialysis. She also underwent a renal bx-results are currently pending. Etiology of renal failure seems unclear at present, at present not sure whether she will regain renal function, suspect she will be end-stage renal disease soon. Nephrology continues to follow.  Subjective: No major complaints  Assessment/Plan: Principal Problem:  Acute renal failure: Etiology not clear, recently admitted to this hospital and underwent hemodialysis 2, by discharge creatinine was improving, hemodialysis catheter was removed and patient subsequently discharged to SNF. Returned on 12/15 with worsening nausea, vomiting, diffuse abdominal pain. Further worsening in creatinine. Renal/VVS consulted,  hemodialysis catheter placed  and underwent hemodialysis X 2 so far. Etiology of renal failure not evident, but patient does have some eosinophilia, concern for AIN/Churg Strauss-underwent renal biopsy 12/18-pending results.Still awaiting return of renal function before declaring patient at ESRD. Suspect poor renal prognosis and will be ESRD soon.  Active Problems:   Nausea vomiting and abdominal pain:Mostly resolved but continues intermittently .Likely secondary to above. Abdominal ultrasound shows gallstones,suspect since abd pain was generalized not likely related to  cholelithiasis.Lipase levels normal on admission.Start scheduled Zofran,  and had BM following initiation of  bowel regimen- she is much better today. However if patient continues to have symtpoms-will need to check gastric emptying scan at some point. Belly exam is benign.    Anemia of Renal Disease: on Aranesp    Hypothyroidism: Continue with levothyroxine    GERD:continue PPI    COPD: Lungs clear, c/w prn bronchodilators.    History of hypertension: Previously on antihypertensives, currently blood pressure controlled-infact soft without the use of any antihypertensives. Suspect will need starting Midodrine.Follow BP.    Failure to thrive syndrome: Recurrent hospitalization, worsening renal failure. Obtain PT/nutrition eval. Follow clinically.    Severe malnutrition in the context of acute illness or injury:c/w supplements  Disposition: Remain inpatient-suspect will need SNF on discharge  Antibiotics: None  Anti-infectives    None      DVT Prophylaxis:  prophylatic heparin  Code Status: Full code  Family Communication Son-Keith at bedside  Procedures:  NONE  CONSULTS:  nephrology and vascular surgery  MEDICATIONS: Scheduled Meds: . aspirin EC  81 mg Oral Daily  . darbepoetin (ARANESP) injection - DIALYSIS  150 mcg Intravenous Q Tue-HD  . feeding supplement (NEPRO CARB STEADY)  237 mL Oral BID BM  . heparin subcutaneous  5,000 Units Subcutaneous 3 times per day  . latanoprost  1 drop Both Eyes QHS  . levothyroxine  25 mcg Oral QAC breakfast  . loratadine  10 mg Oral Daily  . mometasone-formoterol  2 puff Inhalation BID  . ondansetron (ZOFRAN) IV  4 mg Intravenous TID AC  . pantoprazole  40 mg Oral Q1200  . pilocarpine  1 drop Right Eye QID  . polyethylene glycol  17 g Oral Daily  . senna-docusate  1 tablet Oral BID  . sodium chloride  3 mL Intravenous Q12H  Continuous Infusions: . sodium chloride 10 mL/hr at 01/30/14 1200  . sodium chloride 10 mL/hr at  02/01/14 1001  . sodium chloride     PRN Meds:.sodium chloride, acetaminophen **OR** acetaminophen, acetaminophen, albuterol, lactulose, ondansetron **OR** ondansetron (ZOFRAN) IV, oxyCODONE-acetaminophen, sodium chloride    PHYSICAL EXAM: Vital signs in last 24 hours: Filed Vitals:   02/04/14 2002 02/04/14 2104 02/05/14 0446 02/05/14 0950  BP:  110/46 113/61 96/45  Pulse:  124 122 105  Temp:  99.4 F (37.4 C) 98.7 F (37.1 C) 98.6 F (37 C)  TempSrc:  Oral Oral Oral  Resp:  18 18 16   Height:      Weight:      SpO2: 98% 99% 97% 95%    Weight change:  Filed Weights   02/02/14 0656 02/02/14 1001 02/03/14 2100  Weight: 81.8 kg (180 lb 5.4 oz) 83 kg (182 lb 15.7 oz) 83.734 kg (184 lb 9.6 oz)   Body mass index is 28.07 kg/(m^2).   Gen Exam: Awake and alert with clear speech.   Neck: Supple, No JVD.   Chest: B/L Clear.  No rales or rhochi CVS:S1S2 Regular, no murmurs. Abdomen: soft, BS +, non tender, non distended.  Extremities: no edema, lower extremities warm to touch. Neurologic: Non Focal.   Skin: No Rash.   Wounds: N/A.    Intake/Output from previous day:  Intake/Output Summary (Last 24 hours) at 02/05/14 1040 Last data filed at 02/05/14 0951  Gross per 24 hour  Intake    680 ml  Output    550 ml  Net    130 ml     LAB RESULTS: CBC  Recent Labs Lab 01/30/14 0553  02/01/14 0438 02/01/14 1710 02/02/14 0440 02/03/14 0446 02/05/14 0531  WBC 8.6  < > 9.7 11.6* 11.4* 9.1 9.3  HGB 9.8*  < > 10.0* 9.5* 9.0* 8.5* 8.7*  HCT 31.5*  < > 30.8* 30.6* 29.1* 28.2* 28.8*  PLT 270  < > 230 186 177 180 202  MCV 91.3  < > 93.3 91.9 92.4 93.1 93.2  MCH 28.4  < > 30.3 28.5 28.6 28.1 28.2  MCHC 31.1  < > 32.5 31.0 30.9 30.1 30.2  RDW 15.9*  < > 15.7* 15.6* 15.6* 15.7* 15.3  LYMPHSABS 1.2  --   --   --   --  1.7  --   MONOABS 1.1*  --   --   --   --  1.2*  --   EOSABS 0.8*  --   --   --   --  0.8*  --   BASOSABS 0.1  --   --   --   --  0.2*  --   < > = values in this  interval not displayed.  Chemistries   Recent Labs Lab 01/30/14 1753  02/01/14 0500 02/02/14 0440 02/03/14 0446 02/04/14 0450 02/05/14 0531  NA 140  < > 141 136* 139 139 137  K 3.3*  < > 3.7 3.9 3.8 3.5* 3.3*  CL 103  < > 101 98 100 99 104  CO2 21  < > 23 21 26 26 24   GLUCOSE 141*  < > 95 101* 90 98 124*  BUN 51*  < > 39* 51* 28* 35* 39*  CREATININE 6.57*  < > 5.85* 7.12* 4.26* 4.78* 5.52*  CALCIUM 8.7  < > 9.2 9.6 8.8 9.6 9.4  MG 2.2  --   --   --   --   --   --   < > =  values in this interval not displayed.  CBG:  Recent Labs Lab 02/05/14 0725  GLUCAP 118*    GFR Estimated Creatinine Clearance: 9.7 mL/min (by C-G formula based on Cr of 5.52).  Coagulation profile  Recent Labs Lab 01/30/14 0921  INR 1.13    Cardiac Enzymes No results for input(s): CKMB, TROPONINI, MYOGLOBIN in the last 168 hours.  Invalid input(s): CK  Invalid input(s): POCBNP No results for input(s): DDIMER in the last 72 hours. No results for input(s): HGBA1C in the last 72 hours. No results for input(s): CHOL, HDL, LDLCALC, TRIG, CHOLHDL, LDLDIRECT in the last 72 hours. No results for input(s): TSH, T4TOTAL, T3FREE, THYROIDAB in the last 72 hours.  Invalid input(s): FREET3  Recent Labs  02/03/14 0446  TIBC 178*  IRON 31*   No results for input(s): LIPASE, AMYLASE in the last 72 hours.  Urine Studies No results for input(s): UHGB, CRYS in the last 72 hours.  Invalid input(s): UACOL, UAPR, USPG, UPH, UTP, UGL, UKET, UBIL, UNIT, UROB, ULEU, UEPI, UWBC, URBC, UBAC, CAST, UCOM, BILUA  MICROBIOLOGY: No results found for this or any previous visit (from the past 240 hour(s)).  RADIOLOGY STUDIES/RESULTS: [place] Abdomen Complete  01/29/2014   CLINICAL DATA:  Abdominal pain, medical renal disease, hypertension  EXAM: ULTRASOUND ABDOMEN COMPLETE  COMPARISON:  Renal ultrasound of January 08, 2014  FINDINGS: Gallbladder: The gallbladder is adequately distended and contains multiple  echogenic mobile shadowing stones. There is no gallbladder wall thickening, pericholecystic fluid, or positive sonographic Murphy's sign.  Common bile duct: Diameter: 6.5 mm  Liver: No focal lesion identified. Within normal limits in parenchymal echogenicity.  IVC: No abnormality visualized.  Pancreas: Largely obscured by bowel gas.  Spleen: Size and appearance within normal limits.  Right Kidney: Length: 13.8 cm. Echogenicity of the cortex is mildly increased. No mass or hydronephrosis visualized. There is a midpole cyst measuring 1.2 cm in diameter.  Left Kidney: Length: 12.5 cm. Echogenicity of the cortex is mildly increased. No mass or hydronephrosis visualized.  Abdominal aorta: No aneurysm visualized.  Other findings: None.  IMPRESSION: 1. Gallstones without evidence of acute cholecystitis. 2. Increased renal cortical echotexture consistent with medical renal disease. This is similar to that seen previously.   Electronically Signed   By: David  Swazil[place]Jordan   On: 01/29/2014 13:26   [place] Renal  01/08/2014   CLINICAL DATA:  Acute renal failure  EXAM: RENAL/URINARY TRACT ULTRASOUND COMPLETE  COMPARISON:  None.  FINDINGS: Right Kidney:  Length: 15 cm. Increased renal cortical echogenicity. No mass or hydronephrosis visualized.  Left Kidney:  Length: 12.3 cm. Increased renal cortical echogenicity. No mass or hydronephrosis visualized.  Bladder:  Appears normal for degree of bladder distention.  IMPRESSION: 1. No obstructive uropathy. 2. Increased renal cortical echogenicity as can be seen with medical renal disease.   Electronically Signed   By: Elige KoHetal  Patel   On: 01/08/2014 21:15   [place] Biopsy-no Radiologist  02/04/2014   : No report.  Please see physicians notes   Electronically Signed   By: Leslye Peerad  Services   On: 02/04/2014 11:47   Dg Chest Port 1 View  01/30/2014   CLINICAL DATA:  Dialysis catheter insertion.  EXAM: PORTABLE CHEST - 1 VIEW  COMPARISON:  01/29/2014.  FINDINGS: Trachea is midline. Heart size  stable. Right IJ dialysis catheter tips project over the SVC. Biapical pleural thickening. Question developing airspace disease in the left lower lobe. Lungs are otherwise clear. No pneumothorax. No definite pleural fluid.  IMPRESSION:  1. Right IJ dialysis catheter insertion without pneumothorax. 2. Question developing left lower lobe airspace disease.   Electronically Signed   By: Leanna Battles M.D.   On: 01/30/2014 13:48   Dg Chest Port 1 View  01/29/2014   CLINICAL DATA:  New onset of nausea and shortness of Breath  EXAM: PORTABLE CHEST - 1 VIEW  COMPARISON:  01/09/2014  FINDINGS: Cardiomegaly again noted. Atherosclerotic calcifications of thoracic aorta. No acute infiltrate or pulmonary edema. Mild basilar atelectasis.  IMPRESSION: No active disease.  Mild basilar atelectasis.   Electronically Signed   By: Natasha Mead M.D.   On: 01/29/2014 08:38   Dg Chest Port 1 View  01/09/2014   CLINICAL DATA:  Hypoxia  EXAM: PORTABLE CHEST - 1 VIEW  COMPARISON:  01/08/2014  FINDINGS: Cardiomediastinal silhouette is stable. Mild elevation of the left hemidiaphragm. There is central mild vascular congestion and mild interstitial prominence suspicious for mild interstitial edema. Small left pleural effusion with left basilar atelectasis or infiltrate. Right IJ sheath is unchanged in position.  IMPRESSION: Mild vascular congestion mild interstitial prominence suspicious for mild interstitial edema. Small left pleural effusion with left basilar atelectasis or infiltrate.   Electronically Signed   By: Natasha Mead M.D.   On: 01/09/2014 10:45   Dg Chest Port 1 View  01/08/2014   CLINICAL DATA:  Evaluate central line placement.  EXAM: PORTABLE CHEST - 1 VIEW  COMPARISON:  01/08/2014  FINDINGS: There is a right IJ catheter with tip in the projection of the SVC. No pneumothorax identified. Mild cardiac enlargement. There is calcified plaque identified within the aortic arch. Pulmonary vascular congestion is noted. There is  bibasilar atelectasis.  IMPRESSION: 1. Tip of the IJ catheter is in the SVC.  No pneumothorax. 2. Cardiac enlargement, atherosclerotic disease and pulmonary vascular congestion.   Electronically Signed   By: Signa Kell M.D.   On: 01/08/2014 23:52   Dg Abd Portable 1v  01/13/2014   CLINICAL DATA:  Generalized abdominal pain, nausea.  EXAM: PORTABLE ABDOMEN - 1 VIEW  COMPARISON:  January 08, 2014.  FINDINGS: The bowel gas pattern is normal. No radio-opaque calculi or other significant radiographic abnormality are seen.  IMPRESSION: No evidence of bowel obstruction or ileus.   Electronically Signed   By: Roque Lias M.D.   On: 01/13/2014 18:18   Dg Fluoro Guide Cv Line-no Report  01/30/2014   CLINICAL DATA:    FLOURO GUIDE CV LINE  Fluoroscopy was utilized by the requesting physician.  No radiographic  interpretation.     Jeoffrey Massed, MD  [facility] Pager:[phone]  If 7PM-7AM, please contact night-coverage [email or web address] Password [email or web address] 02/05/2014, 10:40 AM   LOS: 7 days

## 2014-02-05 NOTE — Progress Notes (Signed)
Assessment/Plan: 1 AKI, nonoliguric Renal bx prelim pending. I am not optimistic about renal progniosis.  Will do dialysis today. Vein Mapping done in November.  I will ask VVS to eval for AV access. 2 Obesity 3 COPD per primary  Subjective: Interval History: Not feeling energetic, occ nausea  Objective: Vital signs in last 24 hours: Temp:  [97.9 F (36.6 C)-99.4 F (37.4 C)] 98.7 F (37.1 C) (12/22 0446) Pulse Rate:  [113-124] 122 (12/22 0446) Resp:  [18] 18 (12/22 0446) BP: (107-113)/(46-62) 113/61 mmHg (12/22 0446) SpO2:  [97 %-99 %] 97 % (12/22 0446) Weight change:   Intake/Output from previous day: 12/21 0701 - 12/22 0700 In: 680 [P.O.:680] Out: 550 [Urine:550] Intake/Output this shift:    General appearance: alert, cooperative and pale Chest wall: no tenderness Cardio: regular rate and rhythm, S1, S2 normal, no murmur, click, rub or gallop Extremities: edema tr  Lab Results:  Recent Labs  02/03/14 0446 02/05/14 0531  WBC 9.1 9.3  HGB 8.5* 8.7*  HCT 28.2* 28.8*  PLT 180 202   BMET:  Recent Labs  02/04/14 0450 02/05/14 0531  NA 139 137  K 3.5* 3.3*  CL 99 104  CO2 26 24  GLUCOSE 98 124*  BUN 35* 39*  CREATININE 4.78* 5.52*  CALCIUM 9.6 9.4   No results for input(s): PTH in the last 72 hours. Iron Studies:  Recent Labs  02/03/14 0446  IRON 31*  TIBC 178*   Studies/Results: No results found.  Scheduled: . aspirin EC  81 mg Oral Daily  . darbepoetin (ARANESP) injection - DIALYSIS  150 mcg Intravenous Q Tue-HD  . feeding supplement (NEPRO CARB STEADY)  237 mL Oral BID BM  . heparin subcutaneous  5,000 Units Subcutaneous 3 times per day  . latanoprost  1 drop Both Eyes QHS  . levothyroxine  25 mcg Oral QAC breakfast  . loratadine  10 mg Oral Daily  . mometasone-formoterol  2 puff Inhalation BID  . ondansetron (ZOFRAN) IV  4 mg Intravenous TID AC  . pantoprazole  40 mg Oral Q1200  . pilocarpine  1 drop Right Eye QID  . polyethylene  glycol  17 g Oral Daily  . senna-docusate  1 tablet Oral BID  . sodium chloride  3 mL Intravenous Q12H      LOS: 7 days   Lakyla Biswas C 02/05/2014,9:43 AM

## 2014-02-05 NOTE — Progress Notes (Signed)
NUTRITION FOLLOW UP  Pt meets criteria for SEVERE MALNUTRITION in the context of acute illness/injury as evidenced by a 12% weight loss in 2 weeks and energy intake of </= 50% for >/= 5 days.  DOCUMENTATION CODES Per approved criteria  -Severe malnutrition in the context of acute illness or injury   INTERVENTION: Continue providing butter pecan Nepro Shake po BID, each supplement provides 425 kcal and 19 grams protein.  Encourage adequate PO intake.  NUTRITION DIAGNOSIS: Increased nutrient needs related to AKI as evidenced by estimated nutrition needs; ongoing  Goal: Pt to meet >/= 90% of their estimated nutrition needs; met  Monitor:  PO intake, weight trends, labs, I/O's  79 y.o. female  Admitting Dx: AKI (acute kidney injury)  ASSESSMENT: Pt presenting as a transfer from her nursing home, At her facility she has had ongoing generalized weakness, fatigue, poor tolerance to physical exertion. Her son who is present at bedside reports that she "picks at her food." She also complains of intermittent abdominal pain, had episode of nausea and vomiting.  Procedure: (12/16): #1 bilateral ultrasound localization internal jugular veins #2 insertion of tunneled hemodialysis catheter via right IJ-diateck -19 cm  Current plans for permanent dialysis access. Meal completion has been 85-100%. Son reports pt currently has a good appetite. Pt was been drinking her Nepro Shakes. Will continue with current intervention. Pt was encouraged to eat her food at meals and to drink her supplements.   Labs: Low potassium and GFR. High BUN, creatinine, and phosphorous (5.8).  Height: Ht Readings from Last 1 Encounters:  01/29/14 _0  (1.727 m)    Weight: Wt Readings from Last 1 Encounters:  02/05/14 183 lb 10.3 oz (83.3 kg)  12/01  210 lbs  BMI:  Body mass index is 27.93 kg/(m^2).  Re-Estimated Nutritional Needs: Kcal: 1850-2100 Protein: 95-110 grams Fluid: 1.2 L/day  Skin: +1 LUE  edema, non-pitting LE edema, incision on right neck and lower back  Diet Order: Diet renal W/12107m fluid restriction   Intake/Output Summary (Last 24 hours) at 02/05/14 1459 Last data filed at 02/05/14 0951  Gross per 24 hour  Intake    680 ml  Output    150 ml  Net    530 ml    Last BM: 12/21  Labs:   Recent Labs Lab 01/30/14 1753  02/03/14 0446 02/04/14 0450 02/05/14 0531  NA 140  < > 139 139 137  K 3.3*  < > 3.8 3.5* 3.3*  CL 103  < > 100 99 104  CO2 21  < > _1 BUN 51*  < > 28* 35* 39*  CREATININE 6.57*  < > 4.26* 4.78* 5.52*  CALCIUM 8.7  < > 8.8 9.6 9.4  MG 2.2  --   --   --   --   PHOS  --   < > 4.3 4.4 5.8*  GLUCOSE 141*  < > 90 98 124*  < > = values in this interval not displayed.  CBG (last 3)   Recent Labs  02/05/14 0725  GLUCAP 118*    Scheduled Meds: . aspirin EC  81 mg Oral Daily  . darbepoetin (ARANESP) injection - DIALYSIS  150 mcg Intravenous Q Tue-HD  . feeding supplement (NEPRO CARB STEADY)  237 mL Oral BID BM  . heparin subcutaneous  5,000 Units Subcutaneous 3 times per day  . latanoprost  1 drop Both Eyes QHS  . levothyroxine  25 mcg Oral QAC breakfast  . loratadine  10 mg Oral Daily  . mometasone-formoterol  2 puff Inhalation BID  . ondansetron (ZOFRAN) IV  4 mg Intravenous TID AC  . pantoprazole  40 mg Oral Q1200  . pilocarpine  1 drop Right Eye QID  . polyethylene glycol  17 g Oral Daily  . [START ON 02/06/2014] predniSONE  60 mg Oral Q breakfast  . senna-docusate  1 tablet Oral BID  . sodium chloride  3 mL Intravenous Q12H    Continuous Infusions: . sodium chloride 10 mL/hr at 01/30/14 1200  . sodium chloride 10 mL/hr at 02/01/14 1001  . sodium chloride      Past Medical History  Diagnosis Date  . Hypertension   . Hypothyroidism   . Seasonal allergies   . Glaucoma   . COPD (chronic obstructive pulmonary disease)   . Former smoker   . Renal disorder     Past Surgical History  Procedure Laterality Date  .  Insertion of dialysis catheter Right 01/30/2014    Procedure: INSERTION OF DIALYSIS CATHETER RIGHT INTERNAL JUGULAR;  Surgeon: Mal Misty, MD;  Location: [facility];  Service: Vascular;  Laterality: Right;    Kallie Locks, MS, RD, LDN Pager # [phone] After hours/ weekend pager # [phone]

## 2014-02-05 NOTE — Progress Notes (Signed)
Renal biopsy prelim per phone shows diffuse acute interstitial nephritis, some eosinophils, predominance of lymphocytes, severe arteriosclerosis.  Based upon this, I will start steroids. Will ask VVS to hold a few days on AV access placement.

## 2014-02-05 NOTE — Consult Note (Signed)
[facility] Consult  Reason for Consult:  Permanent dialysis access Referring Physician: Dr. Lowell GuitarPowell MRN #:  [id number]  History of Present Illness: This is a 79 y.o. female who we've been consulted on regarding permanent dialysis access. She recently had a right IJ tunneled dialysis catheter placed by Dr. Hart RochesterLawson on 01/30/14. The patient is right handed. She had SVT of her left arm back during her previous admission this past November. Her current renal biopsy results are pending. The etiology of her renal failure is unclear at this time.  Nephrology is not optimistic with regaining renal function.   She has a PMH of hypertension not currently on medications. She has COPD and hypothyroidism.   On ROS, she denies any pain, weakness or numbness in her extremities. She denies any chest pain or shortness of breath. All other systems negative.  Past Medical History  Diagnosis Date  . Hypertension   . Hypothyroidism   . Seasonal allergies   . Glaucoma   . COPD (chronic obstructive pulmonary disease)   . Former smoker   . Renal disorder    Past Surgical History  Procedure Laterality Date  . Insertion of dialysis catheter Right 01/30/2014    Procedure: INSERTION OF DIALYSIS CATHETER RIGHT INTERNAL JUGULAR;  Surgeon: Pryor OchoaJames D Lawson, MD;  [facility];  Service: Vascular;  Laterality: Right;    Allergies  Allergen Reactions  . Ciprofloxacin Nausea And Vomiting    Prior to Admission medications   Medication Sig Start Date End Date Taking? Authorizing Provider  albuterol (PROVENTIL) (2.5 MG/3ML) 0.083% nebulizer solution Take 2.5 mg by nebulization every 4 (four) hours as needed for wheezing or shortness of breath.   Yes Historical Provider, MD  ascorbic acid (VITAMIN C) 1000 MG tablet Take 1,000 mg by mouth 3 (three) times daily.   Yes Historical Provider, MD  aspirin EC 81 MG tablet Take 81 mg by mouth daily.   Yes Historical Provider, MD  azelastine  (ASTELIN) 0.1 % nasal spray Place 1 spray into both nostrils daily. Use in each nostril as directed   Yes Historical Provider, MD  Brinzolamide-Brimonidine 1-0.2 % SUSP Place 1 drop into both eyes 2 (two) times daily.   Yes Historical Provider, MD  cholecalciferol (VITAMIN D) 1000 UNITS tablet Take 1,000 Units by mouth daily.   Yes Historical Provider, MD  Fluticasone-Salmeterol (ADVAIR DISKUS) 250-50 MCG/DOSE AEPB Inhale 1 puff into the lungs 2 (two) times daily.   Yes Historical Provider, MD  folic acid (FOLVITE) 1 MG tablet Take 1 tablet (1 mg total) by mouth daily. 01/21/14  Yes Maryann Mikhail, DO  Garlic 1000 MG CAPS Take 1 capsule by mouth daily.   Yes Historical Provider, MD  latanoprost (XALATAN) 0.005 % ophthalmic solution Place 1 drop into both eyes at bedtime.   Yes Historical Provider, MD  levothyroxine (SYNTHROID, LEVOTHROID) 25 MCG tablet Take 25 mcg by mouth daily before breakfast.   Yes Historical Provider, MD  loratadine (CLARITIN) 10 MG tablet Take 10 mg by mouth daily.   Yes Historical Provider, MD  LUTEIN-ZEAXANTHIN PO Take 1 tablet by mouth daily.   Yes Historical Provider, MD  Multiple Vitamins-Minerals (MULTIVITAMIN WITH MINERALS) tablet Take 1 tablet by mouth daily.   Yes Historical Provider, MD  Omega-3 Fatty Acids (FISH OIL) 1000 MG CAPS Take 1 capsule by mouth daily.   Yes Historical Provider, MD  omeprazole (PRILOSEC) 40 MG capsule Take 40 mg by mouth daily.   Yes Historical Provider, MD  pilocarpine (PILOCAR) 1 % ophthalmic solution Place 1 drop into the right eye 4 (four) times daily.   Yes Historical Provider, MD  PROAIR HFA 108 (90 BASE) MCG/ACT inhaler Inhale 2 puffs into the lungs every 6 (six) hours as needed for wheezing or shortness of breath.  11/20/13  Yes Historical Provider, MD  senna-docusate (SENOKOT-S) 8.6-50 MG per tablet Take 1 tablet by mouth 2 (two) times daily. 01/21/14  Yes Maryann Mikhail, DO  thiamine 100 MG tablet Take 1 tablet (100 mg total) by  mouth daily. 01/21/14  Yes Edsel Petrin, DO    History   Social History  . Marital Status: Widowed    Spouse Name: N/A    Number of Children: N/A  . Years of Education: N/A   Occupational History  . Not on file.   Social History Main Topics  . Smoking status: Former Games developer  . Smokeless tobacco: Not on file  . Alcohol Use: Not on file  . Drug Use: Not on file  . Sexual Activity: Not Currently   Other Topics Concern  . Not on file   Social History Narrative   Worked in [place]   History reviewed. No pertinent family history.  Physical Examination  Filed Vitals:   02/05/14 0950  BP: 96/45  Pulse: 105  Temp: 98.6 F (37 C)  Resp: 16   Body mass index is 28.07 kg/(m^2).  General:  WDWN elderly female in NAD Gait: Not observed HENT: WNL, normocephalic Pulmonary: normal non-labored breathing, without Rales, rhonchi,  wheezing Cardiac: regular, without  Murmurs, rubs or gallops; without carotid bruits Abdomen: soft, NT/ND, no masses Skin: without rashes, without ulcers  Vascular Exam/Pulses: 2+ radial pulses and brachial pulses bilaterally Extremities: without ischemic changes, without Gangrene , without cellulitis; without open wounds;  Musculoskeletal: no muscle wasting or atrophy  Neurologic: A&O X 3; Appropriate Affect ; SENSATION: normal; MOTOR FUNCTION:  moving all extremities equally. Speech is fluent/normal  CBC    Component Value Date/Time   WBC 9.3 02/05/2014 0531   RBC 3.09* 02/05/2014 0531   HGB 8.7* 02/05/2014 0531   HCT 28.8* 02/05/2014 0531   PLT 202 02/05/2014 0531   MCV 93.2 02/05/2014 0531   MCH 28.2 02/05/2014 0531   MCHC 30.2 02/05/2014 0531   RDW 15.3 02/05/2014 0531   LYMPHSABS 1.7 02/03/2014 0446   MONOABS 1.2* 02/03/2014 0446   EOSABS 0.8* 02/03/2014 0446   BASOSABS 0.2* 02/03/2014 0446    BMET    Component Value Date/Time   NA 137 02/05/2014 0531   K 3.3* 02/05/2014 0531   CL 104 02/05/2014 0531   CO2 24 02/05/2014 0531    GLUCOSE 124* 02/05/2014 0531   BUN 39* 02/05/2014 0531   CREATININE 5.52* 02/05/2014 0531   CALCIUM 9.4 02/05/2014 0531   GFRNONAA 7* 02/05/2014 0531   GFRAA 8* 02/05/2014 0531    COAGS: Lab Results  Component Value Date   INR 1.13 01/30/2014     ASSESSMENT/PLAN: This is a 79 y.o. female with AKI currently on HD via right IJ tunneled dialysis catheter. We have been asked to evaluate for permanent dialysis access. She had a left upper extremity venous duplex on 01/12/14 that revealed SVT in her left cephalic and basilic veins without evidence of DVT. Her renal biopsy is currently pending. Nephrology is following and is not optimistic regarding return of renal function.   Ordered bilateral upper extremity vein mapping to evaluate for fistula options. The patient is right handed. Dr. Myra Gianotti to  see patient.     Maris BergerKimberly Ashtyn Freilich, PA-C [facility] Office: [phone] Pager: [phone]

## 2014-02-06 DIAGNOSIS — Z01810 Encounter for preprocedural cardiovascular examination: Secondary | ICD-10-CM

## 2014-02-06 LAB — CBC
HCT: 26.9 % — ABNORMAL LOW (ref 36.0–46.0)
HEMOGLOBIN: 8.1 g/dL — AB (ref 12.0–15.0)
MCH: 28.7 pg (ref 26.0–34.0)
MCHC: 30.1 g/dL (ref 30.0–36.0)
MCV: 95.4 fL (ref 78.0–100.0)
PLATELETS: 138 10*3/uL — AB (ref 150–400)
RBC: 2.82 MIL/uL — AB (ref 3.87–5.11)
RDW: 15.3 % (ref 11.5–15.5)
WBC: 8.4 10*3/uL (ref 4.0–10.5)

## 2014-02-06 LAB — RENAL FUNCTION PANEL
Albumin: 2.6 g/dL — ABNORMAL LOW (ref 3.5–5.2)
Anion gap: 6 (ref 5–15)
BUN: 11 mg/dL (ref 6–23)
CO2: 31 mmol/L (ref 19–32)
CREATININE: 3.1 mg/dL — AB (ref 0.50–1.10)
Calcium: 8.3 mg/dL — ABNORMAL LOW (ref 8.4–10.5)
Chloride: 101 mEq/L (ref 96–112)
GFR calc Af Amer: 16 mL/min — ABNORMAL LOW (ref >90)
GFR calc non Af Amer: 13 mL/min — ABNORMAL LOW (ref >90)
GLUCOSE: 98 mg/dL (ref 70–99)
PHOSPHORUS: 2.6 mg/dL (ref 2.3–4.6)
Potassium: 3.8 mmol/L (ref 3.5–5.1)
Sodium: 138 mmol/L (ref 135–145)

## 2014-02-06 MED ORDER — SODIUM CHLORIDE 0.9 % IV BOLUS (SEPSIS)
250.0000 mL | Freq: Once | INTRAVENOUS | Status: AC
  Administered 2014-02-06: 250 mL via INTRAVENOUS

## 2014-02-06 NOTE — Progress Notes (Signed)
HR increased to 140's with activity. BP running soft.  Patient takes Atenolol at home.  MD notified.  Gave 250 cc NS bolus per MD order.  BP 92/44 HR 108.  MD notified.  Will continue to monitor.

## 2014-02-06 NOTE — Progress Notes (Signed)
Repeat BP 96/42 HR 92. MD notified.  Will continue to monitor.

## 2014-02-06 NOTE — Progress Notes (Signed)
PATIENT DETAILS Name: [name] Age: 79 y.o. Sex: female Date of Birth: [birth year] Admit Date: 01/29/2014 Admitting Physician Benny Lennert, MD PCP:No primary care provider on file.  Brief Narrative From prior PN Patient is a 79 year old female who was recently discharged from this hospital on 01/21/14, she presented with ARF of unclear etiology,she briefly required HD, by discharge was improving, HD catheter was removed, and patient was discharged to SNF.Patient  Returned on 12/15 with worsening nausea, vomiting, diffuse abdominal pain. Labs showed further worsening in creatinine. Renal/VVS consulted, hemodialysis catheter placed and underwent hemodialysis. She also underwent a renal bx-results are currently pending. Etiology of renal failure seems unclear at present, at present not sure whether she will regain renal function, suspect she will be end-stage renal disease soon. Nephrology continues to follow.  Subjective: No major complaints  Assessment/Plan: Principal Problem:  Acute renal failure: Etiology not clear patient had renal biopsy and prelim shows diffuse acute interstitial nephritis, eosinophils, predominance of lymphocytes, severe arteriosclerosis.  - Nephrology on board and making further recommendations  Active Problems:   Nausea vomiting and abdominal pain: - No new complaints reported. - Will continue antiemetics PRN    Anemia of Renal Disease: on Aranesp    Hypothyroidism: Continue with levothyroxine    GERD:continue PPI    COPD: Lungs clear, c/w prn bronchodilators.  Hypotension - New problem, I will administer 250 cc fluid bolus. - After bolus BP's improved    Failure to thrive syndrome:  - PT evaluation     Severe malnutrition in the context of acute illness or injury: - on Nepro feeding supplement  Disposition: Remain inpatient-suspect will need SNF on discharge  Antibiotics: None  Anti-infectives    None      DVT  Prophylaxis:  prophylatic heparin  Code Status: Full code  Family Communication Son-Keith at bedside  Procedures:  NONE  CONSULTS:  nephrology and vascular surgery  MEDICATIONS: Scheduled Meds: . aspirin EC  81 mg Oral Daily  . darbepoetin (ARANESP) injection - DIALYSIS  150 mcg Intravenous Q Tue-HD  . feeding supplement (NEPRO CARB STEADY)  237 mL Oral BID BM  . heparin subcutaneous  5,000 Units Subcutaneous 3 times per day  . latanoprost  1 drop Both Eyes QHS  . levothyroxine  25 mcg Oral QAC breakfast  . loratadine  10 mg Oral Daily  . mometasone-formoterol  2 puff Inhalation BID  . ondansetron (ZOFRAN) IV  4 mg Intravenous TID AC  . pantoprazole  40 mg Oral Q1200  . pilocarpine  1 drop Right Eye QID  . polyethylene glycol  17 g Oral Daily  . predniSONE  60 mg Oral Q breakfast  . senna-docusate  1 tablet Oral BID  . sodium chloride  3 mL Intravenous Q12H   Continuous Infusions: . sodium chloride 10 mL/hr at 01/30/14 1200  . sodium chloride 10 mL/hr at 02/01/14 1001  . sodium chloride     PRN Meds:.sodium chloride, sodium chloride, sodium chloride, acetaminophen **OR** acetaminophen, acetaminophen, albuterol, feeding supplement (NEPRO CARB STEADY), heparin, heparin, lactulose, lidocaine (PF), lidocaine-prilocaine, ondansetron **OR** ondansetron (ZOFRAN) IV, oxyCODONE-acetaminophen, pentafluoroprop-tetrafluoroeth, sodium chloride    PHYSICAL EXAM: Vital signs in last 24 hours: Filed Vitals:   02/06/14 0922 02/06/14 0935 02/06/14 1114 02/06/14 1130  BP: 93/50 92/44 96/42  138/50  Pulse: 118 108 92 79  Temp: 98.6 F (37 C)   99.1 F (37.3 C)  TempSrc: Oral   Oral  Resp: 18  20  Height:      Weight:      SpO2: 98%   98%    Weight change:  Filed Weights   02/03/14 2100 02/05/14 1203 02/05/14 1545  Weight: 83.734 kg (184 lb 9.6 oz) 83.3 kg (183 lb 10.3 oz) 83.5 kg (184 lb 1.4 oz)   Body mass index is 28 kg/(m^2).   Gen Exam: Awake and alert with clear  speech.   Neck: Supple, No JVD.   Chest: CTA BL, no wheezes CVS:S1S2 Regular, no murmurs. Abdomen: soft, BS +, non tender, non distended.  Extremities: no edema, lower extremities warm to touch. Neurologic: No facial asymmetry, answers questions appropriately Skin: No Rash.   Wounds: N/A.    Intake/Output from previous day:  Intake/Output Summary (Last 24 hours) at 02/06/14 1209 Last data filed at 02/06/14 0900  Gross per 24 hour  Intake    240 ml  Output   -270 ml  Net    510 ml     LAB RESULTS: CBC  Recent Labs Lab 02/01/14 1710 02/02/14 0440 02/03/14 0446 02/05/14 0531 02/06/14 0500  WBC 11.6* 11.4* 9.1 9.3 8.4  HGB 9.5* 9.0* 8.5* 8.7* 8.1*  HCT 30.6* 29.1* 28.2* 28.8* 26.9*  PLT 186 177 180 202 138*  MCV 91.9 92.4 93.1 93.2 95.4  MCH 28.5 28.6 28.1 28.2 28.7  MCHC 31.0 30.9 30.1 30.2 30.1  RDW 15.6* 15.6* 15.7* 15.3 15.3  LYMPHSABS  --   --  1.7  --   --   MONOABS  --   --  1.2*  --   --   EOSABS  --   --  0.8*  --   --   BASOSABS  --   --  0.2*  --   --     Chemistries   Recent Labs Lab 01/30/14 1753  02/02/14 0440 02/03/14 0446 02/04/14 0450 02/05/14 0531 02/06/14 0500  NA 140  < > 136* 139 139 137 138  K 3.3*  < > 3.9 3.8 3.5* 3.3* 3.8  CL 103  < > 98 100 99 104 101  CO2 21  < > 21 26 26 24 31   GLUCOSE 141*  < > 101* 90 98 124* 98  BUN 51*  < > 51* 28* 35* 39* 11  CREATININE 6.57*  < > 7.12* 4.26* 4.78* 5.52* 3.10*  CALCIUM 8.7  < > 9.6 8.8 9.6 9.4 8.3*  MG 2.2  --   --   --   --   --   --   < > = values in this interval not displayed.  CBG:  Recent Labs Lab 02/05/14 0725  GLUCAP 118*    GFR Estimated Creatinine Clearance: 17.2 mL/min (by C-G formula based on Cr of 3.1).  Coagulation profile No results for input(s): INR, PROTIME in the last 168 hours.  Cardiac Enzymes No results for input(s): CKMB, TROPONINI, MYOGLOBIN in the last 168 hours.  Invalid input(s): CK  Invalid input(s): POCBNP No results for input(s): DDIMER in  the last 72 hours. No results for input(s): HGBA1C in the last 72 hours. No results for input(s): CHOL, HDL, LDLCALC, TRIG, CHOLHDL, LDLDIRECT in the last 72 hours. No results for input(s): TSH, T4TOTAL, T3FREE, THYROIDAB in the last 72 hours.  Invalid input(s): FREET3 No results for input(s): VITAMINB12, FOLATE, FERRITIN, TIBC, IRON, RETICCTPCT in the last 72 hours. No results for input(s): LIPASE, AMYLASE in the last 72 hours.  Urine Studies No results for input(s): UHGB, CRYS  in the last 72 hours.  Invalid input(s): UACOL, UAPR, USPG, UPH, UTP, UGL, UKET, UBIL, UNIT, UROB, ULEU, UEPI, UWBC, URBC, UBAC, CAST, UCOM, BILUA  MICROBIOLOGY: No results found for this or any previous visit (from the past 240 hour(s)).  RADIOLOGY STUDIES/RESULTS: [place] Abdomen Complete  01/29/2014   CLINICAL DATA:  Abdominal pain, medical renal disease, hypertension  EXAM: ULTRASOUND ABDOMEN COMPLETE  COMPARISON:  Renal ultrasound of January 08, 2014  FINDINGS: Gallbladder: The gallbladder is adequately distended and contains multiple echogenic mobile shadowing stones. There is no gallbladder wall thickening, pericholecystic fluid, or positive sonographic Murphy's sign.  Common bile duct: Diameter: 6.5 mm  Liver: No focal lesion identified. Within normal limits in parenchymal echogenicity.  IVC: No abnormality visualized.  Pancreas: Largely obscured by bowel gas.  Spleen: Size and appearance within normal limits.  Right Kidney: Length: 13.8 cm. Echogenicity of the cortex is mildly increased. No mass or hydronephrosis visualized. There is a midpole cyst measuring 1.2 cm in diameter.  Left Kidney: Length: 12.5 cm. Echogenicity of the cortex is mildly increased. No mass or hydronephrosis visualized.  Abdominal aorta: No aneurysm visualized.  Other findings: None.  IMPRESSION: 1. Gallstones without evidence of acute cholecystitis. 2. Increased renal cortical echotexture consistent with medical renal disease. This is similar  to that seen previously.   Electronically Signed   By: David  SwazilandJordan   On: 01/29/2014 13:26   [place] Renal  01/08/2014   CLINICAL DATA:  Acute renal failure  EXAM: RENAL/URINARY TRACT ULTRASOUND COMPLETE  COMPARISON:  None.  FINDINGS: Right Kidney:  Length: 15 cm. Increased renal cortical echogenicity. No mass or hydronephrosis visualized.  Left Kidney:  Length: 12.3 cm. Increased renal cortical echogenicity. No mass or hydronephrosis visualized.  Bladder:  Appears normal for degree of bladder distention.  IMPRESSION: 1. No obstructive uropathy. 2. Increased renal cortical echogenicity as can be seen with medical renal disease.   Electronically Signed   By: Elige KoHetal  Patel   On: 01/08/2014 21:15   [place] Biopsy-no Radiologist  02/04/2014   : No report.  Please see physicians notes   Electronically Signed   By: Leslye Peerad  Services   On: 02/04/2014 11:47   Dg Chest Port 1 View  01/30/2014   CLINICAL DATA:  Dialysis catheter insertion.  EXAM: PORTABLE CHEST - 1 VIEW  COMPARISON:  01/29/2014.  FINDINGS: Trachea is midline. Heart size stable. Right IJ dialysis catheter tips project over the SVC. Biapical pleural thickening. Question developing airspace disease in the left lower lobe. Lungs are otherwise clear. No pneumothorax. No definite pleural fluid.  IMPRESSION: 1. Right IJ dialysis catheter insertion without pneumothorax. 2. Question developing left lower lobe airspace disease.   Electronically Signed   By: Leanna BattlesMelinda  Blietz M.D.   On: 01/30/2014 13:48   Dg Chest Port 1 View  01/29/2014   CLINICAL DATA:  New onset of nausea and shortness of Breath  EXAM: PORTABLE CHEST - 1 VIEW  COMPARISON:  01/09/2014  FINDINGS: Cardiomegaly again noted. Atherosclerotic calcifications of thoracic aorta. No acute infiltrate or pulmonary edema. Mild basilar atelectasis.  IMPRESSION: No active disease.  Mild basilar atelectasis.   Electronically Signed   By: Natasha MeadLiviu  Pop M.D.   On: 01/29/2014 08:38   Dg Chest Port 1 View  01/09/2014    CLINICAL DATA:  Hypoxia  EXAM: PORTABLE CHEST - 1 VIEW  COMPARISON:  01/08/2014  FINDINGS: Cardiomediastinal silhouette is stable. Mild elevation of the left hemidiaphragm. There is central mild vascular congestion and mild interstitial prominence  suspicious for mild interstitial edema. Small left pleural effusion with left basilar atelectasis or infiltrate. Right IJ sheath is unchanged in position.  IMPRESSION: Mild vascular congestion mild interstitial prominence suspicious for mild interstitial edema. Small left pleural effusion with left basilar atelectasis or infiltrate.   Electronically Signed   By: Natasha MeadLiviu  Pop M.D.   On: 01/09/2014 10:45   Dg Chest Port 1 View  01/08/2014   CLINICAL DATA:  Evaluate central line placement.  EXAM: PORTABLE CHEST - 1 VIEW  COMPARISON:  01/08/2014  FINDINGS: There is a right IJ catheter with tip in the projection of the SVC. No pneumothorax identified. Mild cardiac enlargement. There is calcified plaque identified within the aortic arch. Pulmonary vascular congestion is noted. There is bibasilar atelectasis.  IMPRESSION: 1. Tip of the IJ catheter is in the SVC.  No pneumothorax. 2. Cardiac enlargement, atherosclerotic disease and pulmonary vascular congestion.   Electronically Signed   By: Signa Kellaylor  Stroud M.D.   On: 01/08/2014 23:52   Dg Abd Portable 1v  01/13/2014   CLINICAL DATA:  Generalized abdominal pain, nausea.  EXAM: PORTABLE ABDOMEN - 1 VIEW  COMPARISON:  January 08, 2014.  FINDINGS: The bowel gas pattern is normal. No radio-opaque calculi or other significant radiographic abnormality are seen.  IMPRESSION: No evidence of bowel obstruction or ileus.   Electronically Signed   By: Roque LiasJames  Green M.D.   On: 01/13/2014 18:18   Dg Fluoro Guide Cv Line-no Report  01/30/2014   CLINICAL DATA:    FLOURO GUIDE CV LINE  Fluoroscopy was utilized by the requesting physician.  No radiographic  interpretation.     Penny PiaVEGA, Eula Jaster, MD  [facility] Pager:[phone]  [phone]  If 7PM-7AM, please contact night-coverage [email or web address] Password [email or web address] 02/06/2014, 12:09 PM   LOS: 8 days

## 2014-02-06 NOTE — Clinical Social Work Note (Signed)
CSW continuing to follow patient's progress. Patient has acute kidney injury and is receiving dialysis. CSW will talk with family as patient nears readiness for discharge to determine if d/c plan will be back to [facility] [place] or [place].   Genelle BalVanessa Altan Kraai, MSW, LCSW Licensed Clinical Social Worker Clinical Social Work Department [facility] [phone]

## 2014-02-06 NOTE — Progress Notes (Signed)
Physical Therapy Treatment Patient Details Name: [name] MRN: [id number] DOB: July 30, 1934 Today's Date: 02/06/2014    History of Present Illness Patient is a 79 yo female admitted 01/29/14 with weakness, AKI, FTT.  PMH:  ESRD on HD, HTN, COPD    PT Comments    Pt unable to progress with mobility today due to fatigue and elevated HR with activity, into 140s, pt with soft pressures today as well per nursing. PT will continue to follow.   Follow Up Recommendations  SNF;Supervision/Assistance - 24 hour     Equipment Recommendations  Rolling walker with 5" wheels    Recommendations for Other Services       Precautions / Restrictions Precautions Precautions: Fall Restrictions Weight Bearing Restrictions: No    Mobility  Bed Mobility Overal bed mobility: Needs Assistance Bed Mobility: Supine to Sit;Sit to Supine     Supine to sit: Supervision Sit to supine: Supervision   General bed mobility comments: with rail, pt able to get self to EOB with slow mvmts  Transfers Overall transfer level: Needs assistance Equipment used: Rolling walker (2 wheeled) Transfers: Sit to/from Stand Sit to Stand: Min assist         General transfer comment: min A for safety as pt not feeling well but able to power self up without assist from therapist  Ambulation/Gait Ambulation/Gait assistance: Min assist Ambulation Distance (Feet): 30 Feet (15' x2) Assistive device: Rolling walker (2 wheeled) Gait Pattern/deviations: Step-through pattern;Trunk flexed Gait velocity: Decreased   General Gait Details: pt's son reports that at home she is able to walk from one end of house to another before resting, today could not tolerate more than 15' at a time before sitting. Kept on 2L O2, O2 sats remained no lower than 94% but HR up to 140s with ambulation and pt symptomatic. RN notified and further repetitions of ambulation not performed.   Stairs            Wheelchair Mobility     Modified Rankin (Stroke Patients Only)       Balance Overall balance assessment: Needs assistance Sitting-balance support: Feet supported Sitting balance-[name]: Good     Standing balance support: Bilateral upper extremity supported Standing balance-[name]: Poor                      Cognition Arousal/Alertness: Awake/alert Behavior During Therapy: Flat affect Overall Cognitive Status: Within Functional Limits for tasks assessed                      Exercises General Exercises - Lower Extremity Ankle Circles/Pumps: AROM;Both;10 reps;Seated Long Arc Quad: AROM;Both;10 reps;Seated Hip ABduction/ADduction: AROM;Both;10 reps;Seated Hip Flexion/Marching: AROM;Both;10 reps;Seated Toe Raises: AROM;Both;10 reps;Seated Heel Raises: AROM;Both;10 reps;Seated    General Comments General comments (skin integrity, edema, etc.): pt's son reports that at home pt takes meds to lower HR, RN notified and came in to discuss this with son as pt has not been receiving those meds in hospital      Pertinent Vitals/Pain Pain Assessment: No/denies pain  See gait    Home Living                      Prior Function            PT Goals (current goals can now be found in the care plan section) Acute Rehab PT Goals Patient Stated Goal: None stated PT Goal Formulation: With patient/family Time For Goal Achievement: 02/09/14  Potential to Achieve Goals: Fair Progress towards PT goals: Not progressing toward goals - comment (due to increased HR)    Frequency  Min 3X/week    PT Plan Current plan remains appropriate    Co-evaluation             End of Session Equipment Utilized During Treatment: Gait belt;Oxygen Activity Tolerance: Patient limited by fatigue Patient left: in bed;with call bell/phone within reach;with family/visitor present     Time: [id number] PT Time Calculation (min) (ACUTE ONLY): 27 min  Charges:  $Gait Training: 8-22  mins $Therapeutic Exercise: 8-22 mins                    G Codes:     [name], PT  Acute Rehab Services  [phone]  [name], [name] 02/06/2014, 10:46 AM

## 2014-02-06 NOTE — Progress Notes (Signed)
VASCULAR LAB PRELIMINARY  PRELIMINARY  PRELIMINARY  PRELIMINARY  Right  Upper Extremity Vein Map    Cephalic  Segment Diameter Depth Comment  1. Axilla 2.2714mm mm   2. Mid upper arm 2.7902mm mm   3. Above AC 1.4691mm mm   4. In [facility] 3.1305[facility] mm Thrombus at IV site  5. Below AC mm mm   6. Mid forearm 2.7953mm mm   7. Wrist 3.5412mm mm    mm mm    mm mm    mm mm    Basilic  Segment Diameter Depth Comment  1. Axilla mm mm   2. Mid upper arm 4.3871mm 13mm   3. Above [facility] 3.524mm 18.557mm branch  4. In [facility] 3.2979[facility] 17.711mm   5. Below AC 1.4286mm 17.407mm   6. Mid forearm mm mm   7. Wrist mm mm    mm mm    mm mm    mm mm       Left Upper Extremity Vein Map    Cephalic  Segment Diameter Depth Comment  1. Axilla 2.6352mm mm   2. Mid upper arm 2.7126mm mm   3. Above AC 2.6584mm mm   4. In AC 3.378mm mm   5. Below AC 3.8864mm mm thrombus  6. Mid forearm 2.3197mm mm   7. Wrist 3.6055mm mm    mm mm    mm mm    mm mm    Basilic  Segment Diameter Depth Comment  1. Prox upper arm 4.3738mm 17.389mm   2. Mid upper arm 5.5906mm 17.379mm   3. Above [facility] 3.4066[facility] 12.596mm branch  4. In [facility] 2.3458mm mm branch  5. Below AC mm mm   6. Mid forearm mm mm   7. Wrist mm mm    mm mm    mm mm    mm mm     Farrel DemarkJill Eunice, RDMS, RVT  02/06/2014, 12:01 PM

## 2014-02-06 NOTE — Progress Notes (Signed)
Renal biopsy prelim per phone shows diffuse acute interstitial nephritis, some eosinophils, predominance of lymphocytes, severe arteriosclerosis.      Assessment/Plan: 1 AKI, nonoliguric Renal bx prelim as above. I am not optimistic about renal progniosis. PO steroids started yesterday. Will defer AV access for now 2 Obesity 3 COPD per primary  Subjective: Interval History: none.  Objective: Vital signs in last 24 hours: Temp:  [97.7 F (36.5 C)-99.8 F (37.7 C)] 99.1 F (37.3 C) (12/23 1130) Pulse Rate:  [79-118] 79 (12/23 1130) Resp:  [16-26] 20 (12/23 1130) BP: (75-138)/(42-83) 138/50 mmHg (12/23 1130) SpO2:  [96 %-98 %] 98 % (12/23 1130) Weight:  [83.5 kg (184 lb 1.4 oz)] 83.5 kg (184 lb 1.4 oz) (12/22 1545) Weight change:   Intake/Output from previous day: 12/22 0701 - 12/23 0700 In: 240 [P.O.:240] Out: -270  Intake/Output this shift: Total I/O In: 240 [P.O.:240] Out: -  No change in PE Lab Results:  Recent Labs  02/05/14 0531 02/06/14 0500  WBC 9.3 8.4  HGB 8.7* 8.1*  HCT 28.8* 26.9*  PLT 202 138*   BMET:  Recent Labs  02/05/14 0531 02/06/14 0500  NA 137 138  K 3.3* 3.8  CL 104 101  CO2 24 31  GLUCOSE 124* 98  BUN 39* 11  CREATININE 5.52* 3.10*  CALCIUM 9.4 8.3*   No results for input(s): PTH in the last 72 hours. Iron Studies: No results for input(s): IRON, TIBC, TRANSFERRIN, FERRITIN in the last 72 hours. Studies/Results: No results found.  Scheduled: . aspirin EC  81 mg Oral Daily  . darbepoetin (ARANESP) injection - DIALYSIS  150 mcg Intravenous Q Tue-HD  . feeding supplement (NEPRO CARB STEADY)  237 mL Oral BID BM  . heparin subcutaneous  5,000 Units Subcutaneous 3 times per day  . latanoprost  1 drop Both Eyes QHS  . levothyroxine  25 mcg Oral QAC breakfast  . loratadine  10 mg Oral Daily  . mometasone-formoterol  2 puff Inhalation BID  . ondansetron (ZOFRAN) IV  4 mg Intravenous TID AC  . pantoprazole  40 mg Oral Q1200  .  pilocarpine  1 drop Right Eye QID  . polyethylene glycol  17 g Oral Daily  . predniSONE  60 mg Oral Q breakfast  . senna-docusate  1 tablet Oral BID  . sodium chloride  3 mL Intravenous Q12H     LOS: 8 days   Kary Sugrue C 02/06/2014,12:50 PM

## 2014-02-07 LAB — RENAL FUNCTION PANEL
ANION GAP: 9 (ref 5–15)
Albumin: 2.7 g/dL — ABNORMAL LOW (ref 3.5–5.2)
BUN: 27 mg/dL — AB (ref 6–23)
CO2: 28 mmol/L (ref 19–32)
CREATININE: 3.79 mg/dL — AB (ref 0.50–1.10)
Calcium: 9.3 mg/dL (ref 8.4–10.5)
Chloride: 102 mEq/L (ref 96–112)
GFR calc Af Amer: 12 mL/min — ABNORMAL LOW (ref >90)
GFR calc non Af Amer: 11 mL/min — ABNORMAL LOW (ref >90)
GLUCOSE: 209 mg/dL — AB (ref 70–99)
POTASSIUM: 3.7 mmol/L (ref 3.5–5.1)
Phosphorus: 2.8 mg/dL (ref 2.3–4.6)
Sodium: 139 mmol/L (ref 135–145)

## 2014-02-07 MED ORDER — SODIUM CHLORIDE 0.9 % IV BOLUS (SEPSIS)
250.0000 mL | Freq: Once | INTRAVENOUS | Status: AC
  Administered 2014-02-07: 250 mL via INTRAVENOUS

## 2014-02-07 NOTE — Progress Notes (Signed)
PATIENT DETAILS Name: [name] Age: 79 y.o. Sex: female Date of Birth: 08-24-1934 Admit Date: 01/29/2014 Admitting Physician Benny Lennert, MD PCP:No primary care provider on file.  Brief Narrative From prior PN Patient is a 79 year old female who was recently discharged from this hospital on 01/21/14, she presented with ARF of unclear etiology,she briefly required HD, by discharge was improving, HD catheter was removed, and patient was discharged to SNF.Patient  Returned on 12/15 with worsening nausea, vomiting, diffuse abdominal pain. Labs showed further worsening in creatinine. Renal/VVS consulted, hemodialysis catheter placed and underwent hemodialysis. She also underwent a renal bx-results are currently pending. Etiology of renal failure seems unclear at present, at present not sure whether she will regain renal function, suspect she will be end-stage renal disease soon. Nephrology continues to follow.  Subjective: No major complaints  Assessment/Plan: Principal Problem:  Acute renal failure: Etiology not clear patient had renal biopsy and prelim shows diffuse acute interstitial nephritis, eosinophils, predominance of lymphocytes, severe arteriosclerosis.  - Nephrology on board and making further recommendations  Active Problems:   Nausea vomiting and abdominal pain: - No new complaints reported. - Will continue antiemetics PRN    Anemia of Renal Disease: on Aranesp    Hypothyroidism: - stable,  Continue with levothyroxine    GERD: - stable, continue PPI    COPD:  - stable, c/w prn bronchodilators.  Hypotension - Has had this problem today, I will administer 250 cc fluid bolus. - After bolus BP's improved     Failure to thrive syndrome:  - PT evaluation     Severe malnutrition in the context of acute illness or injury: - on Nepro feeding supplement  Disposition: Remain inpatient-suspect will need SNF on  discharge  Antibiotics: None  Anti-infectives    None      DVT Prophylaxis:  prophylatic heparin  Code Status: Full code  Family Communication Son-Keith at bedside  Procedures:  NONE  CONSULTS:  nephrology and vascular surgery  MEDICATIONS: Scheduled Meds: . aspirin EC  81 mg Oral Daily  . darbepoetin (ARANESP) injection - DIALYSIS  150 mcg Intravenous Q Tue-HD  . feeding supplement (NEPRO CARB STEADY)  237 mL Oral BID BM  . heparin subcutaneous  5,000 Units Subcutaneous 3 times per day  . latanoprost  1 drop Both Eyes QHS  . levothyroxine  25 mcg Oral QAC breakfast  . loratadine  10 mg Oral Daily  . mometasone-formoterol  2 puff Inhalation BID  . ondansetron (ZOFRAN) IV  4 mg Intravenous TID AC  . pantoprazole  40 mg Oral Q1200  . pilocarpine  1 drop Right Eye QID  . polyethylene glycol  17 g Oral Daily  . predniSONE  60 mg Oral Q breakfast  . senna-docusate  1 tablet Oral BID  . sodium chloride  3 mL Intravenous Q12H   Continuous Infusions: . sodium chloride 10 mL/hr at 01/30/14 1200  . sodium chloride 10 mL/hr at 02/01/14 1001  . sodium chloride     PRN Meds:.sodium chloride, sodium chloride, sodium chloride, acetaminophen **OR** acetaminophen, acetaminophen, albuterol, feeding supplement (NEPRO CARB STEADY), heparin, heparin, lactulose, lidocaine (PF), lidocaine-prilocaine, ondansetron **OR** ondansetron (ZOFRAN) IV, oxyCODONE-acetaminophen, pentafluoroprop-tetrafluoroeth, sodium chloride    PHYSICAL EXAM: Vital signs in last 24 hours: Filed Vitals:   02/07/14 0638 02/07/14 0830 02/07/14 1000 02/07/14 1033  BP: 103/44  77/34 98/52  Pulse: 110  106 112  Temp: 98.2 F (36.8 C)  98.6 F (37  C)   TempSrc: Oral  Oral   Resp: 16  18   Height:      Weight:      SpO2: 96% 97% 96% 97%    Weight change:  Filed Weights   02/03/14 2100 02/05/14 1203 02/05/14 1545  Weight: 83.734 kg (184 lb 9.6 oz) 83.3 kg (183 lb 10.3 oz) 83.5 kg (184 lb 1.4 oz)    Body mass index is 28 kg/(m^2).   Gen Exam: Awake and alert with clear speech.   Neck: Supple, No JVD.   Chest: CTA BL, no wheezes CVS:S1S2 Regular, no murmurs. Abdomen: soft, BS +, non tender, non distended.  Extremities: no edema, lower extremities warm to touch. Neurologic: No facial asymmetry, answers questions appropriately Skin: No Rash.   Wounds: N/A.    Intake/Output from previous day:  Intake/Output Summary (Last 24 hours) at 02/07/14 1509 Last data filed at 02/07/14 0900  Gross per 24 hour  Intake    480 ml  Output      0 ml  Net    480 ml     LAB RESULTS: CBC  Recent Labs Lab 02/01/14 1710 02/02/14 0440 02/03/14 0446 02/05/14 0531 02/06/14 0500  WBC 11.6* 11.4* 9.1 9.3 8.4  HGB 9.5* 9.0* 8.5* 8.7* 8.1*  HCT 30.6* 29.1* 28.2* 28.8* 26.9*  PLT 186 177 180 202 138*  MCV 91.9 92.4 93.1 93.2 95.4  MCH 28.5 28.6 28.1 28.2 28.7  MCHC 31.0 30.9 30.1 30.2 30.1  RDW 15.6* 15.6* 15.7* 15.3 15.3  LYMPHSABS  --   --  1.7  --   --   MONOABS  --   --  1.2*  --   --   EOSABS  --   --  0.8*  --   --   BASOSABS  --   --  0.2*  --   --     Chemistries   Recent Labs Lab 02/03/14 0446 02/04/14 0450 02/05/14 0531 02/06/14 0500 02/07/14 1215  NA 139 139 137 138 139  K 3.8 3.5* 3.3* 3.8 3.7  CL 100 99 104 101 102  CO2 26 26 24 31 28   GLUCOSE 90 98 124* 98 209*  BUN 28* 35* 39* 11 27*  CREATININE 4.26* 4.78* 5.52* 3.10* 3.79*  CALCIUM 8.8 9.6 9.4 8.3* 9.3    CBG:  Recent Labs Lab 02/05/14 0725  GLUCAP 118*    GFR Estimated Creatinine Clearance: 14.1 mL/min (by C-G formula based on Cr of 3.79).  Coagulation profile No results for input(s): INR, PROTIME in the last 168 hours.  Cardiac Enzymes No results for input(s): CKMB, TROPONINI, MYOGLOBIN in the last 168 hours.  Invalid input(s): CK  Invalid input(s): POCBNP No results for input(s): DDIMER in the last 72 hours. No results for input(s): HGBA1C in the last 72 hours. No results for  input(s): CHOL, HDL, LDLCALC, TRIG, CHOLHDL, LDLDIRECT in the last 72 hours. No results for input(s): TSH, T4TOTAL, T3FREE, THYROIDAB in the last 72 hours.  Invalid input(s): FREET3 No results for input(s): VITAMINB12, FOLATE, FERRITIN, TIBC, IRON, RETICCTPCT in the last 72 hours. No results for input(s): LIPASE, AMYLASE in the last 72 hours.  Urine Studies No results for input(s): UHGB, CRYS in the last 72 hours.  Invalid input(s): UACOL, UAPR, USPG, UPH, UTP, UGL, UKET, UBIL, UNIT, UROB, ULEU, UEPI, UWBC, URBC, UBAC, CAST, UCOM, BILUA  MICROBIOLOGY: No results found for this or any previous visit (from the past 240 hour(s)).  RADIOLOGY STUDIES/RESULTS: [place] Abdomen Complete  01/29/2014   CLINICAL DATA:  Abdominal pain, medical renal disease, hypertension  EXAM: ULTRASOUND ABDOMEN COMPLETE  COMPARISON:  Renal ultrasound of January 08, 2014  FINDINGS: Gallbladder: The gallbladder is adequately distended and contains multiple echogenic mobile shadowing stones. There is no gallbladder wall thickening, pericholecystic fluid, or positive sonographic Murphy's sign.  Common bile duct: Diameter: 6.5 mm  Liver: No focal lesion identified. Within normal limits in parenchymal echogenicity.  IVC: No abnormality visualized.  Pancreas: Largely obscured by bowel gas.  Spleen: Size and appearance within normal limits.  Right Kidney: Length: 13.8 cm. Echogenicity of the cortex is mildly increased. No mass or hydronephrosis visualized. There is a midpole cyst measuring 1.2 cm in diameter.  Left Kidney: Length: 12.5 cm. Echogenicity of the cortex is mildly increased. No mass or hydronephrosis visualized.  Abdominal aorta: No aneurysm visualized.  Other findings: None.  IMPRESSION: 1. Gallstones without evidence of acute cholecystitis. 2. Increased renal cortical echotexture consistent with medical renal disease. This is similar to that seen previously.   Electronically Signed   By: David  Swazil[place]Jordan   On: 01/29/2014  13:26   [place] Renal  01/08/2014   CLINICAL DATA:  Acute renal failure  EXAM: RENAL/URINARY TRACT ULTRASOUND COMPLETE  COMPARISON:  None.  FINDINGS: Right Kidney:  Length: 15 cm. Increased renal cortical echogenicity. No mass or hydronephrosis visualized.  Left Kidney:  Length: 12.3 cm. Increased renal cortical echogenicity. No mass or hydronephrosis visualized.  Bladder:  Appears normal for degree of bladder distention.  IMPRESSION: 1. No obstructive uropathy. 2. Increased renal cortical echogenicity as can be seen with medical renal disease.   Electronically Signed   By: Elige KoHetal  Patel   On: 01/08/2014 21:15   [place] Biopsy-no Radiologist  02/04/2014   : No report.  Please see physicians notes   Electronically Signed   By: Leslye Peerad  Services   On: 02/04/2014 11:47   Dg Chest Port 1 View  01/30/2014   CLINICAL DATA:  Dialysis catheter insertion.  EXAM: PORTABLE CHEST - 1 VIEW  COMPARISON:  01/29/2014.  FINDINGS: Trachea is midline. Heart size stable. Right IJ dialysis catheter tips project over the SVC. Biapical pleural thickening. Question developing airspace disease in the left lower lobe. Lungs are otherwise clear. No pneumothorax. No definite pleural fluid.  IMPRESSION: 1. Right IJ dialysis catheter insertion without pneumothorax. 2. Question developing left lower lobe airspace disease.   Electronically Signed   By: Leanna BattlesMelinda  Blietz M.D.   On: 01/30/2014 13:48   Dg Chest Port 1 View  01/29/2014   CLINICAL DATA:  New onset of nausea and shortness of Breath  EXAM: PORTABLE CHEST - 1 VIEW  COMPARISON:  01/09/2014  FINDINGS: Cardiomegaly again noted. Atherosclerotic calcifications of thoracic aorta. No acute infiltrate or pulmonary edema. Mild basilar atelectasis.  IMPRESSION: No active disease.  Mild basilar atelectasis.   Electronically Signed   By: Natasha MeadLiviu  Pop M.D.   On: 01/29/2014 08:38   Dg Chest Port 1 View  01/09/2014   CLINICAL DATA:  Hypoxia  EXAM: PORTABLE CHEST - 1 VIEW  COMPARISON:  01/08/2014   FINDINGS: Cardiomediastinal silhouette is stable. Mild elevation of the left hemidiaphragm. There is central mild vascular congestion and mild interstitial prominence suspicious for mild interstitial edema. Small left pleural effusion with left basilar atelectasis or infiltrate. Right IJ sheath is unchanged in position.  IMPRESSION: Mild vascular congestion mild interstitial prominence suspicious for mild interstitial edema. Small left pleural effusion with left basilar atelectasis or infiltrate.   Electronically Signed  By: Natasha Mead M.D.   On: 01/09/2014 10:45   Dg Chest Port 1 View  01/08/2014   CLINICAL DATA:  Evaluate central line placement.  EXAM: PORTABLE CHEST - 1 VIEW  COMPARISON:  01/08/2014  FINDINGS: There is a right IJ catheter with tip in the projection of the SVC. No pneumothorax identified. Mild cardiac enlargement. There is calcified plaque identified within the aortic arch. Pulmonary vascular congestion is noted. There is bibasilar atelectasis.  IMPRESSION: 1. Tip of the IJ catheter is in the SVC.  No pneumothorax. 2. Cardiac enlargement, atherosclerotic disease and pulmonary vascular congestion.   Electronically Signed   By: Signa Kell M.D.   On: 01/08/2014 23:52   Dg Abd Portable 1v  01/13/2014   CLINICAL DATA:  Generalized abdominal pain, nausea.  EXAM: PORTABLE ABDOMEN - 1 VIEW  COMPARISON:  January 08, 2014.  FINDINGS: The bowel gas pattern is normal. No radio-opaque calculi or other significant radiographic abnormality are seen.  IMPRESSION: No evidence of bowel obstruction or ileus.   Electronically Signed   By: Roque Lias M.D.   On: 01/13/2014 18:18   Dg Fluoro Guide Cv Line-no Report  01/30/2014   CLINICAL DATA:    FLOURO GUIDE CV LINE  Fluoroscopy was utilized by the requesting physician.  No radiographic  interpretation.     Penny Pia, MD  [facility] Pager:[phone]  If 7PM-7AM, please contact night-coverage [email or web address] Password  [email or web address] 02/07/2014, 3:09 PM   LOS: 9 days

## 2014-02-07 NOTE — Progress Notes (Signed)
     Assessment/Plan: 1 AKI, nonoliguric .Renal biopsy prelim shows diffuse acute interstitial nephritis, some eosinophils, predominance of lymphocytes, severe arteriosclerosis.  PO steroids day #3. Will defer AV access for now.  Will try to delay HD to see if UOP improved 2 Obesity 3 COPD per primary  Subjective: Interval History: Feeling better ? Due to steroids  Objective: Vital signs in last 24 hours: Temp:  [98.2 F (36.8 C)-99.1 F (37.3 C)] 98.6 F (37 C) (12/24 1000) Pulse Rate:  [79-112] 112 (12/24 1033) Resp:  [16-20] 18 (12/24 1000) BP: (77-138)/(34-52) 98/52 mmHg (12/24 1033) SpO2:  [96 %-99 %] 97 % (12/24 1033) Weight change:   Intake/Output from previous day: 12/23 0701 - 12/24 0700 In: 600 [P.O.:600] Out: -  Intake/Output this shift: Total I/O In: 240 [P.O.:240] Out: -   General appearance: alert and cooperative Resp: clear to auscultation bilaterally Chest wall: no tenderness Extremities: edema 1-2+  Cor RRR  Lab Results:  Recent Labs  02/05/14 0531 02/06/14 0500  WBC 9.3 8.4  HGB 8.7* 8.1*  HCT 28.8* 26.9*  PLT 202 138*   BMET:  Recent Labs  02/05/14 0531 02/06/14 0500  NA 137 138  K 3.3* 3.8  CL 104 101  CO2 24 31  GLUCOSE 124* 98  BUN 39* 11  CREATININE 5.52* 3.10*  CALCIUM 9.4 8.3*   No results for input(s): PTH in the last 72 hours. Iron Studies: No results for input(s): IRON, TIBC, TRANSFERRIN, FERRITIN in the last 72 hours. Studies/Results: No results found.  Scheduled: . aspirin EC  81 mg Oral Daily  . darbepoetin (ARANESP) injection - DIALYSIS  150 mcg Intravenous Q Tue-HD  . feeding supplement (NEPRO CARB STEADY)  237 mL Oral BID BM  . heparin subcutaneous  5,000 Units Subcutaneous 3 times per day  . latanoprost  1 drop Both Eyes QHS  . levothyroxine  25 mcg Oral QAC breakfast  . loratadine  10 mg Oral Daily  . mometasone-formoterol  2 puff Inhalation BID  . ondansetron (ZOFRAN) IV  4 mg Intravenous TID AC   . pantoprazole  40 mg Oral Q1200  . pilocarpine  1 drop Right Eye QID  . polyethylene glycol  17 g Oral Daily  . predniSONE  60 mg Oral Q breakfast  . senna-docusate  1 tablet Oral BID  . sodium chloride  3 mL Intravenous Q12H      LOS: 9 days   Gargi Berch C 02/07/2014,11:26 AM

## 2014-02-08 LAB — RENAL FUNCTION PANEL
ANION GAP: 10 (ref 5–15)
Albumin: 2.7 g/dL — ABNORMAL LOW (ref 3.5–5.2)
BUN: 38 mg/dL — ABNORMAL HIGH (ref 6–23)
CO2: 28 mmol/L (ref 19–32)
Calcium: 9.4 mg/dL (ref 8.4–10.5)
Chloride: 105 mEq/L (ref 96–112)
Creatinine, Ser: 3.68 mg/dL — ABNORMAL HIGH (ref 0.50–1.10)
GFR, EST AFRICAN AMERICAN: 13 mL/min — AB (ref >90)
GFR, EST NON AFRICAN AMERICAN: 11 mL/min — AB (ref >90)
Glucose, Bld: 102 mg/dL — ABNORMAL HIGH (ref 70–99)
POTASSIUM: 3.6 mmol/L (ref 3.5–5.1)
Phosphorus: 3.3 mg/dL (ref 2.3–4.6)
SODIUM: 143 mmol/L (ref 135–145)

## 2014-02-08 NOTE — Progress Notes (Signed)
PATIENT DETAILS Name: [name] Age: 79 y.o. Sex: female Date of Birth: 1934-06-07 Admit Date: 01/29/2014 Admitting Physician Benny LennertJoseph L Zammit, MD PCP:No primary care provider on file.  Brief Narrative From prior PN Patient is a 79 year old female who was recently discharged from this hospital on 01/21/14, she presented with ARF of unclear etiology,she briefly required HD, by discharge was improving, HD catheter was removed, and patient was discharged to SNF.Patient  Returned on 12/15 with worsening nausea, vomiting, diffuse abdominal pain. Labs showed further worsening in creatinine. Renal/VVS consulted, hemodialysis catheter placed and underwent hemodialysis. She also underwent a renal bx-results are currently pending. Etiology of renal failure seems unclear at present, at present not sure whether she will regain renal function, suspect she will be end-stage renal disease soon. Nephrology continues to follow.  Subjective: No major complaints  Assessment/Plan: Principal Problem:  Acute renal failure: Etiology not clear patient had renal biopsy and prelim shows diffuse acute interstitial nephritis, eosinophils, predominance of lymphocytes, severe arteriosclerosis.  - Nephrology on board and making further recommendations  Active Problems:   Nausea vomiting and abdominal pain: - resolving - Continue antiemetics PRN    Anemia of Renal Disease: on Aranesp    Hypothyroidism: - stable,  Continue with levothyroxine    GERD: - stable, continue PPI    COPD:  - stable, c/w prn bronchodilators.  Hypotension - resolving    Failure to thrive syndrome:  - PT evaluation     Severe malnutrition in the context of acute illness or injury: - on Nepro feeding supplement  Disposition: Remain inpatient-suspect will need SNF on discharge  Antibiotics: None  Anti-infectives    None      DVT Prophylaxis:  prophylatic heparin  Code Status: Full code  Family  Communication Son-Keith at bedside  Procedures:  NONE  CONSULTS:  nephrology and vascular surgery  MEDICATIONS: Scheduled Meds: . aspirin EC  81 mg Oral Daily  . darbepoetin (ARANESP) injection - DIALYSIS  150 mcg Intravenous Q Tue-HD  . feeding supplement (NEPRO CARB STEADY)  237 mL Oral BID BM  . heparin subcutaneous  5,000 Units Subcutaneous 3 times per day  . latanoprost  1 drop Both Eyes QHS  . levothyroxine  25 mcg Oral QAC breakfast  . loratadine  10 mg Oral Daily  . mometasone-formoterol  2 puff Inhalation BID  . ondansetron (ZOFRAN) IV  4 mg Intravenous TID AC  . pantoprazole  40 mg Oral Q1200  . pilocarpine  1 drop Right Eye QID  . polyethylene glycol  17 g Oral Daily  . predniSONE  60 mg Oral Q breakfast  . senna-docusate  1 tablet Oral BID  . sodium chloride  3 mL Intravenous Q12H   Continuous Infusions: . sodium chloride 10 mL/hr at 01/30/14 1200  . sodium chloride 10 mL/hr at 02/01/14 1001  . sodium chloride     PRN Meds:.sodium chloride, sodium chloride, sodium chloride, acetaminophen **OR** acetaminophen, acetaminophen, albuterol, feeding supplement (NEPRO CARB STEADY), heparin, heparin, lactulose, lidocaine (PF), lidocaine-prilocaine, ondansetron **OR** ondansetron (ZOFRAN) IV, oxyCODONE-acetaminophen, pentafluoroprop-tetrafluoroeth, sodium chloride    PHYSICAL EXAM: Vital signs in last 24 hours: Filed Vitals:   02/07/14 2113 02/08/14 0551 02/08/14 0922 02/08/14 1000  BP: 122/70 104/49  115/61  Pulse: 108 100  121  Temp: 98.1 F (36.7 C) 97.6 F (36.4 C)  98.4 F (36.9 C)  TempSrc: Oral Oral  Oral  Resp: 17 16    Height:  Weight:      SpO2: 97% 99% 98% 98%    Weight change:  Filed Weights   02/03/14 2100 02/05/14 1203 02/05/14 1545  Weight: 83.734 kg (184 lb 9.6 oz) 83.3 kg (183 lb 10.3 oz) 83.5 kg (184 lb 1.4 oz)   Body mass index is 28 kg/(m^2).   Gen Exam: Awake and alert with clear speech.   Neck: Supple, No JVD.   Chest: CTA BL,  no wheezes CVS:S1S2 Regular, no murmurs. Abdomen: soft, BS +, non tender, non distended.  Extremities: no edema, lower extremities warm to touch. Neurologic: No facial asymmetry, answers questions appropriately Skin: No Rash.   Wounds: N/A.    Intake/Output from previous day:  Intake/Output Summary (Last 24 hours) at 02/08/14 1305 Last data filed at 02/08/14 0900  Gross per 24 hour  Intake    240 ml  Output      0 ml  Net    240 ml     LAB RESULTS: CBC  Recent Labs Lab 02/01/14 1710 02/02/14 0440 02/03/14 0446 02/05/14 0531 02/06/14 0500  WBC 11.6* 11.4* 9.1 9.3 8.4  HGB 9.5* 9.0* 8.5* 8.7* 8.1*  HCT 30.6* 29.1* 28.2* 28.8* 26.9*  PLT 186 177 180 202 138*  MCV 91.9 92.4 93.1 93.2 95.4  MCH 28.5 28.6 28.1 28.2 28.7  MCHC 31.0 30.9 30.1 30.2 30.1  RDW 15.6* 15.6* 15.7* 15.3 15.3  LYMPHSABS  --   --  1.7  --   --   MONOABS  --   --  1.2*  --   --   EOSABS  --   --  0.8*  --   --   BASOSABS  --   --  0.2*  --   --     Chemistries   Recent Labs Lab 02/04/14 0450 02/05/14 0531 02/06/14 0500 02/07/14 1215 02/08/14 0442  NA 139 137 138 139 143  K 3.5* 3.3* 3.8 3.7 3.6  CL 99 104 101 102 105  CO2 26 24 31 28 28   GLUCOSE 98 124* 98 209* 102*  BUN 35* 39* 11 27* 38*  CREATININE 4.78* 5.52* 3.10* 3.79* 3.68*  CALCIUM 9.6 9.4 8.3* 9.3 9.4    CBG:  Recent Labs Lab 02/05/14 0725  GLUCAP 118*    GFR Estimated Creatinine Clearance: 14.5 mL/min (by C-G formula based on Cr of 3.68).  Coagulation profile No results for input(s): INR, PROTIME in the last 168 hours.  Cardiac Enzymes No results for input(s): CKMB, TROPONINI, MYOGLOBIN in the last 168 hours.  Invalid input(s): CK  Invalid input(s): POCBNP No results for input(s): DDIMER in the last 72 hours. No results for input(s): HGBA1C in the last 72 hours. No results for input(s): CHOL, HDL, LDLCALC, TRIG, CHOLHDL, LDLDIRECT in the last 72 hours. No results for input(s): TSH, T4TOTAL, T3FREE,  THYROIDAB in the last 72 hours.  Invalid input(s): FREET3 No results for input(s): VITAMINB12, FOLATE, FERRITIN, TIBC, IRON, RETICCTPCT in the last 72 hours. No results for input(s): LIPASE, AMYLASE in the last 72 hours.  Urine Studies No results for input(s): UHGB, CRYS in the last 72 hours.  Invalid input(s): UACOL, UAPR, USPG, UPH, UTP, UGL, UKET, UBIL, UNIT, UROB, ULEU, UEPI, UWBC, URBC, UBAC, CAST, UCOM, BILUA  MICROBIOLOGY: No results found for this or any previous visit (from the past 240 hour(s)).  RADIOLOGY STUDIES/RESULTS: [place] Abdomen Complete  01/29/2014   CLINICAL DATA:  Abdominal pain, medical renal disease, hypertension  EXAM: ULTRASOUND ABDOMEN COMPLETE  COMPARISON:  Renal ultrasound of January 08, 2014  FINDINGS: Gallbladder: The gallbladder is adequately distended and contains multiple echogenic mobile shadowing stones. There is no gallbladder wall thickening, pericholecystic fluid, or positive sonographic Murphy's sign.  Common bile duct: Diameter: 6.5 mm  Liver: No focal lesion identified. Within normal limits in parenchymal echogenicity.  IVC: No abnormality visualized.  Pancreas: Largely obscured by bowel gas.  Spleen: Size and appearance within normal limits.  Right Kidney: Length: 13.8 cm. Echogenicity of the cortex is mildly increased. No mass or hydronephrosis visualized. There is a midpole cyst measuring 1.2 cm in diameter.  Left Kidney: Length: 12.5 cm. Echogenicity of the cortex is mildly increased. No mass or hydronephrosis visualized.  Abdominal aorta: No aneurysm visualized.  Other findings: None.  IMPRESSION: 1. Gallstones without evidence of acute cholecystitis. 2. Increased renal cortical echotexture consistent with medical renal disease. This is similar to that seen previously.   Electronically Signed   By: David  [place]   On: 01/29/2014 13:26   US Biopsy-no Radiologist  02/04/2014   : No report.  Please see physicians notes   Electronically Signed   By: Leslye Peer   On: 02/04/2014 11:47   Dg Chest Port 1 View  01/30/2014   CLINICAL DATA:  Dialysis catheter insertion.  EXAM: PORTABLE CHEST - 1 VIEW  COMPARISON:  01/29/2014.  FINDINGS: Trachea is midline. Heart size stable. Right IJ dialysis catheter tips project over the SVC. Biapical pleural thickening. Question developing airspace disease in the left lower lobe. Lungs are otherwise clear. No pneumothorax. No definite pleural fluid.  IMPRESSION: 1. Right IJ dialysis catheter insertion without pneumothorax. 2. Question developing left lower lobe airspace disease.   Electronically Signed   By: Leanna Battles M.D.   On: 01/30/2014 13:48   Dg Chest Port 1 View  01/29/2014   CLINICAL DATA:  New onset of nausea and shortness of Breath  EXAM: PORTABLE CHEST - 1 VIEW  COMPARISON:  01/09/2014  FINDINGS: Cardiomegaly again noted. Atherosclerotic calcifications of thoracic aorta. No acute infiltrate or pulmonary edema. Mild basilar atelectasis.  IMPRESSION: No active disease.  Mild basilar atelectasis.   Electronically Signed   By: Natasha Mead M.D.   On: 01/29/2014 08:38   Dg Abd Portable 1v  01/13/2014   CLINICAL DATA:  Generalized abdominal pain, nausea.  EXAM: PORTABLE ABDOMEN - 1 VIEW  COMPARISON:  January 08, 2014.  FINDINGS: The bowel gas pattern is normal. No radio-opaque calculi or other significant radiographic abnormality are seen.  IMPRESSION: No evidence of bowel obstruction or ileus.   Electronically Signed   By: Roque Lias M.D.   On: 01/13/2014 18:18   Dg Fluoro Guide Cv Line-no Report  01/30/2014   CLINICAL DATA:    FLOURO GUIDE CV LINE  Fluoroscopy was utilized by the requesting physician.  No radiographic  interpretation.     Penny Pia, MD  [facility] Pager:[phone]  If 7PM-7AM, please contact night-coverage [email or web address] Password [email or web address] 02/08/2014, 1:05 PM   LOS: 10 days

## 2014-02-08 NOTE — Progress Notes (Signed)
BP 92/46. Asymptomatic. MD on call notified.  No new orders received.  Will continue to monitor.

## 2014-02-08 NOTE — Progress Notes (Signed)
Patient and family member refusing bed alarm this evening shift.  Re-educated patient and family on importance of utilizing bed alarm.  Non-skid socks on.  Bed in lowest position.  Emphasized use of call bell if needed to use the bathroom; verbalized understanding.  Will continue to monitor patient.

## 2014-02-08 NOTE — Progress Notes (Signed)
Assessment/Plan: 1 AKI, nonoliguric and possible recovery phase.(Renal biopsy prelim shows diffuse acute interstitial nephritis, some eosinophils, predominance of lymphocytes, severe arteriosclerosis.) PO steroids day #4. F/U labs in AM, may need to reduce steroids for BS.  2 Obesity 3 COPD per primary 4 Glucose intolerance on steroids  Subjective: Interval History: Voided 4 times over night!  Not recorded!  Objective: Vital signs in last 24 hours: Temp:  [97.6 F (36.4 C)-98.4 F (36.9 C)] 98.4 F (36.9 C) (12/25 1000) Pulse Rate:  [96-121] 121 (12/25 1000) Resp:  [16-17] 16 (12/25 0551) BP: (104-122)/(49-70) 115/61 mmHg (12/25 1000) SpO2:  [97 %-99 %] 98 % (12/25 1000) Weight change:   Intake/Output from previous day: 12/24 0701 - 12/25 0700 In: 240 [P.O.:240] Out: -  Intake/Output this shift: Total I/O In: 240 [P.O.:240] Out: -   PE Lungs clear Cor RRR Abd soft Ext 1+ edema Gen weak  Lab Results:  Recent Labs  02/06/14 0500  WBC 8.4  HGB 8.1*  HCT 26.9*  PLT 138*   BMET:  Recent Labs  02/07/14 1215 02/08/14 0442  NA 139 143  K 3.7 3.6  CL 102 105  CO2 28 28  GLUCOSE 209* 102*  BUN 27* 38*  CREATININE 3.79* 3.68*  CALCIUM 9.3 9.4   No results for input(s): PTH in the last 72 hours. Iron Studies: No results for input(s): IRON, TIBC, TRANSFERRIN, FERRITIN in the last 72 hours. Studies/Results: No results found.    LOS: 10 days   Michalle Rademaker C 02/08/2014,3:41 PM

## 2014-02-09 LAB — RENAL FUNCTION PANEL
ALBUMIN: 2.6 g/dL — AB (ref 3.5–5.2)
ANION GAP: 11 (ref 5–15)
BUN: 38 mg/dL — ABNORMAL HIGH (ref 6–23)
CO2: 26 mmol/L (ref 19–32)
Calcium: 9.1 mg/dL (ref 8.4–10.5)
Chloride: 103 mEq/L (ref 96–112)
Creatinine, Ser: 3.48 mg/dL — ABNORMAL HIGH (ref 0.50–1.10)
GFR calc Af Amer: 14 mL/min — ABNORMAL LOW (ref >90)
GFR, EST NON AFRICAN AMERICAN: 12 mL/min — AB (ref >90)
Glucose, Bld: 86 mg/dL (ref 70–99)
PHOSPHORUS: 3 mg/dL (ref 2.3–4.6)
POTASSIUM: 3.8 mmol/L (ref 3.5–5.1)
SODIUM: 140 mmol/L (ref 135–145)

## 2014-02-09 NOTE — Progress Notes (Signed)
PATIENT DETAILS Name: [name] Age: 79 y.o. Sex: female Date of Birth: 07/29/1934 Admit Date: 01/29/2014 Admitting Physician Benny LennertJoseph L Zammit, MD PCP:No primary care provider on file.  Brief Narrative From prior PN Patient is a 79 year old female who was recently discharged from this hospital on 01/21/14, she presented with ARF of unclear etiology,she briefly required HD, by discharge was improving, HD catheter was removed, and patient was discharged to SNF.Patient  Returned on 12/15 with worsening nausea, vomiting, diffuse abdominal pain. Labs showed further worsening in creatinine. Renal/VVS consulted, hemodialysis catheter placed and underwent hemodialysis. She also underwent a renal bx-results are currently pending. Etiology of renal failure seems unclear at present, at present not sure whether she will regain renal function, suspect she will be end-stage renal disease soon. Nephrology continues to follow.  Subjective: No major complaints  Assessment/Plan: Principal Problem:  Acute renal failure: Etiology not clear patient had renal biopsy and prelim shows diffuse acute interstitial nephritis, eosinophils, predominance of lymphocytes, severe arteriosclerosis.  - Nephrology on board and making further recommendations  Active Problems:   Nausea vomiting and abdominal pain: - resolving - Continue antiemetics PRN    Anemia of Renal Disease: on Aranesp    Hypothyroidism: - stable,  Continue with levothyroxine    GERD: - stable, continue PPI    COPD:  - stable, c/w prn bronchodilators.  Hypotension - resolving, continues to have soft blood pressures - 250cc Fluid boluses when necessary    Failure to thrive syndrome:  - PT evaluation     Severe malnutrition in the context of acute illness or injury: - on Nepro feeding supplement  Disposition: Remain inpatient-suspect will need SNF on discharge  Antibiotics: None  Anti-infectives    None      DVT  Prophylaxis:  prophylatic heparin  Code Status: Full code  Family Communication Son-Keith at bedside  Procedures:  NONE  CONSULTS:  nephrology and vascular surgery  MEDICATIONS: Scheduled Meds: . aspirin EC  81 mg Oral Daily  . darbepoetin (ARANESP) injection - DIALYSIS  150 mcg Intravenous Q Tue-HD  . feeding supplement (NEPRO CARB STEADY)  237 mL Oral BID BM  . heparin subcutaneous  5,000 Units Subcutaneous 3 times per day  . latanoprost  1 drop Both Eyes QHS  . levothyroxine  25 mcg Oral QAC breakfast  . loratadine  10 mg Oral Daily  . mometasone-formoterol  2 puff Inhalation BID  . ondansetron (ZOFRAN) IV  4 mg Intravenous TID AC  . pantoprazole  40 mg Oral Q1200  . pilocarpine  1 drop Right Eye QID  . polyethylene glycol  17 g Oral Daily  . predniSONE  60 mg Oral Q breakfast  . senna-docusate  1 tablet Oral BID  . sodium chloride  3 mL Intravenous Q12H   Continuous Infusions: . sodium chloride 10 mL/hr at 01/30/14 1200  . sodium chloride 10 mL/hr at 02/01/14 1001  . sodium chloride     PRN Meds:.sodium chloride, sodium chloride, sodium chloride, acetaminophen **OR** acetaminophen, acetaminophen, albuterol, feeding supplement (NEPRO CARB STEADY), heparin, heparin, lactulose, lidocaine (PF), lidocaine-prilocaine, ondansetron **OR** ondansetron (ZOFRAN) IV, oxyCODONE-acetaminophen, pentafluoroprop-tetrafluoroeth, sodium chloride    PHYSICAL EXAM: Vital signs in last 24 hours: Filed Vitals:   02/08/14 2102 02/08/14 2139 02/09/14 0317 02/09/14 0500  BP:  92/46 100/59 102/56  Pulse:  98  96  Temp:  98.2 F (36.8 C)  98.5 F (36.9 C)  TempSrc:  Oral  Oral  Resp:  18  18  Height:      Weight:      SpO2: 97% 98%  97%    Weight change:  Filed Weights   02/03/14 2100 02/05/14 1203 02/05/14 1545  Weight: 83.734 kg (184 lb 9.6 oz) 83.3 kg (183 lb 10.3 oz) 83.5 kg (184 lb 1.4 oz)   Body mass index is 28 kg/(m^2).   Gen Exam: Awake and alert with clear speech.    Neck: Supple, No JVD.   Chest: CTA BL, no wheezes CVS:S1S2 Regular, no murmurs. Abdomen: soft, BS +, non tender, non distended.  Extremities: no edema, lower extremities warm to touch. Neurologic: No facial asymmetry, answers questions appropriately Skin: No Rash.   Wounds: N/A.    Intake/Output from previous day:  Intake/Output Summary (Last 24 hours) at 02/09/14 1532 Last data filed at 02/09/14 0936  Gross per 24 hour  Intake    603 ml  Output    900 ml  Net   -297 ml     LAB RESULTS: CBC  Recent Labs Lab 02/03/14 0446 02/05/14 0531 02/06/14 0500  WBC 9.1 9.3 8.4  HGB 8.5* 8.7* 8.1*  HCT 28.2* 28.8* 26.9*  PLT 180 202 138*  MCV 93.1 93.2 95.4  MCH 28.1 28.2 28.7  MCHC 30.1 30.2 30.1  RDW 15.7* 15.3 15.3  LYMPHSABS 1.7  --   --   MONOABS 1.2*  --   --   EOSABS 0.8*  --   --   BASOSABS 0.2*  --   --     Chemistries   Recent Labs Lab 02/05/14 0531 02/06/14 0500 02/07/14 1215 02/08/14 0442 02/09/14 0432  NA 137 138 139 143 140  K 3.3* 3.8 3.7 3.6 3.8  CL 104 101 102 105 103  CO2 24 31 28 28 26   GLUCOSE 124* 98 209* 102* 86  BUN 39* 11 27* 38* 38*  CREATININE 5.52* 3.10* 3.79* 3.68* 3.48*  CALCIUM 9.4 8.3* 9.3 9.4 9.1    CBG:  Recent Labs Lab 02/05/14 0725  GLUCAP 118*    GFR Estimated Creatinine Clearance: 15.3 mL/min (by C-G formula based on Cr of 3.48).  Coagulation profile No results for input(s): INR, PROTIME in the last 168 hours.  Cardiac Enzymes No results for input(s): CKMB, TROPONINI, MYOGLOBIN in the last 168 hours.  Invalid input(s): CK  Invalid input(s): POCBNP No results for input(s): DDIMER in the last 72 hours. No results for input(s): HGBA1C in the last 72 hours. No results for input(s): CHOL, HDL, LDLCALC, TRIG, CHOLHDL, LDLDIRECT in the last 72 hours. No results for input(s): TSH, T4TOTAL, T3FREE, THYROIDAB in the last 72 hours.  Invalid input(s): FREET3 No results for input(s): VITAMINB12, FOLATE, FERRITIN,  TIBC, IRON, RETICCTPCT in the last 72 hours. No results for input(s): LIPASE, AMYLASE in the last 72 hours.  Urine Studies No results for input(s): UHGB, CRYS in the last 72 hours.  Invalid input(s): UACOL, UAPR, USPG, UPH, UTP, UGL, UKET, UBIL, UNIT, UROB, ULEU, UEPI, UWBC, URBC, UBAC, CAST, UCOM, BILUA  MICROBIOLOGY: No results found for this or any previous visit (from the past 240 hour(s)).  RADIOLOGY STUDIES/RESULTS: US Abdomen Complete  01/29/2014   CLINICAL DATA:  Abdominal pain, medical renal disease, hypertension  EXAM: ULTRASOUND ABDOMEN COMPLETE  COMPARISON:  Renal ultrasound of January 08, 2014  FINDINGS: Gallbladder: The gallbladder is adequately distended and contains multiple echogenic mobile shadowing stones. There is no gallbladder wall thickening, pericholecystic fluid, or positive sonographic Murphy's sign.  Common bile duct: Diameter: 6.5 mm  Liver: No focal lesion identified. Within normal limits in parenchymal echogenicity.  IVC: No abnormality visualized.  Pancreas: Largely obscured by bowel gas.  Spleen: Size and appearance within normal limits.  Right Kidney: Length: 13.8 cm. Echogenicity of the cortex is mildly increased. No mass or hydronephrosis visualized. There is a midpole cyst measuring 1.2 cm in diameter.  Left Kidney: Length: 12.5 cm. Echogenicity of the cortex is mildly increased. No mass or hydronephrosis visualized.  Abdominal aorta: No aneurysm visualized.  Other findings: None.  IMPRESSION: 1. Gallstones without evidence of acute cholecystitis. 2. Increased renal cortical echotexture consistent with medical renal disease. This is similar to that seen previously.   Electronically Signed   By: David  [place]   On: 01/29/2014 13:26   [place] Biopsy-no Radiologist  02/04/2014   : No report.  Please see physicians notes   Electronically Signed   By: Leslye Peerad  Services   On: 02/04/2014 11:47   Dg Chest Port 1 View  01/30/2014   CLINICAL DATA:  Dialysis catheter insertion.   EXAM: PORTABLE CHEST - 1 VIEW  COMPARISON:  01/29/2014.  FINDINGS: Trachea is midline. Heart size stable. Right IJ dialysis catheter tips project over the SVC. Biapical pleural thickening. Question developing airspace disease in the left lower lobe. Lungs are otherwise clear. No pneumothorax. No definite pleural fluid.  IMPRESSION: 1. Right IJ dialysis catheter insertion without pneumothorax. 2. Question developing left lower lobe airspace disease.   Electronically Signed   By: Leanna BattlesMelinda  Blietz M.D.   On: 01/30/2014 13:48   Dg Chest Port 1 View  01/29/2014   CLINICAL DATA:  New onset of nausea and shortness of Breath  EXAM: PORTABLE CHEST - 1 VIEW  COMPARISON:  01/09/2014  FINDINGS: Cardiomegaly again noted. Atherosclerotic calcifications of thoracic aorta. No acute infiltrate or pulmonary edema. Mild basilar atelectasis.  IMPRESSION: No active disease.  Mild basilar atelectasis.   Electronically Signed   By: Natasha MeadLiviu  Pop M.D.   On: 01/29/2014 08:38   Dg Abd Portable 1v  01/13/2014   CLINICAL DATA:  Generalized abdominal pain, nausea.  EXAM: PORTABLE ABDOMEN - 1 VIEW  COMPARISON:  January 08, 2014.  FINDINGS: The bowel gas pattern is normal. No radio-opaque calculi or other significant radiographic abnormality are seen.  IMPRESSION: No evidence of bowel obstruction or ileus.   Electronically Signed   By: Roque LiasJames  Green M.D.   On: 01/13/2014 18:18   Dg Fluoro Guide Cv Line-no Report  01/30/2014   CLINICAL DATA:    FLOURO GUIDE CV LINE  Fluoroscopy was utilized by the requesting physician.  No radiographic  interpretation.     Penny PiaVEGA, Robby Bulkley, MD  [facility] Pager:[phone]  If 7PM-7AM, please contact night-coverage [email or web address] Password [email or web address] 02/09/2014, 3:32 PM   LOS: 11 days

## 2014-02-09 NOTE — Progress Notes (Signed)
S: Appetite marginal O:BP 102/56 mmHg  Pulse 96  Temp(Src) 98.5 F (36.9 C) (Oral)  Resp 18  Ht 5\' 8"  (1.727 m)  Wt 83.5 kg (184 lb 1.4 oz)  BMI 28.00 kg/m2  SpO2 97%  Intake/Output Summary (Last 24 hours) at 02/09/14 1105 Last data filed at 02/09/14 0936  Gross per 24 hour  Intake    603 ml  Output   1250 ml  Net   -647 ml   Weight change:  JJO:ACZYSGen:awake and alert CVS:RRR Resp:clear Abd:+ BS NTND Ext:no edema NEURO:CNI Ox3 no asterixis Rt IJ permcath   . aspirin EC  81 mg Oral Daily  . darbepoetin (ARANESP) injection - DIALYSIS  150 mcg Intravenous Q Tue-HD  . feeding supplement (NEPRO CARB STEADY)  237 mL Oral BID BM  . heparin subcutaneous  5,000 Units Subcutaneous 3 times per day  . latanoprost  1 drop Both Eyes QHS  . levothyroxine  25 mcg Oral QAC breakfast  . loratadine  10 mg Oral Daily  . mometasone-formoterol  2 puff Inhalation BID  . ondansetron (ZOFRAN) IV  4 mg Intravenous TID AC  . pantoprazole  40 mg Oral Q1200  . pilocarpine  1 drop Right Eye QID  . polyethylene glycol  17 g Oral Daily  . predniSONE  60 mg Oral Q breakfast  . senna-docusate  1 tablet Oral BID  . sodium chloride  3 mL Intravenous Q12H   No results found. BMET    Component Value Date/Time   NA 140 02/09/2014 0432   K 3.8 02/09/2014 0432   CL 103 02/09/2014 0432   CO2 26 02/09/2014 0432   GLUCOSE 86 02/09/2014 0432   BUN 38* 02/09/2014 0432   CREATININE 3.48* 02/09/2014 0432   CALCIUM 9.1 02/09/2014 0432   GFRNONAA 12* 02/09/2014 0432   GFRAA 14* 02/09/2014 0432   CBC    Component Value Date/Time   WBC 8.4 02/06/2014 0500   RBC 2.82* 02/06/2014 0500   HGB 8.1* 02/06/2014 0500   HCT 26.9* 02/06/2014 0500   PLT 138* 02/06/2014 0500   MCV 95.4 02/06/2014 0500   MCH 28.7 02/06/2014 0500   MCHC 30.1 02/06/2014 0500   RDW 15.3 02/06/2014 0500   LYMPHSABS 1.7 02/03/2014 0446   MONOABS 1.2* 02/03/2014 0446   EOSABS 0.8* 02/03/2014 0446   BASOSABS 0.2* 02/03/2014 0446      Assessment: 1. AKI sec Acute interstitial nephritis on steroids.  Last HD 12/22.  Scr has remained stable in the 3's since then 2. Anemia, Hg drifting down and Plt ct lower?  Plan: 1. Cont steroids 2.  [place].  I think she should be more stable first and daughter needs to line up PCP and nephrologist first. 3.  Will need permcath removed prior to DC if Scr remains stable/improved   Kristin Barcus T

## 2014-02-10 LAB — CBC
HCT: 28.1 % — ABNORMAL LOW (ref 36.0–46.0)
Hemoglobin: 8.6 g/dL — ABNORMAL LOW (ref 12.0–15.0)
MCH: 29.6 pg (ref 26.0–34.0)
MCHC: 30.6 g/dL (ref 30.0–36.0)
MCV: 96.6 fL (ref 78.0–100.0)
Platelets: 241 K/uL (ref 150–400)
RBC: 2.91 MIL/uL — ABNORMAL LOW (ref 3.87–5.11)
RDW: 15.9 % — ABNORMAL HIGH (ref 11.5–15.5)
WBC: 13.2 K/uL — ABNORMAL HIGH (ref 4.0–10.5)

## 2014-02-10 LAB — RENAL FUNCTION PANEL
Albumin: 2.5 g/dL — ABNORMAL LOW (ref 3.5–5.2)
Anion gap: 9 (ref 5–15)
BUN: 44 mg/dL — ABNORMAL HIGH (ref 6–23)
CALCIUM: 9.4 mg/dL (ref 8.4–10.5)
CO2: 27 mmol/L (ref 19–32)
CREATININE: 3.19 mg/dL — AB (ref 0.50–1.10)
Chloride: 104 mEq/L (ref 96–112)
GFR calc Af Amer: 15 mL/min — ABNORMAL LOW (ref >90)
GFR, EST NON AFRICAN AMERICAN: 13 mL/min — AB (ref >90)
Glucose, Bld: 101 mg/dL — ABNORMAL HIGH (ref 70–99)
Phosphorus: 2.9 mg/dL (ref 2.3–4.6)
Potassium: 3.6 mmol/L (ref 3.5–5.1)
Sodium: 140 mmol/L (ref 135–145)

## 2014-02-10 NOTE — Progress Notes (Signed)
PATIENT DETAILS Name: [name] Age: 79 y.o. Sex: female Date of Birth: 05-18-1934 Admit Date: 01/29/2014 Admitting Physician Benny LennertJoseph L Zammit, MD PCP:No primary care provider on file.  Brief Narrative From prior PN Patient is a 79 year old female who was recently discharged from this hospital on 01/21/14, she presented with ARF of unclear etiology,she briefly required HD, by discharge was improving, HD catheter was removed, and patient was discharged to SNF.Patient  Returned on 12/15 with worsening nausea, vomiting, diffuse abdominal pain. Labs showed further worsening in creatinine. Renal/VVS consulted, hemodialysis catheter placed and underwent hemodialysis. She also underwent a renal bx-results are currently pending. Etiology of renal failure seems unclear at present, at present not sure whether she will regain renal function, suspect she will be end-stage renal disease soon. Nephrology continues to follow.  Subjective: No major complaints  Assessment/Plan: Principal Problem:  Acute renal failure: Etiology not clear patient had renal biopsy and prelim shows diffuse acute interstitial nephritis, eosinophils, predominance of lymphocytes, severe arteriosclerosis.  - Nephrology on board and making further recommendations. Pt to get PC removal tomorrow  Active Problems:   Nausea vomiting and abdominal pain: - resolving - Continue antiemetics PRN    Anemia of Renal Disease: on Aranesp    Hypothyroidism: - stable,  Continue with levothyroxine    GERD: - stable, continue PPI    COPD:  - stable, c/w prn bronchodilators.  Hypotension - resolving, continues to have soft blood pressures - 250cc Fluid boluses when necessary    Failure to thrive syndrome:  - PT evaluation     Severe malnutrition in the context of acute illness or injury: - on Nepro feeding supplement  Disposition: Pt would like to go to [place] to live with  daughter.  Antibiotics: None  Anti-infectives    None      DVT Prophylaxis:  prophylatic heparin  Code Status: Full code  Family Communication Son-[name] at bedside  Procedures:  NONE  CONSULTS:  nephrology and vascular surgery  MEDICATIONS: Scheduled Meds: . aspirin EC  81 mg Oral Daily  . darbepoetin (ARANESP) injection - DIALYSIS  150 mcg Intravenous Q Tue-HD  . feeding supplement (NEPRO CARB STEADY)  237 mL Oral BID BM  . heparin subcutaneous  5,000 Units Subcutaneous 3 times per day  . latanoprost  1 drop Both Eyes QHS  . levothyroxine  25 mcg Oral QAC breakfast  . loratadine  10 mg Oral Daily  . mometasone-formoterol  2 puff Inhalation BID  . ondansetron (ZOFRAN) IV  4 mg Intravenous TID AC  . pantoprazole  40 mg Oral Q1200  . pilocarpine  1 drop Right Eye QID  . polyethylene glycol  17 g Oral Daily  . predniSONE  60 mg Oral Q breakfast  . senna-docusate  1 tablet Oral BID  . sodium chloride  3 mL Intravenous Q12H   Continuous Infusions: . sodium chloride 10 mL/hr at 01/30/14 1200  . sodium chloride 10 mL/hr at 02/01/14 1001  . sodium chloride     PRN Meds:.sodium chloride, sodium chloride, sodium chloride, acetaminophen **OR** acetaminophen, acetaminophen, albuterol, feeding supplement (NEPRO CARB STEADY), heparin, heparin, lactulose, lidocaine (PF), lidocaine-prilocaine, ondansetron **OR** ondansetron (ZOFRAN) IV, oxyCODONE-acetaminophen, pentafluoroprop-tetrafluoroeth, sodium chloride    PHYSICAL EXAM: Vital signs in last 24 hours: Filed Vitals:   02/09/14 2048 02/10/14 0523 02/10/14 0955 02/10/14 1007  BP: 106/58 98/49 113/63   Pulse: 80 91 114   Temp: 98.4 F (36.9 C) 98.7 F (37.1  C) 97.5 F (36.4 C)   TempSrc: Oral Oral Oral   Resp: 18     Height:      Weight: 83.502 kg (184 lb 1.4 oz)     SpO2: 98% 98% 99% 96%    Weight change:  Filed Weights   02/05/14 1203 02/05/14 1545 02/09/14 2048  Weight: 83.3 kg (183 lb 10.3 oz) 83.5 kg (184  lb 1.4 oz) 83.502 kg (184 lb 1.4 oz)   Body mass index is 28 kg/(m^2).   Gen Exam: Awake and alert with clear speech.   Neck: Supple, No JVD.   Chest: CTA BL, no wheezes CVS:S1S2 Regular, no murmurs. Abdomen: soft, BS +, non tender, non distended.  Extremities: no edema, lower extremities warm to touch. Neurologic: No facial asymmetry, answers questions appropriately Skin: No Rash.   Wounds: N/A.    Intake/Output from previous day:  Intake/Output Summary (Last 24 hours) at 02/10/14 1350 Last data filed at 02/10/14 0900  Gross per 24 hour  Intake    720 ml  Output   1150 ml  Net   -430 ml     LAB RESULTS: CBC  Recent Labs Lab 02/05/14 0531 02/06/14 0500 02/10/14 0505  WBC 9.3 8.4 13.2*  HGB 8.7* 8.1* 8.6*  HCT 28.8* 26.9* 28.1*  PLT 202 138* 241  MCV 93.2 95.4 96.6  MCH 28.2 28.7 29.6  MCHC 30.2 30.1 30.6  RDW 15.3 15.3 15.9*    Chemistries   Recent Labs Lab 02/06/14 0500 02/07/14 1215 02/08/14 0442 02/09/14 0432 02/10/14 0505  NA 138 139 143 140 140  K 3.8 3.7 3.6 3.8 3.6  CL 101 102 105 103 104  CO2 31 28 28 26 27   GLUCOSE 98 209* 102* 86 101*  BUN 11 27* 38* 38* 44*  CREATININE 3.10* 3.79* 3.68* 3.48* 3.19*  CALCIUM 8.3* 9.3 9.4 9.1 9.4    CBG:  Recent Labs Lab 02/05/14 0725  GLUCAP 118*    GFR Estimated Creatinine Clearance: 16.7 mL/min (by C-G formula based on Cr of 3.19).  Coagulation profile No results for input(s): INR, PROTIME in the last 168 hours.  Cardiac Enzymes No results for input(s): CKMB, TROPONINI, MYOGLOBIN in the last 168 hours.  Invalid input(s): CK  Invalid input(s): POCBNP No results for input(s): DDIMER in the last 72 hours. No results for input(s): HGBA1C in the last 72 hours. No results for input(s): CHOL, HDL, LDLCALC, TRIG, CHOLHDL, LDLDIRECT in the last 72 hours. No results for input(s): TSH, T4TOTAL, T3FREE, THYROIDAB in the last 72 hours.  Invalid input(s): FREET3 No results for input(s): VITAMINB12,  FOLATE, FERRITIN, TIBC, IRON, RETICCTPCT in the last 72 hours. No results for input(s): LIPASE, AMYLASE in the last 72 hours.  Urine Studies No results for input(s): UHGB, CRYS in the last 72 hours.  Invalid input(s): UACOL, UAPR, USPG, UPH, UTP, UGL, UKET, UBIL, UNIT, UROB, ULEU, UEPI, UWBC, URBC, UBAC, CAST, UCOM, BILUA  MICROBIOLOGY: No results found for this or any previous visit (from the past 240 hour(s)).  RADIOLOGY STUDIES/RESULTS: US Abdomen Complete  01/29/2014   CLINICAL DATA:  Abdominal pain, medical renal disease, hypertension  EXAM: ULTRASOUND ABDOMEN COMPLETE  COMPARISON:  Renal ultrasound of January 08, 2014  FINDINGS: Gallbladder: The gallbladder is adequately distended and contains multiple echogenic mobile shadowing stones. There is no gallbladder wall thickening, pericholecystic fluid, or positive sonographic Murphy's sign.  Common bile duct: Diameter: 6.5 mm  Liver: No focal lesion identified. Within normal limits in parenchymal echogenicity.  IVC: No abnormality visualized.  Pancreas: Largely obscured by bowel gas.  Spleen: Size and appearance within normal limits.  Right Kidney: Length: 13.8 cm. Echogenicity of the cortex is mildly increased. No mass or hydronephrosis visualized. There is a midpole cyst measuring 1.2 cm in diameter.  Left Kidney: Length: 12.5 cm. Echogenicity of the cortex is mildly increased. No mass or hydronephrosis visualized.  Abdominal aorta: No aneurysm visualized.  Other findings: None.  IMPRESSION: 1. Gallstones without evidence of acute cholecystitis. 2. Increased renal cortical echotexture consistent with medical renal disease. This is similar to that seen previously.   Electronically Signed   By: David  SwazilandJordan   On: 01/29/2014 13:26   [place] Biopsy-no Radiologist  02/04/2014   : No report.  Please see physicians notes   Electronically Signed   By: Leslye Peerad  Services   On: 02/04/2014 11:47   Dg Chest Port 1 View  01/30/2014   CLINICAL DATA:  Dialysis  catheter insertion.  EXAM: PORTABLE CHEST - 1 VIEW  COMPARISON:  01/29/2014.  FINDINGS: Trachea is midline. Heart size stable. Right IJ dialysis catheter tips project over the SVC. Biapical pleural thickening. Question developing airspace disease in the left lower lobe. Lungs are otherwise clear. No pneumothorax. No definite pleural fluid.  IMPRESSION: 1. Right IJ dialysis catheter insertion without pneumothorax. 2. Question developing left lower lobe airspace disease.   Electronically Signed   By: Leanna BattlesMelinda  Blietz M.D.   On: 01/30/2014 13:48   Dg Chest Port 1 View  01/29/2014   CLINICAL DATA:  New onset of nausea and shortness of Breath  EXAM: PORTABLE CHEST - 1 VIEW  COMPARISON:  01/09/2014  FINDINGS: Cardiomegaly again noted. Atherosclerotic calcifications of thoracic aorta. No acute infiltrate or pulmonary edema. Mild basilar atelectasis.  IMPRESSION: No active disease.  Mild basilar atelectasis.   Electronically Signed   By: Natasha MeadLiviu  Pop M.D.   On: 01/29/2014 08:38   Dg Abd Portable 1v  01/13/2014   CLINICAL DATA:  Generalized abdominal pain, nausea.  EXAM: PORTABLE ABDOMEN - 1 VIEW  COMPARISON:  January 08, 2014.  FINDINGS: The bowel gas pattern is normal. No radio-opaque calculi or other significant radiographic abnormality are seen.  IMPRESSION: No evidence of bowel obstruction or ileus.   Electronically Signed   By: Roque LiasJames  Green M.D.   On: 01/13/2014 18:18   Dg Fluoro Guide Cv Line-no Report  01/30/2014   CLINICAL DATA:    FLOURO GUIDE CV LINE  Fluoroscopy was utilized by the requesting physician.  No radiographic  interpretation.     Penny PiaVEGA, Tempie Gibeault, MD  [facility] Pager:[phone]  If 7PM-7AM, please contact night-coverage [email or web address] Password [email or web address] 02/10/2014, 1:50 PM   LOS: 12 days

## 2014-02-10 NOTE — Progress Notes (Signed)
S: No new CO.  Wants to go home O:BP 113/63 mmHg  Pulse 114  Temp(Src) 97.5 F (36.4 C) (Oral)  Resp 18  Ht 5\' 8"  (1.727 m)  Wt 83.502 kg (184 lb 1.4 oz)  BMI 28.00 kg/m2  SpO2 96%  Intake/Output Summary (Last 24 hours) at 02/10/14 1030 Last data filed at 02/10/14 0900  Gross per 24 hour  Intake    840 ml  Output   1150 ml  Net   -310 ml   Weight change:  XLK:GMWNUGen:awake and alert CVS:RRR Resp:clear Abd:+ BS NTND Ext:no edema NEURO:CNI Ox3 no asterixis Rt IJ permcath   . aspirin EC  81 mg Oral Daily  . darbepoetin (ARANESP) injection - DIALYSIS  150 mcg Intravenous Q Tue-HD  . feeding supplement (NEPRO CARB STEADY)  237 mL Oral BID BM  . heparin subcutaneous  5,000 Units Subcutaneous 3 times per day  . latanoprost  1 drop Both Eyes QHS  . levothyroxine  25 mcg Oral QAC breakfast  . loratadine  10 mg Oral Daily  . mometasone-formoterol  2 puff Inhalation BID  . ondansetron (ZOFRAN) IV  4 mg Intravenous TID AC  . pantoprazole  40 mg Oral Q1200  . pilocarpine  1 drop Right Eye QID  . polyethylene glycol  17 g Oral Daily  . predniSONE  60 mg Oral Q breakfast  . senna-docusate  1 tablet Oral BID  . sodium chloride  3 mL Intravenous Q12H   No results found. BMET    Component Value Date/Time   NA 140 02/10/2014 0505   K 3.6 02/10/2014 0505   CL 104 02/10/2014 0505   CO2 27 02/10/2014 0505   GLUCOSE 101* 02/10/2014 0505   BUN 44* 02/10/2014 0505   CREATININE 3.19* 02/10/2014 0505   CALCIUM 9.4 02/10/2014 0505   GFRNONAA 13* 02/10/2014 0505   GFRAA 15* 02/10/2014 0505   CBC    Component Value Date/Time   WBC 13.2* 02/10/2014 0505   RBC 2.91* 02/10/2014 0505   HGB 8.6* 02/10/2014 0505   HCT 28.1* 02/10/2014 0505   PLT 241 02/10/2014 0505   MCV 96.6 02/10/2014 0505   MCH 29.6 02/10/2014 0505   MCHC 30.6 02/10/2014 0505   RDW 15.9* 02/10/2014 0505   LYMPHSABS 1.7 02/03/2014 0446   MONOABS 1.2* 02/03/2014 0446   EOSABS 0.8* 02/03/2014 0446   BASOSABS 0.2*  02/03/2014 0446     Assessment: 1. AKI sec Acute interstitial nephritis on steroids.  Last HD 12/22.  Scr has remained stable in the 3's since then 2. Anemia, Hg better  Plan: 1. Cont steroids 2.  Her plans are to go to [place] to live with daughter.  I think she should be more stable first and daughter needs to line up PCP and nephrologist first. 3. Plan PC removal tomorrow   Shavon Ashmore T

## 2014-02-11 LAB — RENAL FUNCTION PANEL
Albumin: 3.1 g/dL — ABNORMAL LOW (ref 3.5–5.2)
Anion gap: 11 (ref 5–15)
BUN: 47 mg/dL — ABNORMAL HIGH (ref 6–23)
CO2: 28 mmol/L (ref 19–32)
CREATININE: 2.84 mg/dL — AB (ref 0.50–1.10)
Calcium: 9.8 mg/dL (ref 8.4–10.5)
Chloride: 102 mEq/L (ref 96–112)
GFR calc Af Amer: 17 mL/min — ABNORMAL LOW (ref >90)
GFR calc non Af Amer: 15 mL/min — ABNORMAL LOW (ref >90)
Glucose, Bld: 105 mg/dL — ABNORMAL HIGH (ref 70–99)
Phosphorus: 3.7 mg/dL (ref 2.3–4.6)
Potassium: 3.4 mmol/L — ABNORMAL LOW (ref 3.5–5.1)
Sodium: 141 mmol/L (ref 135–145)

## 2014-02-11 NOTE — Progress Notes (Signed)
Patient ID: [name], female   DOB: 02-28-1934, 79 y.o.   MRN: [id number] S:no new complaintxs O:BP 104/60 mmHg  Pulse 87  Temp(Src) 98 F (36.7 C) (Oral)  Resp 21  Ht 5\' 8"  (1.727 m)  Wt 83.502 kg (184 lb 1.4 oz)  BMI 28.00 kg/m2  SpO2 100%  Intake/Output Summary (Last 24 hours) at 02/11/14 1054 Last data filed at 02/11/14 0550  Gross per 24 hour  Intake    840 ml  Output    350 ml  Net    490 ml   Intake/Output: I/O last 3 completed shifts: In: 1320 [P.O.:1320] Out: 1050 [Urine:1050]  Intake/Output this shift:    Weight change: 0 kg (0 lb) WUJ:WJXBJYNGen:elderly WF in NAD CVS:no rub Resp:occ rhonchi WGN:FAOZHYAbd:benign Ext:no edema   Recent Labs Lab 02/05/14 0531 02/06/14 0500 02/07/14 1215 02/08/14 0442 02/09/14 0432 02/10/14 0505 02/11/14 0835  NA 137 138 139 143 140 140 141  K 3.3* 3.8 3.7 3.6 3.8 3.6 3.4*  CL 104 101 102 105 103 104 102  CO2 24 31 28 28 26 27 28   GLUCOSE 124* 98 209* 102* 86 101* 105*  BUN 39* 11 27* 38* 38* 44* 47*  CREATININE 5.52* 3.10* 3.79* 3.68* 3.48* 3.19* 2.84*  ALBUMIN 2.6* 2.6* 2.7* 2.7* 2.6* 2.5* 3.1*  CALCIUM 9.4 8.3* 9.3 9.4 9.1 9.4 9.8  PHOS 5.8* 2.6 2.8 3.3 3.0 2.9 3.7   Liver Function Tests:  Recent Labs Lab 02/09/14 0432 02/10/14 0505 02/11/14 0835  ALBUMIN 2.6* 2.5* 3.1*   No results for input(s): LIPASE, AMYLASE in the last 168 hours. No results for input(s): AMMONIA in the last 168 hours. CBC:  Recent Labs Lab 02/05/14 0531 02/06/14 0500 02/10/14 0505  WBC 9.3 8.4 13.2*  HGB 8.7* 8.1* 8.6*  HCT 28.8* 26.9* 28.1*  MCV 93.2 95.4 96.6  PLT 202 138* 241   Cardiac Enzymes: No results for input(s): CKTOTAL, CKMB, CKMBINDEX, TROPONINI in the last 168 hours. CBG:  Recent Labs Lab 02/05/14 0725  GLUCAP 118*    Iron Studies: No results for input(s): IRON, TIBC, TRANSFERRIN, FERRITIN in the last 72 hours. Studies/Results: No results found. Marland Kitchen. aspirin EC  81 mg Oral Daily  . darbepoetin (ARANESP) injection -  DIALYSIS  150 mcg Intravenous Q Tue-HD  . feeding supplement (NEPRO CARB STEADY)  237 mL Oral BID BM  . heparin subcutaneous  5,000 Units Subcutaneous 3 times per day  . latanoprost  1 drop Both Eyes QHS  . levothyroxine  25 mcg Oral QAC breakfast  . loratadine  10 mg Oral Daily  . mometasone-formoterol  2 puff Inhalation BID  . ondansetron (ZOFRAN) IV  4 mg Intravenous TID AC  . pantoprazole  40 mg Oral Q1200  . pilocarpine  1 drop Right Eye QID  . polyethylene glycol  17 g Oral Daily  . predniSONE  60 mg Oral Q breakfast  . senna-docusate  1 tablet Oral BID  . sodium chloride  3 mL Intravenous Q12H    BMET    Component Value Date/Time   NA 141 02/11/2014 0835   K 3.4* 02/11/2014 0835   CL 102 02/11/2014 0835   CO2 28 02/11/2014 0835   GLUCOSE 105* 02/11/2014 0835   BUN 47* 02/11/2014 0835   CREATININE 2.84* 02/11/2014 0835   CALCIUM 9.8 02/11/2014 0835   GFRNONAA 15* 02/11/2014 0835   GFRAA 17* 02/11/2014 0835   CBC    Component Value Date/Time   WBC 13.2* 02/10/2014 0505  RBC 2.91* 02/10/2014 0505   HGB 8.6* 02/10/2014 0505   HCT 28.1* 02/10/2014 0505   PLT 241 02/10/2014 0505   MCV 96.6 02/10/2014 0505   MCH 29.6 02/10/2014 0505   MCHC 30.6 02/10/2014 0505   RDW 15.9* 02/10/2014 0505   LYMPHSABS 1.7 02/03/2014 0446   MONOABS 1.2* 02/03/2014 0446   EOSABS 0.8* 02/03/2014 0446   BASOSABS 0.2* 02/03/2014 0446     Assessment/Plan:  1. AKI due to biopsy proven AIN- responding to steroids although offending agent has not been identified 2. Anemia- on aranesp 3. Vascular access- for PC removal 4. Protein malnutrition 5. Deconditioning- cont with PT/OT 6. dispo- eventually wants to move to [place] to be with her daughter but will stay until she improves clinically 7.   Fairy Ashlock A

## 2014-02-11 NOTE — Progress Notes (Signed)
Physical Therapy Treatment Patient Details Name: [name] MRN: [id number] DOB: 11-28-1934 Today's Date: 02/11/2014    History of Present Illness Patient is a 79 yo female admitted 01/29/14 with weakness, AKI, FTT.  PMH:  ESRD on HD, HTN, COPD    PT Comments    Pt progressing slowly towards physical therapy goals. Pt was limited by increased HR with activity - up to 148 bpm with ~40' of walking. Pt continues to fatigue quickly. Son present and states that he would like pt to return home with him prior to moving down to [place] to be with pt's daughter. At this time PT continues to recommend STR at the SNF level to improve independence, and tolerance for functional activity. Will continue to follow.   Follow Up Recommendations  SNF;Supervision/Assistance - 24 hour     Equipment Recommendations  Rolling walker with 5" wheels    Recommendations for Other Services       Precautions / Restrictions Precautions Precautions: Fall Restrictions Weight Bearing Restrictions: No    Mobility  Bed Mobility Overal bed mobility: Needs Assistance Bed Mobility: Supine to Sit     Supine to sit: Supervision     General bed mobility comments: with rail, pt able to get self to EOB with slow mvmts  Transfers Overall transfer level: Needs assistance Equipment used: Rolling walker (2 wheeled) Transfers: Sit to/from Stand Sit to Stand: Min assist         General transfer comment: Pt able to power-up to full standing with steadying assistance. VC's for hand placement on seated surface for safety.  Ambulation/Gait Ambulation/Gait assistance: Min guard Ambulation Distance (Feet): 40 Feet Assistive device: Rolling walker (2 wheeled) Gait Pattern/deviations: Step-through pattern;Decreased stride length;Trunk flexed Gait velocity: Decreased Gait velocity interpretation: Below normal speed for age/gender General Gait Details: Pt ambulated 2 laps from bed to door and back before requiring  a seated rest break. During this time HR had increased to 148bpm, and O2 sats remained >93%.   Stairs            Wheelchair Mobility    Modified Rankin (Stroke Patients Only)       Balance Overall balance assessment: Needs assistance Sitting-balance support: Feet supported;No upper extremity supported Sitting balance-Leahy Scale: Good     Standing balance support: Bilateral upper extremity supported Standing balance-Leahy Scale: Poor Standing balance comment: UE support or assist required.                    Cognition Arousal/Alertness: Awake/alert Behavior During Therapy: Flat affect Overall Cognitive Status: Within Functional Limits for tasks assessed                      Exercises      General Comments        Pertinent Vitals/Pain Pain Assessment: No/denies pain    Home Living                      Prior Function            PT Goals (current goals can now be found in the care plan section) Acute Rehab PT Goals Patient Stated Goal: None stated PT Goal Formulation: With patient/family Time For Goal Achievement: 02/09/14 Potential to Achieve Goals: Fair Progress towards PT goals: Progressing toward goals    Frequency  Min 3X/week    PT Plan Current plan remains appropriate    Co-evaluation  End of Session Equipment Utilized During Treatment: Gait belt;Oxygen Activity Tolerance: Patient limited by fatigue Patient left: in chair;with call bell/phone within reach;with family/visitor present     Time: [id number] PT Time Calculation (min) (ACUTE ONLY): 28 min  Charges:  $Gait Training: 8-22 mins $Therapeutic Activity: 8-22 mins                    G Codes:      [name], [name] 02/11/2014, 1:00 PM   [name] SlipperLaura Shyia Strong, PT, DPT Acute Rehabilitation Services Pager: [phone]

## 2014-02-11 NOTE — Care Management Note (Signed)
CARE MANAGEMENT NOTE 02/11/2014  Patient:  [name],[name]   Account Number:  [id number]  Date Initiated:  02/01/2014  Documentation initiated by:  Xiana Carns  Subjective/Objective Assessment:   CM following for progress and d/c planning.     Action/Plan:   Met with pt family member and IM given current plan is to return to SNF.  02/11/2014 Spoke with pt and son, both agreeable to SNF for rehab post d/c prior to possible move to [place].   Anticipated DC Date:  02/15/2014   Anticipated DC Plan:  SKILLED NURSING FACILITY         Choice offered to / List presented to:             Status of service:  In process, will continue to follow Medicare Important Message given?  YES (If response is "NO", the following Medicare IM given date fields will be blank) Date Medicare IM given:  02/01/2014 Medicare IM given by:  Shandreka Dante Date Additional Medicare IM given:  02/11/2014 Additional Medicare IM given by:  [facility]  Discharge Disposition:    Per UR Regulation:    If discussed at Long Length of Stay Meetings, dates discussed:    Comments:

## 2014-02-11 NOTE — Progress Notes (Signed)
PATIENT DETAILS Name: [name] Age: 79 y.o. Sex: female Date of Birth: 01-21-1935 Admit Date: 01/29/2014 Admitting Physician Benny Lennert, MD PCP:No primary care provider on file.  Brief Narrative From prior PN Patient is a 79 year old female who was recently discharged from this hospital on 01/21/14, she presented with ARF of unclear etiology,she briefly required HD, by discharge was improving, HD catheter was removed, and patient was discharged to SNF.Patient  Returned on 12/15 with worsening nausea, vomiting, diffuse abdominal pain. Labs showed further worsening in creatinine. Renal/VVS consulted, hemodialysis catheter placed and underwent hemodialysis. She also underwent a renal bx-results are currently pending. Etiology of renal failure seems unclear at present, at present not sure whether she will regain renal function, suspect she will be end-stage renal disease soon. Nephrology continues to follow.  Subjective: No major complaints  Assessment/Plan: Principal Problem:  Acute renal failure: Etiology not clear patient had renal biopsy and prelim shows diffuse acute interstitial nephritis, eosinophils, predominance of lymphocytes, severe arteriosclerosis.  - Nephrology on board and making further recommendations. Pt to get PC removal   Active Problems:   Nausea vomiting and abdominal pain: - resolving - Continue antiemetics PRN    Anemia of Renal Disease: on Aranesp    Hypothyroidism: - stable,  Continue with levothyroxine    GERD: - stable, continue PPI    COPD:  - stable, c/w prn bronchodilators.  Hypotension - resolving, continues to have soft blood pressures - 250cc Fluid boluses when necessary    Failure to thrive syndrome:  - PT evaluation     Severe malnutrition in the context of acute illness or injury: - on Nepro feeding supplement  Disposition: Pt would like to go to [facility] to live with daughter.  Antibiotics: None  Anti-infectives      None      DVT Prophylaxis:  prophylatic heparin  Code Status: Full code  Family Communication Son-Keith at bedside  Procedures:  NONE  CONSULTS:  nephrology and vascular surgery  MEDICATIONS: Scheduled Meds: . aspirin EC  81 mg Oral Daily  . darbepoetin (ARANESP) injection - DIALYSIS  150 mcg Intravenous Q Tue-HD  . feeding supplement (NEPRO CARB STEADY)  237 mL Oral BID BM  . heparin subcutaneous  5,000 Units Subcutaneous 3 times per day  . latanoprost  1 drop Both Eyes QHS  . levothyroxine  25 mcg Oral QAC breakfast  . loratadine  10 mg Oral Daily  . mometasone-formoterol  2 puff Inhalation BID  . ondansetron (ZOFRAN) IV  4 mg Intravenous TID AC  . pantoprazole  40 mg Oral Q1200  . pilocarpine  1 drop Right Eye QID  . polyethylene glycol  17 g Oral Daily  . predniSONE  60 mg Oral Q breakfast  . senna-docusate  1 tablet Oral BID  . sodium chloride  3 mL Intravenous Q12H   Continuous Infusions: . sodium chloride 10 mL/hr at 01/30/14 1200  . sodium chloride 10 mL/hr at 02/01/14 1001  . sodium chloride     PRN Meds:.sodium chloride, sodium chloride, sodium chloride, acetaminophen **OR** acetaminophen, acetaminophen, albuterol, feeding supplement (NEPRO CARB STEADY), heparin, heparin, lactulose, lidocaine (PF), lidocaine-prilocaine, ondansetron **OR** ondansetron (ZOFRAN) IV, oxyCODONE-acetaminophen, pentafluoroprop-tetrafluoroeth, sodium chloride    PHYSICAL EXAM: Vital signs in last 24 hours: Filed Vitals:   02/10/14 2049 02/10/14 2053 02/11/14 0532 02/11/14 0951  BP:  100/55 104/60 119/69  Pulse:  72 87 94  Temp:  98 F (36.7 C) 98  F (36.7 C) 97.7 F (36.5 C)  TempSrc:  Oral Oral Oral  Resp:  22 21 20   Height:      Weight:  83.502 kg (184 lb 1.4 oz)    SpO2: 97% 98% 100% 98%    Weight change: 0 kg (0 lb) Filed Weights   02/05/14 1545 02/09/14 2048 02/10/14 2053  Weight: 83.5 kg (184 lb 1.4 oz) 83.502 kg (184 lb 1.4 oz) 83.502 kg (184 lb 1.4 oz)    Body mass index is 28 kg/(m^2).   Gen Exam: Awake and alert with clear speech.   Neck: Supple, No JVD.   Chest: CTA BL, no wheezes CVS:S1S2 Regular, no murmurs. Abdomen: soft, BS +, non tender, non distended.  Extremities: no edema, lower extremities warm to touch. Neurologic: No facial asymmetry, answers questions appropriately Skin: No Rash.   Wounds: N/A.    Intake/Output from previous day:  Intake/Output Summary (Last 24 hours) at 02/11/14 1504 Last data filed at 02/11/14 0550  Gross per 24 hour  Intake    600 ml  Output    350 ml  Net    250 ml     LAB RESULTS: CBC  Recent Labs Lab 02/05/14 0531 02/06/14 0500 02/10/14 0505  WBC 9.3 8.4 13.2*  HGB 8.7* 8.1* 8.6*  HCT 28.8* 26.9* 28.1*  PLT 202 138* 241  MCV 93.2 95.4 96.6  MCH 28.2 28.7 29.6  MCHC 30.2 30.1 30.6  RDW 15.3 15.3 15.9*    Chemistries   Recent Labs Lab 02/07/14 1215 02/08/14 0442 02/09/14 0432 02/10/14 0505 02/11/14 0835  NA 139 143 140 140 141  K 3.7 3.6 3.8 3.6 3.4*  CL 102 105 103 104 102  CO2 28 28 26 27 28   GLUCOSE 209* 102* 86 101* 105*  BUN 27* 38* 38* 44* 47*  CREATININE 3.79* 3.68* 3.48* 3.19* 2.84*  CALCIUM 9.3 9.4 9.1 9.4 9.8    CBG:  Recent Labs Lab 02/05/14 0725  GLUCAP 118*    GFR Estimated Creatinine Clearance: 18.8 mL/min (by C-G formula based on Cr of 2.84).  Coagulation profile No results for input(s): INR, PROTIME in the last 168 hours.  Cardiac Enzymes No results for input(s): CKMB, TROPONINI, MYOGLOBIN in the last 168 hours.  Invalid input(s): CK  Invalid input(s): POCBNP No results for input(s): DDIMER in the last 72 hours. No results for input(s): HGBA1C in the last 72 hours. No results for input(s): CHOL, HDL, LDLCALC, TRIG, CHOLHDL, LDLDIRECT in the last 72 hours. No results for input(s): TSH, T4TOTAL, T3FREE, THYROIDAB in the last 72 hours.  Invalid input(s): FREET3 No results for input(s): VITAMINB12, FOLATE, FERRITIN, TIBC, IRON,  RETICCTPCT in the last 72 hours. No results for input(s): LIPASE, AMYLASE in the last 72 hours.  Urine Studies No results for input(s): UHGB, CRYS in the last 72 hours.  Invalid input(s): UACOL, UAPR, USPG, UPH, UTP, UGL, UKET, UBIL, UNIT, UROB, ULEU, UEPI, UWBC, URBC, UBAC, CAST, UCOM, BILUA  MICROBIOLOGY: No results found for this or any previous visit (from the past 240 hour(s)).  RADIOLOGY STUDIES/RESULTS: [place] Abdomen Complete  01/29/2014   CLINICAL DATA:  Abdominal pain, medical renal disease, hypertension  EXAM: ULTRASOUND ABDOMEN COMPLETE  COMPARISON:  Renal ultrasound of January 08, 2014  FINDINGS: Gallbladder: The gallbladder is adequately distended and contains multiple echogenic mobile shadowing stones. There is no gallbladder wall thickening, pericholecystic fluid, or positive sonographic Murphy's sign.  Common bile duct: Diameter: 6.5 mm  Liver: No focal lesion identified. Within  normal limits in parenchymal echogenicity.  IVC: No abnormality visualized.  Pancreas: Largely obscured by bowel gas.  Spleen: Size and appearance within normal limits.  Right Kidney: Length: 13.8 cm. Echogenicity of the cortex is mildly increased. No mass or hydronephrosis visualized. There is a midpole cyst measuring 1.2 cm in diameter.  Left Kidney: Length: 12.5 cm. Echogenicity of the cortex is mildly increased. No mass or hydronephrosis visualized.  Abdominal aorta: No aneurysm visualized.  Other findings: None.  IMPRESSION: 1. Gallstones without evidence of acute cholecystitis. 2. Increased renal cortical echotexture consistent with medical renal disease. This is similar to that seen previously.   Electronically Signed   By: David  SwazilandJordan   On: 01/29/2014 13:26   [place] Biopsy-no Radiologist  02/04/2014   : No report.  Please see physicians notes   Electronically Signed   By: Leslye Peerad  Services   On: 02/04/2014 11:47   Dg Chest Port 1 View  01/30/2014   CLINICAL DATA:  Dialysis catheter insertion.  EXAM:  PORTABLE CHEST - 1 VIEW  COMPARISON:  01/29/2014.  FINDINGS: Trachea is midline. Heart size stable. Right IJ dialysis catheter tips project over the SVC. Biapical pleural thickening. Question developing airspace disease in the left lower lobe. Lungs are otherwise clear. No pneumothorax. No definite pleural fluid.  IMPRESSION: 1. Right IJ dialysis catheter insertion without pneumothorax. 2. Question developing left lower lobe airspace disease.   Electronically Signed   By: Leanna BattlesMelinda  Blietz M.D.   On: 01/30/2014 13:48   Dg Chest Port 1 View  01/29/2014   CLINICAL DATA:  New onset of nausea and shortness of Breath  EXAM: PORTABLE CHEST - 1 VIEW  COMPARISON:  01/09/2014  FINDINGS: Cardiomegaly again noted. Atherosclerotic calcifications of thoracic aorta. No acute infiltrate or pulmonary edema. Mild basilar atelectasis.  IMPRESSION: No active disease.  Mild basilar atelectasis.   Electronically Signed   By: Natasha MeadLiviu  Pop M.D.   On: 01/29/2014 08:38   Dg Abd Portable 1v  01/13/2014   CLINICAL DATA:  Generalized abdominal pain, nausea.  EXAM: PORTABLE ABDOMEN - 1 VIEW  COMPARISON:  January 08, 2014.  FINDINGS: The bowel gas pattern is normal. No radio-opaque calculi or other significant radiographic abnormality are seen.  IMPRESSION: No evidence of bowel obstruction or ileus.   Electronically Signed   By: Roque LiasJames  Green M.D.   On: 01/13/2014 18:18   Dg Fluoro Guide Cv Line-no Report  01/30/2014   CLINICAL DATA:    FLOURO GUIDE CV LINE  Fluoroscopy was utilized by the requesting physician.  No radiographic  interpretation.     Penny PiaVEGA, Tierre Gerard, MD  [facility] Pager:[phone]  If 7PM-7AM, please contact night-coverage [email or web address] Password [email or web address] 02/11/2014, 3:04 PM   LOS: 13 days

## 2014-02-12 LAB — RENAL FUNCTION PANEL
Albumin: 2.7 g/dL — ABNORMAL LOW (ref 3.5–5.2)
Anion gap: 11 (ref 5–15)
BUN: 50 mg/dL — ABNORMAL HIGH (ref 6–23)
CO2: 23 mmol/L (ref 19–32)
Calcium: 9.3 mg/dL (ref 8.4–10.5)
Chloride: 105 mEq/L (ref 96–112)
Creatinine, Ser: 2.7 mg/dL — ABNORMAL HIGH (ref 0.50–1.10)
GFR calc Af Amer: 18 mL/min — ABNORMAL LOW (ref >90)
GFR calc non Af Amer: 16 mL/min — ABNORMAL LOW (ref >90)
Glucose, Bld: 81 mg/dL (ref 70–99)
POTASSIUM: 3.9 mmol/L (ref 3.5–5.1)
Phosphorus: 4 mg/dL (ref 2.3–4.6)
Sodium: 139 mmol/L (ref 135–145)

## 2014-02-12 NOTE — Progress Notes (Signed)
NUTRITION FOLLOW UP  Pt meets criteria for SEVERE MALNUTRITION in the context of acute illness/injury as evidenced by a 12% weight loss in 2 weeks and energy intake of </= 50% for >/= 5 days.  DOCUMENTATION CODES Per approved criteria  -Severe malnutrition in the context of acute illness or injury   INTERVENTION: Continue providing butter pecan Nepro Shake po BID, each supplement provides 425 kcal and 19 grams protein.  Encourage adequate PO intake.  NUTRITION DIAGNOSIS: Increased nutrient needs related to AKI as evidenced by estimated nutrition needs; ongoing  Goal: Pt to meet >/= 90% of their estimated nutrition needs; met  Monitor:  PO intake, weight trends, labs, I/O's  79 y.o. female  Admitting Dx: AKI (acute kidney injury)  ASSESSMENT: Pt presenting as a transfer from her nursing home, At her facility she has had ongoing generalized weakness, fatigue, poor tolerance to physical exertion. Her son who is present at bedside reports that she "picks at her food." She also complains of intermittent abdominal pain, had episode of nausea and vomiting.  Procedure: (12/16): #1 bilateral ultrasound localization internal jugular veins #2 insertion of tunneled hemodialysis catheter via right IJ-diateck -19 cm  Pt s/p PC removal. Last HD 12/22. Meal completion has been 75-100%. Pt was been drinking her Nepro shakes. Will continue with current orders. Pt was encouraged to eat her food at meals and to drink her supplements.   Height: Ht Readings from Last 1 Encounters:  01/29/14 5' 8"  (1.727 m)    Weight: Wt Readings from Last 1 Encounters:  02/11/14 183 lb 3.2 oz (83.1 kg)  12/01  210 lbs  BMI:  Body mass index is 27.86 kg/(m^2).  Re-Estimated Nutritional Needs: Kcal: 1850-2100 Protein: 95-110 grams Fluid: 1.2 L/day  Skin: +1 LUE edema, non-pitting LE edema, incision on right neck and lower back  Diet Order: Diet renal W/1278m fluid restriction   Intake/Output Summary  (Last 24 hours) at 02/12/14 1334 Last data filed at 02/12/14 0958  Gross per 24 hour  Intake    480 ml  Output    200 ml  Net    280 ml    Last BM: 12/28  Labs:   Recent Labs Lab 02/10/14 0505 02/11/14 0835 02/12/14 0605  NA 140 141 139  K 3.6 3.4* 3.9  CL 104 102 105  CO2 27 28 23   BUN 44* 47* 50*  CREATININE 3.19* 2.84* 2.70*  CALCIUM 9.4 9.8 9.3  PHOS 2.9 3.7 4.0  GLUCOSE 101* 105* 81    CBG (last 3)  No results for input(s): GLUCAP in the last 72 hours.  Scheduled Meds: . aspirin EC  81 mg Oral Daily  . darbepoetin (ARANESP) injection - DIALYSIS  150 mcg Intravenous Q Tue-HD  . feeding supplement (NEPRO CARB STEADY)  237 mL Oral BID BM  . heparin subcutaneous  5,000 Units Subcutaneous 3 times per day  . latanoprost  1 drop Both Eyes QHS  . levothyroxine  25 mcg Oral QAC breakfast  . loratadine  10 mg Oral Daily  . mometasone-formoterol  2 puff Inhalation BID  . ondansetron (ZOFRAN) IV  4 mg Intravenous TID AC  . pantoprazole  40 mg Oral Q1200  . pilocarpine  1 drop Right Eye QID  . polyethylene glycol  17 g Oral Daily  . predniSONE  60 mg Oral Q breakfast  . senna-docusate  1 tablet Oral BID  . sodium chloride  3 mL Intravenous Q12H    Continuous Infusions: . sodium chloride  10 mL/hr at 01/30/14 1200  . sodium chloride 10 mL/hr at 02/01/14 1001  . sodium chloride      Past Medical History  Diagnosis Date  . Hypertension   . Hypothyroidism   . Seasonal allergies   . Glaucoma   . COPD (chronic obstructive pulmonary disease)   . Former smoker   . Renal disorder     Past Surgical History  Procedure Laterality Date  . Insertion of dialysis catheter Right 01/30/2014    Procedure: INSERTION OF DIALYSIS CATHETER RIGHT INTERNAL JUGULAR;  Surgeon: Mal Misty, MD;  Location: [facility];  Service: Vascular;  Laterality: Right;    Kallie Locks, MS, RD, LDN Pager # [phone] After hours/ weekend pager # [phone]

## 2014-02-12 NOTE — Progress Notes (Signed)
Patient ID: [name], female   DOB: 09-03-1934, 79 y.o.   MRN: [id number] S:feels well, no complaints O:BP 114/53 mmHg  Pulse 95  Temp(Src) 98 F (36.7 C) (Oral)  Resp 18  Ht 5\' 8"  (1.727 m)  Wt 83.1 kg (183 lb 3.2 oz)  BMI 27.86 kg/m2  SpO2 100%  Intake/Output Summary (Last 24 hours) at 02/12/14 0953 Last data filed at 02/11/14 1828  Gross per 24 hour  Intake    240 ml  Output    200 ml  Net     40 ml   Intake/Output: I/O last 3 completed shifts: In: 480 [P.O.:480] Out: 550 [Urine:550]  Intake/Output this shift:    Weight change: -0.401 kg (-14.2 oz) WUJ:WJXBJYNGen:elderly WF in [place] CVS:no rub Resp:cta WGN:FAOZHYAbd:benign Ext:no edema   Recent Labs Lab 02/06/14 0500 02/07/14 1215 02/08/14 0442 02/09/14 0432 02/10/14 0505 02/11/14 0835 02/12/14 0605  NA 138 139 143 140 140 141 139  K 3.8 3.7 3.6 3.8 3.6 3.4* 3.9  CL 101 102 105 103 104 102 105  CO2 31 28 28 26 27 28 23   GLUCOSE 98 209* 102* 86 101* 105* 81  BUN 11 27* 38* 38* 44* 47* 50*  CREATININE 3.10* 3.79* 3.68* 3.48* 3.19* 2.84* 2.70*  ALBUMIN 2.6* 2.7* 2.7* 2.6* 2.5* 3.1* 2.7*  CALCIUM 8.3* 9.3 9.4 9.1 9.4 9.8 9.3  PHOS 2.6 2.8 3.3 3.0 2.9 3.7 4.0   Liver Function Tests:  Recent Labs Lab 02/10/14 0505 02/11/14 0835 02/12/14 0605  ALBUMIN 2.5* 3.1* 2.7*   No results for input(s): LIPASE, AMYLASE in the last 168 hours. No results for input(s): AMMONIA in the last 168 hours. CBC:  Recent Labs Lab 02/06/14 0500 02/10/14 0505  WBC 8.4 13.2*  HGB 8.1* 8.6*  HCT 26.9* 28.1*  MCV 95.4 96.6  PLT 138* 241   Cardiac Enzymes: No results for input(s): CKTOTAL, CKMB, CKMBINDEX, TROPONINI in the last 168 hours. CBG: No results for input(s): GLUCAP in the last 168 hours.  Iron Studies: No results for input(s): IRON, TIBC, TRANSFERRIN, FERRITIN in the last 72 hours. Studies/Results: No results found. Marland Kitchen. aspirin EC  81 mg Oral Daily  . darbepoetin (ARANESP) injection - DIALYSIS  150 mcg Intravenous Q Tue-HD  .  feeding supplement (NEPRO CARB STEADY)  237 mL Oral BID BM  . heparin subcutaneous  5,000 Units Subcutaneous 3 times per day  . latanoprost  1 drop Both Eyes QHS  . levothyroxine  25 mcg Oral QAC breakfast  . loratadine  10 mg Oral Daily  . mometasone-formoterol  2 puff Inhalation BID  . ondansetron (ZOFRAN) IV  4 mg Intravenous TID AC  . pantoprazole  40 mg Oral Q1200  . pilocarpine  1 drop Right Eye QID  . polyethylene glycol  17 g Oral Daily  . predniSONE  60 mg Oral Q breakfast  . senna-docusate  1 tablet Oral BID  . sodium chloride  3 mL Intravenous Q12H    BMET    Component Value Date/Time   NA 139 02/12/2014 0605   K 3.9 02/12/2014 0605   CL 105 02/12/2014 0605   CO2 23 02/12/2014 0605   GLUCOSE 81 02/12/2014 0605   BUN 50* 02/12/2014 0605   CREATININE 2.70* 02/12/2014 0605   CALCIUM 9.3 02/12/2014 0605   GFRNONAA 16* 02/12/2014 0605   GFRAA 18* 02/12/2014 0605   CBC    Component Value Date/Time   WBC 13.2* 02/10/2014 0505   RBC 2.91* 02/10/2014 0505  HGB 8.6* 02/10/2014 0505   HCT 28.1* 02/10/2014 0505   PLT 241 02/10/2014 0505   MCV 96.6 02/10/2014 0505   MCH 29.6 02/10/2014 0505   MCHC 30.6 02/10/2014 0505   RDW 15.9* 02/10/2014 0505   LYMPHSABS 1.7 02/03/2014 0446   MONOABS 1.2* 02/03/2014 0446   EOSABS 0.8* 02/03/2014 0446   BASOSABS 0.2* 02/03/2014 0446     Assessment/Plan:  1. AKI due to biopsy proven Acute Interstitial Nephritis- responding to steroids although offending agent has not been identified 1. On prednisone 60mg  daily and will need to start tapering slowly over a 2-3 month period once her renal function returns closer to her baseline 2. Follow up with Dr. Lowell GuitarPowell as an outpt until she can [place]ph[place] in [place] 2. Anemia- on aranesp 3. Vascular access- for PC removal 4. Protein malnutrition 5. Deconditioning- cont with PT/OT 6. dispo- eventually [place] to move to [place] to be with her daughter but will stay until she  improves clinically  Adan Beal A

## 2014-02-12 NOTE — Progress Notes (Signed)
PATIENT DETAILS Name: [name] Age: 79 y.o. Sex: female Date of Birth: [birth year] Admit Date: 01/29/2014 Admitting Physician Benny LennertJoseph L Zammit, MD PCP:No primary care provider on file.  Brief Narrative From prior PN Patient is a 79 year old female who was recently discharged from this hospital on 01/21/14, she presented with ARF of unclear etiology,she briefly required HD, by discharge was improving, HD catheter was removed, and patient was discharged to SNF.Patient  Returned on 12/15 with worsening nausea, vomiting, diffuse abdominal pain. Labs showed further worsening in creatinine. Renal/VVS consulted, hemodialysis catheter placed and underwent hemodialysis. She also underwent a renal bx-results are currently pending. Etiology of renal failure seems to be from acute interstitial nephritis. Nephrology continues to follow.  Subjective: No major complaints  Assessment/Plan: Principal Problem:  Acute renal failure: Etiology not clear patient had renal biopsy and prelim shows diffuse acute interstitial nephritis, eosinophils, predominance of lymphocytes, severe arteriosclerosis.  - Nephrology on board and making further recommendations. S/p PC removal  - PT while in house  Active Problems:   Nausea vomiting and abdominal pain: - resolving - Continue antiemetics PRN    Anemia of Renal Disease: on Aranesp    Hypothyroidism: - stable,  Continue with levothyroxine    GERD: - stable, continue PPI    COPD:  - stable, c/w prn bronchodilators.  Hypotension - resolving, continues to have soft blood pressures - 250cc Fluid boluses when necessary    Failure to thrive syndrome:  - PT evaluation     Severe malnutrition in the context of acute illness or injury: - on Nepro feeding supplement  Disposition: Pt would like to go to [place] to live with daughter once ready for discharge. Once cleared by Nephrology for discharge.  Antibiotics: None  Anti-infectives    None      DVT Prophylaxis:  prophylatic heparin  Code Status: Full code  Family Communication Son-Keith at bedside   CONSULTS:  nephrology and vascular surgery  MEDICATIONS: Scheduled Meds: . aspirin EC  81 mg Oral Daily  . darbepoetin (ARANESP) injection - DIALYSIS  150 mcg Intravenous Q Tue-HD  . feeding supplement (NEPRO CARB STEADY)  237 mL Oral BID BM  . heparin subcutaneous  5,000 Units Subcutaneous 3 times per day  . latanoprost  1 drop Both Eyes QHS  . levothyroxine  25 mcg Oral QAC breakfast  . loratadine  10 mg Oral Daily  . mometasone-formoterol  2 puff Inhalation BID  . ondansetron (ZOFRAN) IV  4 mg Intravenous TID AC  . pantoprazole  40 mg Oral Q1200  . pilocarpine  1 drop Right Eye QID  . polyethylene glycol  17 g Oral Daily  . predniSONE  60 mg Oral Q breakfast  . senna-docusate  1 tablet Oral BID  . sodium chloride  3 mL Intravenous Q12H   Continuous Infusions: . sodium chloride 10 mL/hr at 01/30/14 1200  . sodium chloride 10 mL/hr at 02/01/14 1001  . sodium chloride     PRN Meds:.sodium chloride, sodium chloride, sodium chloride, acetaminophen **OR** acetaminophen, acetaminophen, albuterol, feeding supplement (NEPRO CARB STEADY), heparin, heparin, lactulose, lidocaine (PF), lidocaine-prilocaine, ondansetron **OR** ondansetron (ZOFRAN) IV, oxyCODONE-acetaminophen, pentafluoroprop-tetrafluoroeth, sodium chloride    PHYSICAL EXAM: Vital signs in last 24 hours: Filed Vitals:   02/11/14 2100 02/12/14 0500 02/12/14 0915 02/12/14 0957  BP: 96/46 114/53  105/46  Pulse: 91 95  106  Temp: 98.1 F (36.7 C) 98 F (36.7 C)  97.7 F (36.5  C)  TempSrc: Oral Oral  Oral  Resp: 18 18  18   Height:      Weight: 83.1 kg (183 lb 3.2 oz)     SpO2: 97% 100% 100% 100%    Weight change: -0.401 kg (-14.2 oz) Filed Weights   02/09/14 2048 02/10/14 2053 02/11/14 2100  Weight: 83.502 kg (184 lb 1.4 oz) 83.502 kg (184 lb 1.4 oz) 83.1 kg (183 lb 3.2 oz)   Body mass  index is 27.86 kg/(m^2).   Gen Exam: Awake and alert with clear speech.   Neck: Supple, No JVD.   Chest: CTA BL, no wheezes CVS:S1S2 Regular, no murmurs. Abdomen: soft, BS +, non tender, non distended.  Extremities: no edema, lower extremities warm to touch. Neurologic: No facial asymmetry, answers questions appropriately Skin: No Rash.   Wounds: N/A.    Intake/Output from previous day:  Intake/Output Summary (Last 24 hours) at 02/12/14 1308 Last data filed at 02/12/14 0958  Gross per 24 hour  Intake    480 ml  Output    200 ml  Net    280 ml     LAB RESULTS: CBC  Recent Labs Lab 02/06/14 0500 02/10/14 0505  WBC 8.4 13.2*  HGB 8.1* 8.6*  HCT 26.9* 28.1*  PLT 138* 241  MCV 95.4 96.6  MCH 28.7 29.6  MCHC 30.1 30.6  RDW 15.3 15.9*    Chemistries   Recent Labs Lab 02/08/14 0442 02/09/14 0432 02/10/14 0505 02/11/14 0835 02/12/14 0605  NA 143 140 140 141 139  K 3.6 3.8 3.6 3.4* 3.9  CL 105 103 104 102 105  CO2 28 26 27 28 23   GLUCOSE 102* 86 101* 105* 81  BUN 38* 38* 44* 47* 50*  CREATININE 3.68* 3.48* 3.19* 2.84* 2.70*  CALCIUM 9.4 9.1 9.4 9.8 9.3    CBG: No results for input(s): GLUCAP in the last 168 hours.  GFR Estimated Creatinine Clearance: 19.7 mL/min (by C-G formula based on Cr of 2.7).  Coagulation profile No results for input(s): INR, PROTIME in the last 168 hours.  Cardiac Enzymes No results for input(s): CKMB, TROPONINI, MYOGLOBIN in the last 168 hours.  Invalid input(s): CK  Invalid input(s): POCBNP No results for input(s): DDIMER in the last 72 hours. No results for input(s): HGBA1C in the last 72 hours. No results for input(s): CHOL, HDL, LDLCALC, TRIG, CHOLHDL, LDLDIRECT in the last 72 hours. No results for input(s): TSH, T4TOTAL, T3FREE, THYROIDAB in the last 72 hours.  Invalid input(s): FREET3 No results for input(s): VITAMINB12, FOLATE, FERRITIN, TIBC, IRON, RETICCTPCT in the last 72 hours. No results for input(s): LIPASE,  AMYLASE in the last 72 hours.  Urine Studies No results for input(s): UHGB, CRYS in the last 72 hours.  Invalid input(s): UACOL, UAPR, USPG, UPH, UTP, UGL, UKET, UBIL, UNIT, UROB, ULEU, UEPI, UWBC, URBC, UBAC, CAST, UCOM, BILUA  MICROBIOLOGY: No results found for this or any previous visit (from the past 240 hour(s)).  RADIOLOGY STUDIES/RESULTS: US Abdomen Complete  01/29/2014   CLINICAL DATA:  Abdominal pain, medical renal disease, hypertension  EXAM: ULTRASOUND ABDOMEN COMPLETE  COMPARISON:  Renal ultrasound of January 08, 2014  FINDINGS: Gallbladder: The gallbladder is adequately distended and contains multiple echogenic mobile shadowing stones. There is no gallbladder wall thickening, pericholecystic fluid, or positive sonographic Murphy's sign.  Common bile duct: Diameter: 6.5 mm  Liver: No focal lesion identified. Within normal limits in parenchymal echogenicity.  IVC: No abnormality visualized.  Pancreas: Largely obscured by bowel gas.  Spleen: Size and appearance within normal limits.  Right Kidney: Length: 13.8 cm. Echogenicity of the cortex is mildly increased. No mass or hydronephrosis visualized. There is a midpole cyst measuring 1.2 cm in diameter.  Left Kidney: Length: 12.5 cm. Echogenicity of the cortex is mildly increased. No mass or hydronephrosis visualized.  Abdominal aorta: No aneurysm visualized.  Other findings: None.  IMPRESSION: 1. Gallstones without evidence of acute cholecystitis. 2. Increased renal cortical echotexture consistent with medical renal disease. This is similar to that seen previously.   Electronically Signed   By: David  SwazilandJordan   On: 01/29/2014 13:26   [place] Biopsy-no Radiologist  02/04/2014   : No report.  Please see physicians notes   Electronically Signed   By: Leslye Peerad  Services   On: 02/04/2014 11:47   Dg Chest Port 1 View  01/30/2014   CLINICAL DATA:  Dialysis catheter insertion.  EXAM: PORTABLE CHEST - 1 VIEW  COMPARISON:  01/29/2014.  FINDINGS: Trachea is  midline. Heart size stable. Right IJ dialysis catheter tips project over the SVC. Biapical pleural thickening. Question developing airspace disease in the left lower lobe. Lungs are otherwise clear. No pneumothorax. No definite pleural fluid.  IMPRESSION: 1. Right IJ dialysis catheter insertion without pneumothorax. 2. Question developing left lower lobe airspace disease.   Electronically Signed   By: Leanna BattlesMelinda  Blietz M.D.   On: 01/30/2014 13:48   Dg Chest Port 1 View  01/29/2014   CLINICAL DATA:  New onset of nausea and shortness of Breath  EXAM: PORTABLE CHEST - 1 VIEW  COMPARISON:  01/09/2014  FINDINGS: Cardiomegaly again noted. Atherosclerotic calcifications of thoracic aorta. No acute infiltrate or pulmonary edema. Mild basilar atelectasis.  IMPRESSION: No active disease.  Mild basilar atelectasis.   Electronically Signed   By: Natasha MeadLiviu  Pop M.D.   On: 01/29/2014 08:38   Dg Abd Portable 1v  01/13/2014   CLINICAL DATA:  Generalized abdominal pain, nausea.  EXAM: PORTABLE ABDOMEN - 1 VIEW  COMPARISON:  January 08, 2014.  FINDINGS: The bowel gas pattern is normal. No radio-opaque calculi or other significant radiographic abnormality are seen.  IMPRESSION: No evidence of bowel obstruction or ileus.   Electronically Signed   By: Roque LiasJames  Green M.D.   On: 01/13/2014 18:18   Dg Fluoro Guide Cv Line-no Report  01/30/2014   CLINICAL DATA:    FLOURO GUIDE CV LINE  Fluoroscopy was utilized by the requesting physician.  No radiographic  interpretation.     Penny PiaVEGA, Romelo Sciandra, MD  [facility] Pager:[phone]  If 7PM-7AM, please contact night-coverage [email or web address] Password [email or web address] 02/12/2014, 1:08 PM   LOS: 14 days

## 2014-02-13 ENCOUNTER — Encounter (HOSPITAL_COMMUNITY): Payer: Self-pay

## 2014-02-13 DIAGNOSIS — I959 Hypotension, unspecified: Secondary | ICD-10-CM | POA: Insufficient documentation

## 2014-02-13 DIAGNOSIS — N186 End stage renal disease: Secondary | ICD-10-CM

## 2014-02-13 LAB — RENAL FUNCTION PANEL
Albumin: 2.7 g/dL — ABNORMAL LOW (ref 3.5–5.2)
Anion gap: 5 (ref 5–15)
BUN: 49 mg/dL — ABNORMAL HIGH (ref 6–23)
CHLORIDE: 106 meq/L (ref 96–112)
CO2: 29 mmol/L (ref 19–32)
Calcium: 8.8 mg/dL (ref 8.4–10.5)
Creatinine, Ser: 2.57 mg/dL — ABNORMAL HIGH (ref 0.50–1.10)
GFR calc non Af Amer: 17 mL/min — ABNORMAL LOW (ref >90)
GFR, EST AFRICAN AMERICAN: 20 mL/min — AB (ref >90)
Glucose, Bld: 84 mg/dL (ref 70–99)
Phosphorus: 4.4 mg/dL (ref 2.3–4.6)
Potassium: 4 mmol/L (ref 3.5–5.1)
Sodium: 140 mmol/L (ref 135–145)

## 2014-02-13 MED ORDER — DARBEPOETIN ALFA 150 MCG/0.3ML IJ SOSY
150.0000 ug | PREFILLED_SYRINGE | INTRAMUSCULAR | Status: DC
  Administered 2014-02-13: 150 ug via SUBCUTANEOUS
  Filled 2014-02-13: qty 0.3

## 2014-02-13 MED ORDER — LACTULOSE 10 GM/15ML PO SOLN: 30.0000 g | Freq: Two times a day (BID) | ORAL | Status: DC | PRN

## 2014-02-13 MED ORDER — NEPRO/CARBSTEADY PO LIQD: 237.0000 mL | Freq: Two times a day (BID) | ORAL | Status: AC

## 2014-02-13 MED ORDER — PREDNISONE 20 MG PO TABS: 60.0000 mg | ORAL_TABLET | Freq: Every day | ORAL | Status: DC

## 2014-02-13 MED ORDER — POLYETHYLENE GLYCOL 3350 17 G PO PACK: 17.0000 g | PACK | Freq: Every day | ORAL | Status: AC

## 2014-02-13 MED ORDER — ACETAMINOPHEN 325 MG PO TABS: 650.0000 mg | ORAL_TABLET | Freq: Four times a day (QID) | ORAL | Status: AC | PRN

## 2014-02-13 MED ORDER — PREDNISONE 20 MG PO TABS: 60.0000 mg | ORAL_TABLET | Freq: Every day | ORAL | Status: AC

## 2014-02-13 NOTE — Clinical Social Work Note (Signed)
CSW was advised by MD that patient will be d/c'ed to home and not to SNF as planned- family at bedside spoke with MD and will provide 24 hour care- [facility]- CSW will sign off- Reece LevyJanet Williamson Cavanah, MSW, [employer]LCSWA [phone]

## 2014-02-13 NOTE — Progress Notes (Signed)
H&P    CC:  Catheter removal   HPI:  This is a 79 y.o. female here for diatek catheter removal.  Right diatek catheter.   Past Medical History  Diagnosis Date  . Hypertension   . Hypothyroidism   . Seasonal allergies   . Glaucoma   . COPD (chronic obstructive pulmonary disease)   . Former smoker   . Renal disorder     FH:  Non-Contributory  History   Social History  . Marital Status: Widowed    Spouse Name: N/A    Number of Children: N/A  . Years of Education: N/A   Occupational History  . Not on file.   Social History Main Topics  . Smoking status: Former Games developer  . Smokeless tobacco: Not on file  . Alcohol Use: Not on file  . Drug Use: Not on file  . Sexual Activity: Not Currently   Other Topics Concern  . Not on file   Social History Narrative   Worked in [employer]    Allergies  Allergen Reactions  . Ciprofloxacin Nausea And Vomiting    Current Facility-Administered Medications  Medication Dose Route Frequency Provider Last Rate Last Dose  . 0.9 %  sodium chloride infusion  250 mL Intravenous PRN Trevor Iha, MD      . 0.9 %  sodium chloride infusion   Intravenous Continuous Laverle Hobby, MD 10 mL/hr at 01/30/14 1200    . 0.9 %  sodium chloride infusion   Intravenous Continuous Trevor Iha, MD 10 mL/hr at 02/01/14 1001    . 0.9 %  sodium chloride infusion   Intravenous Continuous Llana Aliment Deterding, MD      . 0.9 %  sodium chloride infusion  100 mL Intravenous PRN Lauris Poag, MD      . 0.9 %  sodium chloride infusion  100 mL Intravenous PRN Lauris Poag, MD      . acetaminophen (TYLENOL) tablet 650 mg  650 mg Oral Q6H PRN Jeralyn Bennett, MD   650 mg at 01/30/14 2253   Or  . acetaminophen (TYLENOL) suppository 650 mg  650 mg Rectal Q6H PRN Jeralyn Bennett, MD      . acetaminophen (TYLENOL) tablet 500-1,000 mg  500-1,000 mg Oral Q6H PRN Trevor Iha, MD      . albuterol (PROVENTIL) (2.5 MG/3ML) 0.083% nebulizer solution 2.5 mg  2.5  mg Nebulization Q4H PRN Jeralyn Bennett, MD      . aspirin EC tablet 81 mg  81 mg Oral Daily Jeralyn Bennett, MD   81 mg at 02/12/14 1329  . Darbepoetin Alfa (ARANESP) injection 150 mcg  150 mcg Subcutaneous Q Wed-1800 Terrial Rhodes, MD      . feeding supplement (NEPRO CARB STEADY) liquid 237 mL  237 mL Oral BID BM Marijean Niemann, RD   237 mL at 02/12/14 1328  . feeding supplement (NEPRO CARB STEADY) liquid 237 mL  237 mL Oral PRN Lauris Poag, MD      . heparin injection 1,000 Units  1,000 Units Dialysis PRN Lauris Poag, MD      . heparin injection 1,700 Units  20 Units/kg Dialysis PRN Lauris Poag, MD   1,700 Units at 02/05/14 1219  . heparin injection 5,000 Units  5,000 Units Subcutaneous 3 times per day Maretta Bees, MD   5,000 Units at 02/13/14 [phone]  . lactulose (CHRONULAC) 10 GM/15ML solution 30 g  30 g Oral BID PRN Maretta Bees, MD  30 g at 02/04/14 1023  . latanoprost (XALATAN) 0.005 % ophthalmic solution 1 drop  1 drop Both Eyes QHS Jeralyn BennettEzequiel Zamora, MD   1 drop at 02/12/14 2227  . levothyroxine (SYNTHROID, LEVOTHROID) tablet 25 mcg  25 mcg Oral QAC breakfast Jeralyn BennettEzequiel Zamora, MD   25 mcg at 02/13/14 [phone]  . lidocaine (PF) (XYLOCAINE) 1 % injection 5 mL  5 mL Intradermal PRN Lauris PoagAlvin C Powell, MD      . lidocaine-prilocaine (EMLA) cream 1 application  1 application Topical PRN Lauris PoagAlvin C Powell, MD      . loratadine (CLARITIN) tablet 10 mg  10 mg Oral Daily Jeralyn BennettEzequiel Zamora, MD   10 mg at 02/12/14 1329  . mometasone-formoterol (DULERA) 100-5 MCG/ACT inhaler 2 puff  2 puff Inhalation BID Jeralyn BennettEzequiel Zamora, MD   2 puff at 02/13/14 [phone]  . ondansetron (ZOFRAN) tablet 4 mg  4 mg Oral Q6H PRN Jeralyn BennettEzequiel Zamora, MD       Or  . ondansetron (ZOFRAN) injection 4 mg  4 mg Intravenous Q6H PRN Jeralyn BennettEzequiel Zamora, MD   4 mg at 02/04/14 0808  . ondansetron (ZOFRAN) injection 4 mg  4 mg Intravenous TID AC Shanker Levora DredgeM Ghimire, MD   4 mg at 02/13/14 0650  . oxyCODONE-acetaminophen (PERCOCET/ROXICET)  5-325 MG per tablet 1-2 tablet  1-2 tablet Oral Q4H PRN Trevor IhaJames L Deterding, MD   1 tablet at 02/02/14 0038  . pantoprazole (PROTONIX) EC tablet 40 mg  40 mg Oral Q1200 Maretta BeesShanker M Ghimire, MD   40 mg at 02/12/14 1332  . pentafluoroprop-tetrafluoroeth (GEBAUERS) aerosol 1 application  1 application Topical PRN Lauris PoagAlvin C Powell, MD      . pilocarpine (PILOCAR) 1 % ophthalmic solution 1 drop  1 drop Right Eye QID Jeralyn BennettEzequiel Zamora, MD   1 drop at 02/12/14 2224  . polyethylene glycol (MIRALAX / GLYCOLAX) packet 17 g  17 g Oral Daily Maretta BeesShanker M Ghimire, MD   17 g at 02/12/14 1327  . predniSONE (DELTASONE) tablet 60 mg  60 mg Oral Q breakfast Lauris PoagAlvin C Powell, MD   60 mg at 02/13/14 0827  . senna-docusate (Senokot-S) tablet 1 tablet  1 tablet Oral BID Jeralyn BennettEzequiel Zamora, MD   1 tablet at 02/12/14 2225  . sodium chloride 0.9 % injection 3 mL  3 mL Intravenous Q12H Jeralyn BennettEzequiel Zamora, MD   3 mL at 02/12/14 2226  . sodium chloride 0.9 % injection 3 mL  3 mL Intravenous PRN Jeralyn BennettEzequiel Zamora, MD   3 mL at 02/05/14 1033    ROS:  See HPI  PHYSICAL EXAM  Filed Vitals:   02/13/14 1006  BP: 109/61  Pulse: 107  Temp: 97.7 F (36.5 C)  Resp: 20    Gen:  Well developed well nourished HEENT:  normocephalic Neck:  Right IJ Heart:  RRR Lungs:  COPD on O2 at home Abdomen:  Soft NTTP Skin:  No obvious rashes   Lab/X-ray:  Impression: This is a 79 y.o. female here for diatek catheter removal  Plan:  Removal of right diatek catheter   [facility]  02/13/2014 11:30 AM  [facility] Catheter Removal Procedure Note  Diagnosis: ESRD with Functioning AVF/AVGG  Plan:  Remove right diatek catheter  Consent signed:  yes Time out completed:  yes Coumadin:  No. PT/INR (if applicable):   Other labs:   Procedure: 1.  Sterile prepping and draping over catheter area 2. 0 ml 2% lidocaine plain instilled at removal site. 3.  right  catheter removed in its entirety with cuff in  tact. 4.  Complications: none  5. Tip of catheter sent for culture:  no   Patient tolerated procedure well:  yes Pressure held, no bleeding noted, dressing applied Instructions given to the pt regarding wound care and bleeding.  OtherClinton Gallant:  Alecia Doi Captain James A. Lov[facility] Federal Health Care CenterMAUREEN 02/13/2014 11:30 AM

## 2014-02-13 NOTE — Progress Notes (Signed)
Patient ID: [name], female   DOB: 03-20-1934, 79 y.o.   MRN: [id number] S:feels well and wants to go home with son. O:BP 107/52 mmHg  Pulse 91  Temp(Src) 98.7 F (37.1 C) (Oral)  Resp 18  Ht 5\' 8"  (1.727 m)  Wt 83.1 kg (183 lb 3.2 oz)  BMI 27.86 kg/m2  SpO2 97%  Intake/Output Summary (Last 24 hours) at 02/13/14 0954 Last data filed at 02/12/14 0958  Gross per 24 hour  Intake    240 ml  Output      0 ml  Net    240 ml   Intake/Output: I/O last 3 completed shifts: In: 240 [P.O.:240] Out: -   Intake/Output this shift:    Weight change:  JXB:JYNWGNFGen:elderly WF in NAD CVS:no rub Resp:cta AOZ:HYQMVHAbd:benign Ext: no edema   Recent Labs Lab 02/07/14 1215 02/08/14 0442 02/09/14 0432 02/10/14 0505 02/11/14 0835 02/12/14 0605 02/13/14 0609  NA 139 143 140 140 141 139 140  K 3.7 3.6 3.8 3.6 3.4* 3.9 4.0  CL 102 105 103 104 102 105 106  CO2 28 28 26 27 28 23 29   GLUCOSE 209* 102* 86 101* 105* 81 84  BUN 27* 38* 38* 44* 47* 50* 49*  CREATININE 3.79* 3.68* 3.48* 3.19* 2.84* 2.70* 2.57*  ALBUMIN 2.7* 2.7* 2.6* 2.5* 3.1* 2.7* 2.7*  CALCIUM 9.3 9.4 9.1 9.4 9.8 9.3 8.8  PHOS 2.8 3.3 3.0 2.9 3.7 4.0 4.4   Liver Function Tests:  Recent Labs Lab 02/11/14 0835 02/12/14 0605 02/13/14 0609  ALBUMIN 3.1* 2.7* 2.7*   No results for input(s): LIPASE, AMYLASE in the last 168 hours. No results for input(s): AMMONIA in the last 168 hours. CBC:  Recent Labs Lab 02/10/14 0505  WBC 13.2*  HGB 8.6*  HCT 28.1*  MCV 96.6  PLT 241   Cardiac Enzymes: No results for input(s): CKTOTAL, CKMB, CKMBINDEX, TROPONINI in the last 168 hours. CBG: No results for input(s): GLUCAP in the last 168 hours.  Iron Studies: No results for input(s): IRON, TIBC, TRANSFERRIN, FERRITIN in the last 72 hours. Studies/Results: No results found. Marland Kitchen. aspirin EC  81 mg Oral Daily  . darbepoetin (ARANESP) injection - NON-DIALYSIS  150 mcg Subcutaneous Q Wed-1800  . feeding supplement (NEPRO CARB STEADY)  237 mL  Oral BID BM  . heparin subcutaneous  5,000 Units Subcutaneous 3 times per day  . latanoprost  1 drop Both Eyes QHS  . levothyroxine  25 mcg Oral QAC breakfast  . loratadine  10 mg Oral Daily  . mometasone-formoterol  2 puff Inhalation BID  . ondansetron (ZOFRAN) IV  4 mg Intravenous TID AC  . pantoprazole  40 mg Oral Q1200  . pilocarpine  1 drop Right Eye QID  . polyethylene glycol  17 g Oral Daily  . predniSONE  60 mg Oral Q breakfast  . senna-docusate  1 tablet Oral BID  . sodium chloride  3 mL Intravenous Q12H    BMET    Component Value Date/Time   NA 140 02/13/2014 0609   K 4.0 02/13/2014 0609   CL 106 02/13/2014 0609   CO2 29 02/13/2014 0609   GLUCOSE 84 02/13/2014 0609   BUN 49* 02/13/2014 0609   CREATININE 2.57* 02/13/2014 0609   CALCIUM 8.8 02/13/2014 0609   GFRNONAA 17* 02/13/2014 0609   GFRAA 20* 02/13/2014 0609   CBC    Component Value Date/Time   WBC 13.2* 02/10/2014 0505   RBC 2.91* 02/10/2014 0505   HGB 8.6*  02/10/2014 0505   HCT 28.1* 02/10/2014 0505   PLT 241 02/10/2014 0505   MCV 96.6 02/10/2014 0505   MCH 29.6 02/10/2014 0505   MCHC 30.6 02/10/2014 0505   RDW 15.9* 02/10/2014 0505   LYMPHSABS 1.7 02/03/2014 0446   MONOABS 1.2* 02/03/2014 0446   EOSABS 0.8* 02/03/2014 0446   BASOSABS 0.2* 02/03/2014 0446     Assessment/Plan:  1. AKI due to biopsy proven Acute Interstitial Nephritis- responding to steroids although offending agent has not been identified 1. On prednisone 60mg  daily and will need to start tapering slowly over a 2-3 month period once her renal function returns closer to her baseline (continue with 60mg  for another week then decrease to 50 mg daily until she is seen by Dr. Lowell GuitarPowell in 3-4 weeks) 2. Follow up with Dr. Lowell GuitarPowell as an outpt (our office will schedule for f/u in 3-4 weeks) until [place] 3. Nothing further to add, will sign off.  Please call with questions or concerns. 2. Anemia- on  aranesp now SQ 3. Vascular access- for PC removal 4. Protein malnutrition 5. Deconditioning- cont with PT/OT 6. dispo- stable for discharge from renal standpoint once PC removed.  [place] but will stay until she improves clinically.  Call with questions or concerns  Brenisha Tsui A

## 2014-02-13 NOTE — Care Management Note (Signed)
CARE MANAGEMENT NOTE 02/13/2014  Patient:  [name],[name]   Account Number:  [id number]  Date Initiated:  02/01/2014  Documentation initiated by:  [name]  Subjective/Objective Assessment:   CM following for progress and d/c planning.     Action/Plan:   Met with pt family member and IM given current plan is to return to SNF.  02/11/2014 Spoke with pt and son, both agreeable to SNF for rehab post d/c prior to possible move to [place].   Anticipated DC Date:  02/13/2014   Anticipated DC Plan:  [place]         Choice offered to / List presented to:  C-1 Patient        [place] arranged  [place] [facility]      [facility] agency  [place]   Status of service:  Completed, signed off Medicare Important Message given?  YES (If response is "NO", the following Medicare IM given date fields will be blank) Date Medicare IM given:  02/01/2014 Medicare IM given by:  [name] Date Additional Medicare IM given:  02/11/2014 Additional Medicare IM given by:  [name]  Discharge Disposition:    Per UR Regulation:    If discussed at Long Length of Stay Meetings, dates discussed:    Comments:  02/13/2014 Plan now for pt to d/c to home  with HHPT, spoke with pt and son both in agreement that HHPT is needed. Have asked [place] to provide this care, info faxed @ 1440 on 02/13/2014. Pt has DME no further needs identified. HH will most likely begin early next week 1/4-02/19/14 due to holiday. Pt and son informed that services will be delayed due to holiday. CRoyal RN MPH, case manager, [phone]

## 2014-02-13 NOTE — Progress Notes (Signed)
[name] to be D/C'd Home per MD order.  Discussed prescriptions and follow up appointments with the patient. Prescriptions given to patient, medication list explained in detail. Pt verbalized understanding.    Medication List    TAKE these medications        acetaminophen 325 MG tablet  Commonly known as:  TYLENOL  Take 2 tablets (650 mg total) by mouth every 6 (six) hours as needed for mild pain (or Fever >/= 101).     ADVAIR DISKUS 250-50 MCG/DOSE Aepb  Generic drug:  Fluticasone-Salmeterol  Inhale 1 puff into the lungs 2 (two) times daily.     ascorbic acid 1000 MG tablet  Commonly known as:  VITAMIN C  Take 1,000 mg by mouth 3 (three) times daily.     aspirin EC 81 MG tablet  Take 81 mg by mouth daily.     azelastine 0.1 % nasal spray  Commonly known as:  ASTELIN  Place 1 spray into both nostrils daily. Use in each nostril as directed     Brinzolamide-Brimonidine 1-0.2 % Susp  Place 1 drop into both eyes 2 (two) times daily.     cholecalciferol 1000 UNITS tablet  Commonly known as:  VITAMIN D  Take 1,000 Units by mouth daily.     feeding supplement (NEPRO CARB STEADY) Liqd  Take 237 mLs by mouth 2 (two) times daily between meals.     Fish Oil 1000 MG Caps  Take 1 capsule by mouth daily.     folic acid 1 MG tablet  Commonly known as:  FOLVITE  Take 1 tablet (1 mg total) by mouth daily.     Garlic 1000 MG Caps  Take 1 capsule by mouth daily.     latanoprost 0.005 % ophthalmic solution  Commonly known as:  XALATAN  Place 1 drop into both eyes at bedtime.     levothyroxine 25 MCG tablet  Commonly known as:  SYNTHROID, LEVOTHROID  Take 25 mcg by mouth daily before breakfast.     loratadine 10 MG tablet  Commonly known as:  CLARITIN  Take 10 mg by mouth daily.     LUTEIN-ZEAXANTHIN PO  Take 1 tablet by mouth daily.     multivitamin with minerals tablet  Take 1 tablet by mouth daily.     omeprazole 40 MG capsule  Commonly known as:  PRILOSEC  Take  40 mg by mouth daily.     pilocarpine 1 % ophthalmic solution  Commonly known as:  PILOCAR  Place 1 drop into the right eye 4 (four) times daily.     polyethylene glycol packet  Commonly known as:  MIRALAX / GLYCOLAX  Take 17 g by mouth daily.     predniSONE 20 MG tablet  Commonly known as:  DELTASONE  Take 3 tablets (60 mg total) by mouth daily with breakfast.     albuterol (2.5 MG/3ML) 0.083% nebulizer solution  Commonly known as:  PROVENTIL  Take 2.5 mg by nebulization every 4 (four) hours as needed for wheezing or shortness of breath.     PROAIR HFA 108 (90 BASE) MCG/ACT inhaler  Generic drug:  albuterol  Inhale 2 puffs into the lungs every 6 (six) hours as needed for wheezing or shortness of breath.     senna-docusate 8.6-50 MG per tablet  Commonly known as:  Senokot-S  Take 1 tablet by mouth 2 (two) times daily.     thiamine 100 MG tablet  Take 1 tablet (100 mg total) by  mouth daily.        Filed Vitals:   02/13/14 1006  BP: 109/61  Pulse: 107  Temp: 97.7 F (36.5 C)  Resp: 20    Skin clean, dry and intact without evidence of skin break down, no evidence of skin tears noted. IV catheter discontinued intact. Site without signs and symptoms of complications. Dressing and pressure applied. Pt denies pain at this time. No complaints noted.  An After Visit Summary was printed and given to the patient. Patient escorted via WC, and D/C home via private auto.  Strong, [name] 02/13/2014 5:32 PM

## 2014-02-13 NOTE — Discharge Summary (Addendum)
Physician Discharge Summary  [name] ZOX:[id number] DOB: 20-Apr-1934 DOA: 01/29/2014  PCP: No primary care provider on file.  Admit date: 01/29/2014 Discharge date: 02/13/2014  Time spent: 45 minutes  Home with 24 hr supervision and HHPT  Discharge Condition: stable Diet recommendation:  heart healthy  Discharge Diagnoses:  Principal Problem:   AKI (acute kidney injury) Active Problems:   HTN (hypertension)   Other specified hypothyroidism   Abdominal pain, epigastric   Generalized weakness   FTT (failure to thrive) in adult   Protein-calorie malnutrition, severe   History of present illness:  Patient is a 79 year old female who was recently discharged from this hospital on 01/21/14, she presented with ARF of unclear etiology,she briefly required HD, by discharge was improving, HD catheter was removed, and patient was discharged to SNF. She returned on 12/15 with worsening nausea, vomiting, diffuse abdominal pain. Labs showed further worsening in creatinine. Renal/VVS consulted, hemodialysis catheter placed and underwent hemodialysis. She also underwent a renal bx.    Hospital Course:  Principal Problem: Acute renal failure:  - received dialysis short term - Renal biopsy shows diffuse acute interstitial nephritis, eosinophils, predominance of lymphocytes, severe arteriosclerosis.  - has been started on Prednisone with improvement- continue with 60mg  for another week then decrease to 50 mg daily until she is seen by Dr. Lowell Guitar in 3-4 weeks - she will follow with Dr Lowell Guitar until she establishes with a nephrologist back home in [place] - dialysis cath being removed today  Active Problems:  Nausea vomiting and abdominal pain: - related to uremia? - resolved   Anemia of Renal Disease: on Aranesp   Hypothyroidism: - stable, Continue with levothyroxine   GERD: - stable, continue PPI   COPD:  - stable, c/w prn bronchodilators.  Hypotension - slightly low BP  persists- patient asymptomatic    Failure to thrive:  - PT evaluation - recommended home with HHPT and 24 hr supervision- she has 2 sons who will be there around the clock   Severe malnutrition in the context of acute illness or injury: - on Nepro feeding supplement   Consultations: nephrology  vascular surgery   Discharge Exam: Filed Weights   02/09/14 2048 02/10/14 2053 02/11/14 2100  Weight: 83.502 kg (184 lb 1.4 oz) 83.502 kg (184 lb 1.4 oz) 83.1 kg (183 lb 3.2 oz)   Filed Vitals:   02/13/14 1006  BP: 109/61  Pulse: 107  Temp: 97.7 F (36.5 C)  Resp: 20    General: AAO x 3, no distress Cardiovascular: RRR, no murmurs  Respiratory: clear to auscultation bilaterally GI: soft, non-tender, non-distended, bowel sound positive  Discharge Instructions You were cared for by a hospitalist during your hospital stay. If you have any questions about your discharge medications or the care you received while you were in the hospital after you are discharged, you can call the unit and asked to speak with the hospitalist on call if the hospitalist that took care of you is not available. Once you are discharged, your primary care physician will handle any further medical issues. Please note that NO REFILLS for any discharge medications will be authorized once you are discharged, as it is imperative that you return to your primary care physician (or establish a relationship with a primary care physician if you do not have one) for your aftercare needs so that they can reassess your need for medications and monitor your lab values.      Discharge Instructions    Diet -  low sodium heart healthy    Complete by:  As directed   Renal diet     Increase activity slowly    Complete by:  As directed             Medication List    TAKE these medications        acetaminophen 325 MG tablet  Commonly known as:  TYLENOL  Take 2 tablets (650 mg total) by mouth every 6 (six) hours as  needed for mild pain (or Fever >/= 101).     ADVAIR DISKUS 250-50 MCG/DOSE Aepb  Generic drug:  Fluticasone-Salmeterol  Inhale 1 puff into the lungs 2 (two) times daily.     ascorbic acid 1000 MG tablet  Commonly known as:  VITAMIN C  Take 1,000 mg by mouth 3 (three) times daily.     aspirin EC 81 MG tablet  Take 81 mg by mouth daily.     azelastine 0.1 % nasal spray  Commonly known as:  ASTELIN  Place 1 spray into both nostrils daily. Use in each nostril as directed     Brinzolamide-Brimonidine 1-0.2 % Susp  Place 1 drop into both eyes 2 (two) times daily.     cholecalciferol 1000 UNITS tablet  Commonly known as:  VITAMIN D  Take 1,000 Units by mouth daily.     feeding supplement (NEPRO CARB STEADY) Liqd  Take 237 mLs by mouth 2 (two) times daily between meals.     Fish Oil 1000 MG Caps  Take 1 capsule by mouth daily.     folic acid 1 MG tablet  Commonly known as:  FOLVITE  Take 1 tablet (1 mg total) by mouth daily.     Garlic 1000 MG Caps  Take 1 capsule by mouth daily.     latanoprost 0.005 % ophthalmic solution  Commonly known as:  XALATAN  Place 1 drop into both eyes at bedtime.     levothyroxine 25 MCG tablet  Commonly known as:  SYNTHROID, LEVOTHROID  Take 25 mcg by mouth daily before breakfast.     loratadine 10 MG tablet  Commonly known as:  CLARITIN  Take 10 mg by mouth daily.     LUTEIN-ZEAXANTHIN PO  Take 1 tablet by mouth daily.     multivitamin with minerals tablet  Take 1 tablet by mouth daily.     omeprazole 40 MG capsule  Commonly known as:  PRILOSEC  Take 40 mg by mouth daily.     pilocarpine 1 % ophthalmic solution  Commonly known as:  PILOCAR  Place 1 drop into the right eye 4 (four) times daily.     polyethylene glycol packet  Commonly known as:  MIRALAX / GLYCOLAX  Take 17 g by mouth daily.     predniSONE 20 MG tablet  Commonly known as:  DELTASONE  Take 3 tablets (60 mg total) by mouth daily with breakfast.     albuterol  (2.5 MG/3ML) 0.083% nebulizer solution  Commonly known as:  PROVENTIL  Take 2.5 mg by nebulization every 4 (four) hours as needed for wheezing or shortness of breath.     PROAIR HFA 108 (90 BASE) MCG/ACT inhaler  Generic drug:  albuterol  Inhale 2 puffs into the lungs every 6 (six) hours as needed for wheezing or shortness of breath.     senna-docusate 8.6-50 MG per tablet  Commonly known as:  Senokot-S  Take 1 tablet by mouth 2 (two) times daily.     thiamine 100  MG tablet  Take 1 tablet (100 mg total) by mouth daily.       Allergies  Allergen Reactions  . Ciprofloxacin Nausea And Vomiting   Follow-up Information    Follow up with Lauris PoagPOWELL,ALVIN C, MD.   Specialty:  Nephrology   Contact information:   [address]                          [place] [address] [phone]        The results of significant diagnostics from this hospitalization (including imaging, microbiology, ancillary and laboratory) are listed below for reference.    Significant Diagnostic Studies: [place] Abdomen Complete  01/29/2014   CLINICAL DATA:  Abdominal pain, medical renal disease, hypertension  EXAM: ULTRASOUND ABDOMEN COMPLETE  COMPARISON:  Renal ultrasound of January 08, 2014  FINDINGS: Gallbladder: The gallbladder is adequately distended and contains multiple echogenic mobile shadowing stones. There is no gallbladder wall thickening, pericholecystic fluid, or positive sonographic Murphy's sign.  Common bile duct: Diameter: 6.5 mm  Liver: No focal lesion identified. Within normal limits in parenchymal echogenicity.  IVC: No abnormality visualized.  Pancreas: Largely obscured by bowel gas.  Spleen: Size and appearance within normal limits.  Right Kidney: Length: 13.8 cm. Echogenicity of the cortex is mildly increased. No mass or hydronephrosis visualized. There is a midpole cyst measuring 1.2 cm in diameter.  Left Kidney: Length: 12.5 cm. Echogenicity of the cortex is mildly increased. No mass or  hydronephrosis visualized.  Abdominal aorta: No aneurysm visualized.  Other findings: None.  IMPRESSION: 1. Gallstones without evidence of acute cholecystitis. 2. Increased renal cortical echotexture consistent with medical renal disease. This is similar to that seen previously.   Electronically Signed   By: David  [place]   On: 01/29/2014 13:26   [place] Biopsy-no Radiologist  02/04/2014   : No report.  Please see physicians notes   Electronically Signed   By: Leslye Peerad  Services   On: 02/04/2014 11:47   Dg Chest Port 1 View  01/30/2014   CLINICAL DATA:  Dialysis catheter insertion.  EXAM: PORTABLE CHEST - 1 VIEW  COMPARISON:  01/29/2014.  FINDINGS: Trachea is midline. Heart size stable. Right IJ dialysis catheter tips project over the SVC. Biapical pleural thickening. Question developing airspace disease in the left lower lobe. Lungs are otherwise clear. No pneumothorax. No definite pleural fluid.  IMPRESSION: 1. Right IJ dialysis catheter insertion without pneumothorax. 2. Question developing left lower lobe airspace disease.   Electronically Signed   By: Leanna BattlesMelinda  Blietz M.D.   On: 01/30/2014 13:48   Dg Chest Port 1 View  01/29/2014   CLINICAL DATA:  New onset of nausea and shortness of Breath  EXAM: PORTABLE CHEST - 1 VIEW  COMPARISON:  01/09/2014  FINDINGS: Cardiomegaly again noted. Atherosclerotic calcifications of thoracic aorta. No acute infiltrate or pulmonary edema. Mild basilar atelectasis.  IMPRESSION: No active disease.  Mild basilar atelectasis.   Electronically Signed   By: Natasha MeadLiviu  Pop M.D.   On: 01/29/2014 08:38   Dg Fluoro Guide Cv Line-no Report  01/30/2014   CLINICAL DATA:    FLOURO GUIDE CV LINE  Fluoroscopy was utilized by the requesting physician.  No radiographic  interpretation.     Microbiology: No results found for this or any previous visit (from the past 240 hour(s)).   Labs: Basic Metabolic Panel:  Recent Labs Lab 02/09/14 0432 02/10/14 0505 02/11/14 0835 02/12/14 0605  02/13/14 0609  NA 140 140 141  139 140  K 3.8 3.6 3.4* 3.9 4.0  CL 103 104 102 105 106  CO2 26 27 28 23 29   GLUCOSE 86 101* 105* 81 84  BUN 38* 44* 47* 50* 49*  CREATININE 3.48* 3.19* 2.84* 2.70* 2.57*  CALCIUM 9.1 9.4 9.8 9.3 8.8  PHOS 3.0 2.9 3.7 4.0 4.4   Liver Function Tests:  Recent Labs Lab 02/09/14 0432 02/10/14 0505 02/11/14 0835 02/12/14 0605 02/13/14 0609  ALBUMIN 2.6* 2.5* 3.1* 2.7* 2.7*   No results for input(s): LIPASE, AMYLASE in the last 168 hours. No results for input(s): AMMONIA in the last 168 hours. CBC:  Recent Labs Lab 02/10/14 0505  WBC 13.2*  HGB 8.6*  HCT 28.1*  MCV 96.6  PLT 241   Cardiac Enzymes: No results for input(s): CKTOTAL, CKMB, CKMBINDEX, TROPONINI in the last 168 hours. BNP: BNP (last 3 results) No results for input(s): PROBNP in the last 8760 hours. CBG: No results for input(s): GLUCAP in the last 168 hours.     SignedCalvert Cantor, MD [facility] 02/13/2014, 10:54 AM

## 2014-02-13 NOTE — Progress Notes (Signed)
Physical Therapy Treatment Patient Details Name: [name] MRN: [id number] DOB: Feb 20, 1934 Today's Date: 02/13/2014    History of Present Illness Patient is a 79 yo female admitted 01/29/14 with weakness, AKI, FTT.  PMH:  ESRD on HD, HTN, COPD    PT Comments    Pt progressing towards physical therapy goals. Was able to improve ambulation distance with HR remaining <80bpm throughout. Pt on 2L/min supplemental O2 throughout session with sats maintained >93%. Pt and son wanting d/c home today. PT continues to recommend SNF for optimal return of function, however feel that pt will be able to manage at home with 24 hour assist and HHPT/OT to follow.   Follow Up Recommendations  SNF;Supervision/Assistance - 24 hour     Equipment Recommendations  Rolling walker with 5" wheels    Recommendations for Other Services       Precautions / Restrictions Precautions Precautions: Fall Restrictions Weight Bearing Restrictions: No    Mobility  Bed Mobility Overal bed mobility: Needs Assistance Bed Mobility: Supine to Sit     Supine to sit: Supervision     General bed mobility comments: with rail, pt able to get self to EOB with slow mvmts  Transfers Overall transfer level: Needs assistance Equipment used: Rolling walker (2 wheeled) Transfers: Sit to/from Stand Sit to Stand: Min guard         General transfer comment: Pt able to power-up to full standing with steadying assistance. VC's for hand placement on seated surface for safety.  Ambulation/Gait Ambulation/Gait assistance: Min guard Ambulation Distance (Feet): 200 Feet (100', seated rest break, 100') Assistive device: Rolling walker (2 wheeled) Gait Pattern/deviations: Step-through pattern;Decreased stride length;Trunk flexed;Narrow base of support Gait velocity: Decreased Gait velocity interpretation: Below normal speed for age/gender General Gait Details: Pt was able to improve ambulation distance. O2 and HR was  monitored throughout gait training - >93% O2 and <80bpm HR. 1 seated rest break ~4 minutes between bouts of ambulation.    Stairs            Wheelchair Mobility    Modified Rankin (Stroke Patients Only)       Balance Overall balance assessment: Needs assistance Sitting-balance support: Feet supported;No upper extremity supported Sitting balance-Leahy Scale: Good Sitting balance - Comments: Due to scoliosis(?) pt with L shoulder elevation and R trunk lean.   Standing balance support: Bilateral upper extremity supported;During functional activity Standing balance-Leahy Scale: Fair Standing balance comment: Feel pt could maintain static standing without UE assist but will require support for dynamic movements.                     Cognition Arousal/Alertness: Awake/alert Behavior During Therapy: Flat affect Overall Cognitive Status: Within Functional Limits for tasks assessed                      Exercises      General Comments        Pertinent Vitals/Pain Pain Assessment: No/denies pain    Home Living                      Prior Function            PT Goals (current goals can now be found in the care plan section) Acute Rehab PT Goals Patient Stated Goal: Home today PT Goal Formulation: With patient/family Time For Goal Achievement: 02/09/14 Potential to Achieve Goals: Good Progress towards PT goals: Progressing toward goals    Frequency  Min 3X/week    PT Plan Current plan remains appropriate    Co-evaluation             End of Session Equipment Utilized During Treatment: Gait belt;Oxygen Activity Tolerance: Patient limited by fatigue Patient left: in chair;with call bell/phone within reach;with family/visitor present     Time: 1001-1025 PT Time Calculation (min) (ACUTE ONLY): 24 min  Charges:  $Gait Training: 8-22 mins $Therapeutic Activity: 8-22 mins                    G Codes:      [name],  [name] 12/30/201[name], 10:58 AM   [name] SlipperLaura Yan Strong, PT, DPT Acute Rehabilitation Services Pager: [phone]

## 2014-09-26 ENCOUNTER — Emergency Department (HOSPITAL_COMMUNITY): Payer: Medicare Other

## 2014-09-26 ENCOUNTER — Encounter (HOSPITAL_COMMUNITY): Payer: Self-pay | Admitting: Emergency Medicine

## 2014-09-26 ENCOUNTER — Emergency Department (HOSPITAL_COMMUNITY)
Admission: EM | Admit: 2014-09-26 | Discharge: 2014-09-27 | Disposition: A | Discharge status: Home / Self Care | Payer: Medicare Other | Attending: Emergency Medicine | Admitting: Emergency Medicine

## 2014-09-26 DIAGNOSIS — H409 Unspecified glaucoma: Secondary | ICD-10-CM | POA: Diagnosis not present

## 2014-09-26 DIAGNOSIS — J449 Chronic obstructive pulmonary disease, unspecified: Secondary | ICD-10-CM | POA: Insufficient documentation

## 2014-09-26 DIAGNOSIS — Z79899 Other long term (current) drug therapy: Secondary | ICD-10-CM | POA: Diagnosis not present

## 2014-09-26 DIAGNOSIS — E039 Hypothyroidism, unspecified: Secondary | ICD-10-CM | POA: Diagnosis not present

## 2014-09-26 DIAGNOSIS — I10 Essential (primary) hypertension: Secondary | ICD-10-CM | POA: Diagnosis not present

## 2014-09-26 DIAGNOSIS — Z87891 Personal history of nicotine dependence: Secondary | ICD-10-CM | POA: Insufficient documentation

## 2014-09-26 DIAGNOSIS — R4182 Altered mental status, unspecified: Secondary | ICD-10-CM | POA: Diagnosis present

## 2014-09-26 DIAGNOSIS — Z7952 Long term (current) use of systemic steroids: Secondary | ICD-10-CM | POA: Diagnosis not present

## 2014-09-26 DIAGNOSIS — R5383 Other fatigue: Secondary | ICD-10-CM | POA: Insufficient documentation

## 2014-09-26 DIAGNOSIS — N289 Disorder of kidney and ureter, unspecified: Secondary | ICD-10-CM | POA: Diagnosis not present

## 2014-09-26 DIAGNOSIS — Z7951 Long term (current) use of inhaled steroids: Secondary | ICD-10-CM | POA: Diagnosis not present

## 2014-09-26 LAB — COMPREHENSIVE METABOLIC PANEL
ALK PHOS: 108 U/L (ref 38–126)
ALT: 12 U/L — ABNORMAL LOW (ref 14–54)
ANION GAP: 6 (ref 5–15)
AST: 17 U/L (ref 15–41)
Albumin: 3.1 g/dL — ABNORMAL LOW (ref 3.5–5.0)
BUN: 19 mg/dL (ref 6–20)
CALCIUM: 10.7 mg/dL — AB (ref 8.9–10.3)
CO2: 18 mmol/L — ABNORMAL LOW (ref 22–32)
Chloride: 112 mmol/L — ABNORMAL HIGH (ref 101–111)
Creatinine, Ser: 2.1 mg/dL — ABNORMAL HIGH (ref 0.44–1.00)
GFR calc Af Amer: 25 mL/min — ABNORMAL LOW (ref >60)
GFR calc non Af Amer: 22 mL/min — ABNORMAL LOW (ref >60)
Glucose, Bld: 120 mg/dL — ABNORMAL HIGH (ref 65–99)
POTASSIUM: 5 mmol/L (ref 3.5–5.1)
Sodium: 136 mmol/L (ref 135–145)
TOTAL PROTEIN: 6.9 g/dL (ref 6.5–8.1)
Total Bilirubin: 0.8 mg/dL (ref 0.3–1.2)

## 2014-09-26 LAB — CBC
HCT: 35 % — ABNORMAL LOW (ref 36.0–46.0)
Hemoglobin: 10.5 g/dL — ABNORMAL LOW (ref 12.0–15.0)
MCH: 29.2 pg (ref 26.0–34.0)
MCHC: 30 g/dL (ref 30.0–36.0)
MCV: 97.5 fL (ref 78.0–100.0)
PLATELETS: 359 10*3/uL (ref 150–400)
RBC: 3.59 MIL/uL — ABNORMAL LOW (ref 3.87–5.11)
RDW: 16.1 % — ABNORMAL HIGH (ref 11.5–15.5)
WBC: 6.8 10*3/uL (ref 4.0–10.5)

## 2014-09-26 LAB — URINE MICROSCOPIC-ADD ON

## 2014-09-26 LAB — URINALYSIS, ROUTINE W REFLEX MICROSCOPIC
BILIRUBIN URINE: NEGATIVE
Glucose, UA: NEGATIVE mg/dL
Ketones, ur: NEGATIVE mg/dL
LEUKOCYTES UA: NEGATIVE
NITRITE: NEGATIVE
PH: 6.5 (ref 5.0–8.0)
Protein, ur: 30 mg/dL — AB
SPECIFIC GRAVITY, URINE: 1.007 (ref 1.005–1.030)
UROBILINOGEN UA: 0.2 mg/dL (ref 0.0–1.0)

## 2014-09-26 MED ORDER — IOHEXOL 300 MG/ML  SOLN
25.0000 mL | INTRAMUSCULAR | Status: AC
  Administered 2014-09-26: 25 mL via ORAL

## 2014-09-26 MED ORDER — SODIUM CHLORIDE 0.9 % IV BOLUS (SEPSIS)
1000.0000 mL | Freq: Once | INTRAVENOUS | Status: AC
  Administered 2014-09-26: 1000 mL via INTRAVENOUS

## 2014-09-26 NOTE — ED Provider Notes (Signed)
CSN: [id number]     Arrival date & time 09/26/14  1659 History   First MD Initiated Contact with Patient 09/26/14 1713     Chief Complaint  Patient presents with  . Altered Mental Status   HPI    80 year old female presents with family today for fatigue. They report multiple hospital admissions over the last year. She reports the most recent was 2 weeks ago where she was made to [facility] for urinary tract infection. She reports she was released 4 days ago, at that time she is extremely fatigued, with difficulty ambulating. Family notes that she lives with them, has been staying in the living room as she has difficulty getting into her bedroom. They report she does have home health, who came and saw her today. They report that they're concerned that her kidneys are causing her problems as she has had to have inpatient dialysis due to acute kidney injury. They report she has been fatigued, responds to questioning, and alert and oriented. They denied any fevers, cough, or any specific complaints other than the fatigue.   Past Medical History  Diagnosis Date  . Hypertension   . Hypothyroidism   . Seasonal allergies   . Glaucoma   . COPD (chronic obstructive pulmonary disease)   . Former smoker   . Renal disorder    Past Surgical History  Procedure Laterality Date  . Insertion of dialysis catheter Right 01/30/2014    Procedure: INSERTION OF DIALYSIS CATHETER RIGHT INTERNAL JUGULAR;  Surgeon: Pryor Ochoa, MD;  Location: [facility] OR;  Service: Vascular;  Laterality: Right;   No family history on file. Social History  Substance Use Topics  . Smoking status: Former Games developer  . Smokeless tobacco: None  . Alcohol Use: No   OB History    No data available     Review of Systems  All other systems reviewed and are negative.   Allergies  Ciprofloxacin  Home Medications   Prior to Admission medications   Medication Sig Start Date End Date Taking? Authorizing Provider   acetaminophen (TYLENOL) 325 MG tablet Take 2 tablets (650 mg total) by mouth every 6 (six) hours as needed for mild pain (or Fever >/= 101). 02/13/14  Yes Calvert Cantor, MD  albuterol (PROVENTIL) (2.5 MG/3ML) 0.083% nebulizer solution Take 2.5 mg by nebulization every 4 (four) hours as needed for wheezing or shortness of breath.   Yes Historical Provider, MD  ALPRAZolam Prudy Feeler) 0.5 MG tablet Take 0.5 mg by mouth every 6 (six) hours as needed for anxiety.   Yes Historical Provider, MD  Brinzolamide-Brimonidine 1-0.2 % SUSP Place 1 drop into both eyes 2 (two) times daily.   Yes Historical Provider, MD  cyproheptadine (PERIACTIN) 4 MG tablet Take 4 mg by mouth 2 (two) times daily as needed (for appetite).   Yes Historical Provider, MD  Fluticasone-Salmeterol (ADVAIR DISKUS) 250-50 MCG/DOSE AEPB Inhale 2 puffs into the lungs 2 (two) times daily.    Yes Historical Provider, MD  guaiFENesin-dextromethorphan (ROBITUSSIN DM) 100-10 MG/5ML syrup Take 10 mLs by mouth every 6 (six) hours as needed for cough.   Yes Historical Provider, MD  lactulose (CHRONULAC) 10 GM/15ML solution Take 20 g by mouth 2 (two) times daily.   Yes Historical Provider, MD  latanoprost (XALATAN) 0.005 % ophthalmic solution Place 1 drop into both eyes at bedtime.   Yes Historical Provider, MD  levothyroxine (SYNTHROID, LEVOTHROID) 25 MCG tablet Take 25 mcg by mouth daily before breakfast.   Yes Historical  Provider, MD  metoprolol tartrate (LOPRESSOR) 25 MG tablet Take 12.5 mg by mouth 2 (two) times daily.   Yes Historical Provider, MD  midodrine (PROAMATINE) 5 MG tablet Take 5 mg by mouth 3 (three) times daily with meals.   Yes Historical Provider, MD  omeprazole (PRILOSEC) 40 MG capsule Take 40 mg by mouth daily.   Yes Historical Provider, MD  ondansetron (ZOFRAN) 8 MG tablet Take 8 mg by mouth every 4 (four) hours as needed for nausea or vomiting.   Yes Historical Provider, MD  pilocarpine (PILOCAR) 1 % ophthalmic solution Place 1 drop  into the right eye 4 (four) times daily.   Yes Historical Provider, MD  potassium chloride (KLOR-CON) 20 MEQ packet Take 20 mEq by mouth 2 (two) times daily.   Yes Historical Provider, MD  PROAIR HFA 108 (90 BASE) MCG/ACT inhaler Inhale 2 puffs into the lungs every 6 (six) hours as needed for wheezing or shortness of breath.  11/20/13  Yes Historical Provider, MD  Tiotropium Bromide Monohydrate (SPIRIVA RESPIMAT) 2.5 MCG/ACT AERS Inhale 2.5 mcg into the lungs daily.   Yes Historical Provider, MD  folic acid (FOLVITE) 1 MG tablet Take 1 tablet (1 mg total) by mouth daily. Patient not taking: Reported on 09/26/2014 01/21/14   Edsel Petrin, DO  Nutritional Supplements (FEEDING SUPPLEMENT, NEPRO CARB STEADY,) LIQD Take 237 mLs by mouth 2 (two) times daily between meals. Patient not taking: Reported on 09/26/2014 02/13/14   Calvert Cantor, MD  polyethylene glycol (MIRALAX / GLYCOLAX) packet Take 17 g by mouth daily. Patient not taking: Reported on 09/26/2014 02/13/14   Calvert Cantor, MD  predniSONE (DELTASONE) 20 MG tablet Take 3 tablets (60 mg total) by mouth daily with breakfast. Patient not taking: Reported on 09/26/2014 02/13/14   Calvert Cantor, MD  senna-docusate (SENOKOT-S) 8.6-50 MG per tablet Take 1 tablet by mouth 2 (two) times daily. Patient not taking: Reported on 09/26/2014 01/21/14   Nita Sells Mikhail, DO  thiamine 100 MG tablet Take 1 tablet (100 mg total) by mouth daily. Patient not taking: Reported on 09/26/2014 01/21/14   Maryann Mikhail, DO   BP 113/68 mmHg  Pulse 88  Resp 22  SpO2 97%   Physical Exam  Constitutional: She is oriented to person, place, and time. She appears well-developed and well-nourished. No distress.  Obviously fatigued, responds to questioning with appropriate answers. Patient conscious alert and oriented to person place time and event  HENT:  Head: Normocephalic and atraumatic.  Eyes: Conjunctivae are normal. Pupils are equal, round, and reactive to light. Right eye  exhibits no discharge. Left eye exhibits no discharge. No scleral icterus.  Neck: Normal range of motion. No JVD present. No tracheal deviation present.  Cardiovascular: Normal rate.   Pulmonary/Chest: Effort normal and breath sounds normal. No stridor. No respiratory distress. She has no wheezes. She has no rales.  Abdominal: Soft. Bowel sounds are normal. She exhibits no distension and no mass. There is tenderness. There is no rebound and no guarding.  Musculoskeletal: Normal range of motion. She exhibits no edema or tenderness.  Neurological: She is alert and oriented to person, place, and time. Coordination normal.  Skin: Skin is warm and dry.  Psychiatric: She has a normal mood and affect. Her behavior is normal. Judgment and thought content normal.  Nursing note and vitals reviewed.   ED Course  Procedures (including critical care time) Labs Review Labs Reviewed  COMPREHENSIVE METABOLIC PANEL - Abnormal; Notable for the following:    Chloride 112 (*)  CO2 18 (*)    Glucose, Bld 120 (*)    Creatinine, Ser 2.10 (*)    Calcium 10.7 (*)    Albumin 3.1 (*)    ALT 12 (*)    GFR calc non Af Amer 22 (*)    GFR calc Af Amer 25 (*)    All other components within normal limits  CBC - Abnormal; Notable for the following:    RBC 3.59 (*)    Hemoglobin 10.5 (*)    HCT 35.0 (*)    RDW 16.1 (*)    All other components within normal limits  URINALYSIS, ROUTINE W REFLEX MICROSCOPIC (NOT AT [facility]) - Abnormal; Notable for the following:    Hgb urine dipstick TRACE (*)    Protein, ur 30 (*)    All other components within normal limits  URINE MICROSCOPIC-ADD ON - Abnormal; Notable for the following:    Casts HYALINE CASTS (*)    All other components within normal limits    Imaging Review Ct Abdomen Pelvis Wo Contrast  09/27/2014   CLINICAL DATA:  Abdominal pain and lethargy. Recent urinary tract infections  EXAM: CT ABDOMEN AND PELVIS WITHOUT CONTRAST  TECHNIQUE: Multidetector CT imaging  of the abdomen and pelvis was performed following the standard protocol without IV contrast. Oral contrast was administered.  COMPARISON:  Abdominal ultrasound January 29, 2014  FINDINGS: There is a bulla in the right middle lobe. There is mild scarring in the lung bases. There is no lung base edema or consolidation. There are scattered foci of coronary artery calcification.  The liver contour is somewhat lobular with prominence of the caudate lobe. Suspect a degree of underlying hepatic cirrhosis. No focal liver lesions are identified on this nonintravenous contrast enhanced study. The gallbladder is filled with calculi. There is no gallbladder wall thickening or pericholecystic fluid. There is no biliary duct dilatation.  Spleen, pancreas, and adrenals appear normal.  Kidneys bilaterally show no appreciable mass or hydronephrosis on either side. There is an extrarenal pelvis on the right, an anatomic variant. There is no renal or ureteral calculus on either side.  There is a filter in the inferior vena cava.  In the pelvis, the urinary bladder is midline with normal wall thickness. The uterus is anteverted. There is no pelvic or adnexal mass.  There are multiple sigmoid and descending colonic diverticula without diverticulitis. The appendix appears normal.  There is no bowel obstruction.  No free air or portal venous air.  There is no appreciable ascites, adenopathy, or abscess in the abdomen or pelvis. There is atherosclerotic change in the aorta and common iliac arteries without aneurysmal dilatation. There is degenerative change in the lumbar spine. There is vacuum phenomenon at L5-S1. No blastic or lytic bone lesions identified.  IMPRESSION: The contour of the liver raises question of a degree of underlying hepatic cirrhosis. Appropriate laboratory correlation advised.  Cholelithiasis.  Filter in inferior vena cava.  Colonic diverticulosis without diverticulitis. No bowel obstruction. No abscess. Appendix  appears normal.  No renal or ureteral calculi.  No hydronephrosis.   Electronically Signed   By: Bretta Bang III M.D.   On: 09/27/2014 01:18   Dg Chest 2 View  09/26/2014   CLINICAL DATA:  Patient presents to ED via [facility] with altered mental status. Patient recently admitted and discharged from [facility] for UTI. Family requested patient to come here for evaluation. Patient alert and answers questions appropriately.  EXAM: CHEST  2 VIEW  COMPARISON:  09/18/2014  FINDINGS: Cardiac silhouette normal in size and configuration. No mediastinal or hilar masses or convincing adenopathy. There is some linear and reticular opacity at the lung bases similar to the prior study consistent with chronic atelectasis, scarring or a combination. Lungs are mildly hyperexpanded. No convincing pneumonia and no pulmonary edema. No pleural effusion or pneumothorax.  Bony thorax is demineralized but grossly intact.  IMPRESSION: No acute cardiopulmonary disease.   Electronically Signed   By: Amie Portland M.D.   On: 09/26/2014 20:03   I, Zyir Gassert Todd, personally reviewed and evaluated these images and lab results as part of my medical decision-making.   EKG Interpretation   Date/Time:  Thursday September 26 2014 17:17:00 EDT Ventricular Rate:  98 PR Interval:  172 QRS Duration: 95 QT Interval:  355 QTC Calculation: 453 R Axis:   10 Text Interpretation:  Sinus tachycardia Ventricular trigeminy Low voltage,  extremity and precordial leads No significant change since last tracing  Confirmed by FLOYD MD, Reuel Boom ([id number]) on 09/26/2014 6:09:22 PM      MDM   Final diagnoses:  Other fatigue  Renal function impairment    Labs: Urinalysis, CMP, CBC- significant for chloride of 112, CO2 of 18, creatinine of 2.10  Imaging: CT abdomen and pelvis with oral contrast- contour liver raises questions of degree of underlying hepatic cirrhosis, cholelithiasis, colonic diverticulosis without  diverticulitis.  Consults:  Therapeutics: Normal saline  Discharge Meds:   Assessment/Plan: Patient presents with reported altered mental status and fatigue. Patient was reportedly not responding, after further questioning with family and patient she was sleeping, as she is been significantly tired after her ED hospital admission. Patient since being aroused has been conscious alert and oriented has no specific complaints other than fatigue. They report she has been tolerating by mouth intake, has very little appetite but will drink water and boost. She is afebrile, vital signs reassuring here in the ED. She has a creatinine of 2.10, this is lower than any previous creatinine we have here in the ED. She has a significant history of elevated creatinine at other hospitals requiring dialysis. Uncertain what her new baseline is, but family notes that today's results are not abnormal for her. Patient did have some abdominal tenderness on exam, CT scan results shown above, patient advised to share these results with primary care provider. Patient's son notes that she has been told this before approximately 25 years ago. Patient will be discharged home with her family, she lives with them. She has close follow-up with her primary care provider, she has home health care with nurses coming out on a daily basis to assist her. She is instructed to follow-up with primary care in 2 days for reevaluation, recheck of labs. They're instructed to share all relevant data from today's visit with their primary care provider. They're given strict return precautions, they verbalized understanding and agreement today's plan had no further questions or concerns at the time of discharge.        Eyvonne Mechanic, PA-C 09/27/14 0127  Eyvonne Mechanic, PA-C 09/27/14 0141  Melene Plan, DO 09/27/14 1729

## 2014-09-26 NOTE — ED Notes (Signed)
Patient presents to ED via [facility] with altered mental status.  Patient recently admitted and discharged from [facility] for UTI.  Family requested patient to come here for evaluation.  Patient alert and answers questions appropriately.

## 2014-09-27 ENCOUNTER — Emergency Department (HOSPITAL_COMMUNITY): Payer: Medicare Other

## 2014-09-27 DIAGNOSIS — R5383 Other fatigue: Secondary | ICD-10-CM | POA: Diagnosis not present

## 2014-09-27 NOTE — Discharge Instructions (Signed)
Please monitor for new or worsening signs or symptoms, return immediately if any present. Please contact her primary care provider inform them of your visit and all relevant data including laboratory and diagnostic imaging studies. I have attached copies of your CT scan, and labs here in please share these with her primary care provider. Please have labs drawn in 2 days for reevaluation and inferior kidney function.  Results for orders placed or performed during the hospital encounter of 09/26/14  Comprehensive metabolic panel  Result Value Ref Range   Sodium 136 135 - 145 mmol/L   Potassium 5.0 3.5 - 5.1 mmol/L   Chloride 112 (H) 101 - 111 mmol/L   CO2 18 (L) 22 - 32 mmol/L   Glucose, Bld 120 (H) 65 - 99 mg/dL   BUN 19 6 - 20 mg/dL   Creatinine, Ser 1.61 (H) 0.44 - 1.00 mg/dL   Calcium 09.6 (H) 8.9 - 10.3 mg/dL   Total Protein 6.9 6.5 - 8.1 g/dL   Albumin 3.1 (L) 3.5 - 5.0 g/dL   AST 17 15 - 41 U/L   ALT 12 (L) 14 - 54 U/L   Alkaline Phosphatase 108 38 - 126 U/L   Total Bilirubin 0.8 0.3 - 1.2 mg/dL   GFR calc non Af Amer 22 (L) >60 mL/min   GFR calc Af Amer 25 (L) >60 mL/min   Anion gap 6 5 - 15  CBC  Result Value Ref Range   WBC 6.8 4.0 - 10.5 K/uL   RBC 3.59 (L) 3.87 - 5.11 MIL/uL   Hemoglobin 10.5 (L) 12.0 - 15.0 g/dL   HCT 04.5 (L) 40.9 - 81.1 %   MCV 97.5 78.0 - 100.0 fL   MCH 29.2 26.0 - 34.0 pg   MCHC 30.0 30.0 - 36.0 g/dL   RDW 91.4 (H) 78.2 - 95.6 %   Platelets 359 150 - 400 K/uL  Urinalysis, Routine w reflex microscopic (not at [facility])  Result Value Ref Range   Color, Urine YELLOW YELLOW   APPearance CLEAR CLEAR   Specific Gravity, Urine 1.007 1.005 - 1.030   pH 6.5 5.0 - 8.0   Glucose, UA NEGATIVE NEGATIVE mg/dL   Hgb urine dipstick TRACE (A) NEGATIVE   Bilirubin Urine NEGATIVE NEGATIVE   Ketones, ur NEGATIVE NEGATIVE mg/dL   Protein, ur 30 (A) NEGATIVE mg/dL   Urobilinogen, UA 0.2 0.0 - 1.0 mg/dL   Nitrite NEGATIVE NEGATIVE   Leukocytes, UA NEGATIVE NEGATIVE    Urine microscopic-add on  Result Value Ref Range   Squamous Epithelial / LPF RARE RARE   WBC, UA 0-2 <3 WBC/hpf   RBC / HPF 0-2 <3 RBC/hpf   Bacteria, UA RARE RARE   Casts HYALINE CASTS (A) NEGATIVE   Ct Abdomen Pelvis Wo Contrast  09/27/2014   CLINICAL DATA:  Abdominal pain and lethargy. Recent urinary tract infections  EXAM: CT ABDOMEN AND PELVIS WITHOUT CONTRAST  TECHNIQUE: Multidetector CT imaging of the abdomen and pelvis was performed following the standard protocol without IV contrast. Oral contrast was administered.  COMPARISON:  Abdominal ultrasound January 29, 2014  FINDINGS: There is a bulla in the right middle lobe. There is mild scarring in the lung bases. There is no lung base edema or consolidation. There are scattered foci of coronary artery calcification.  The liver contour is somewhat lobular with prominence of the caudate lobe. Suspect a degree of underlying hepatic cirrhosis. No focal liver lesions are identified on this nonintravenous contrast enhanced study. The gallbladder  is filled with calculi. There is no gallbladder wall thickening or pericholecystic fluid. There is no biliary duct dilatation.  Spleen, pancreas, and adrenals appear normal.  Kidneys bilaterally show no appreciable mass or hydronephrosis on either side. There is an extrarenal pelvis on the right, an anatomic variant. There is no renal or ureteral calculus on either side.  There is a filter in the inferior vena cava.  In the pelvis, the urinary bladder is midline with normal wall thickness. The uterus is anteverted. There is no pelvic or adnexal mass.  There are multiple sigmoid and descending colonic diverticula without diverticulitis. The appendix appears normal.  There is no bowel obstruction.  No free air or portal venous air.  There is no appreciable ascites, adenopathy, or abscess in the abdomen or pelvis. There is atherosclerotic change in the aorta and common iliac arteries without aneurysmal dilatation.  There is degenerative change in the lumbar spine. There is vacuum phenomenon at L5-S1. No blastic or lytic bone lesions identified.  IMPRESSION: The contour of the liver raises question of a degree of underlying hepatic cirrhosis. Appropriate laboratory correlation advised.  Cholelithiasis.  Filter in inferior vena cava.  Colonic diverticulosis without diverticulitis. No bowel obstruction. No abscess. Appendix appears normal.  No renal or ureteral calculi.  No hydronephrosis.   Electronically Signed   By: Bretta Bang III M.D.   On: 09/27/2014 01:18   Dg Chest 2 View  09/26/2014   CLINICAL DATA:  Patient presents to ED via [facility] with altered mental status. Patient recently admitted and discharged from [facility] for UTI. Family requested patient to come here for evaluation. Patient alert and answers questions appropriately.  EXAM: CHEST  2 VIEW  COMPARISON:  09/18/2014  FINDINGS: Cardiac silhouette normal in size and configuration. No mediastinal or hilar masses or convincing adenopathy. There is some linear and reticular opacity at the lung bases similar to the prior study consistent with chronic atelectasis, scarring or a combination. Lungs are mildly hyperexpanded. No convincing pneumonia and no pulmonary edema. No pleural effusion or pneumothorax.  Bony thorax is demineralized but grossly intact.  IMPRESSION: No acute cardiopulmonary disease.   Electronically Signed   By: Amie Portland M.D.   On: 09/26/2014 20:03

## 2016-02-21 IMAGING — CR DG CHEST 1V PORT
1 series · 1 of 1 positions shown · non-contrast
Comparison: 01/08/2014

CLINICAL DATA: Hypoxia

EXAM:
PORTABLE CHEST - 1 VIEW

[portable]
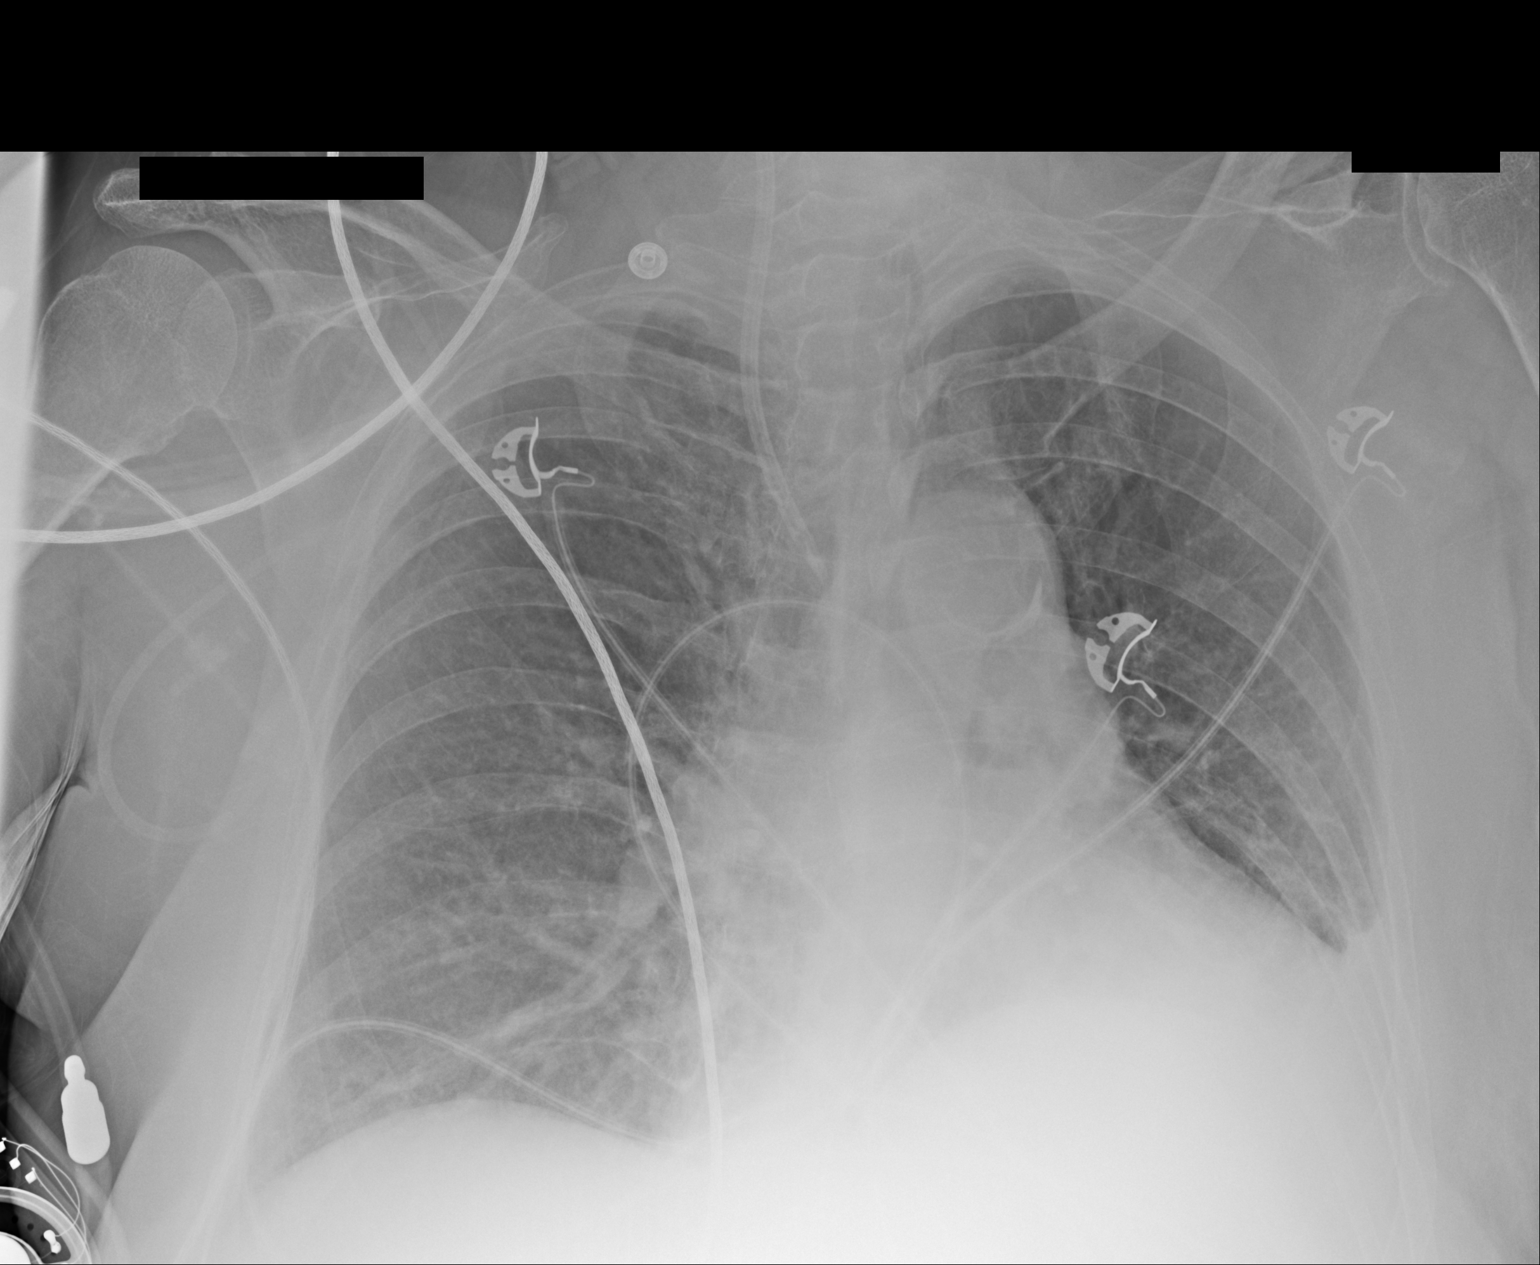

[1 of 1 positions shown; findings below may reference images not displayed]

FINDINGS: Cardiomediastinal silhouette is stable. Mild elevation of the left
hemidiaphragm. There is central mild vascular congestion and mild
interstitial prominence suspicious for mild interstitial edema.
Small left pleural effusion with left basilar atelectasis or
infiltrate. Right IJ sheath is unchanged in position.
IMPRESSION: Mild vascular congestion mild interstitial prominence suspicious for
mild interstitial edema. Small left pleural effusion with left
basilar atelectasis or infiltrate.

## 2016-02-25 IMAGING — CR DG ABD PORTABLE 1V
1 series · 1 of 1 positions shown · non-contrast
Comparison: January 08, 2014.

CLINICAL DATA: Generalized abdominal pain, nausea.

EXAM:
PORTABLE ABDOMEN - 1 VIEW

[AP]
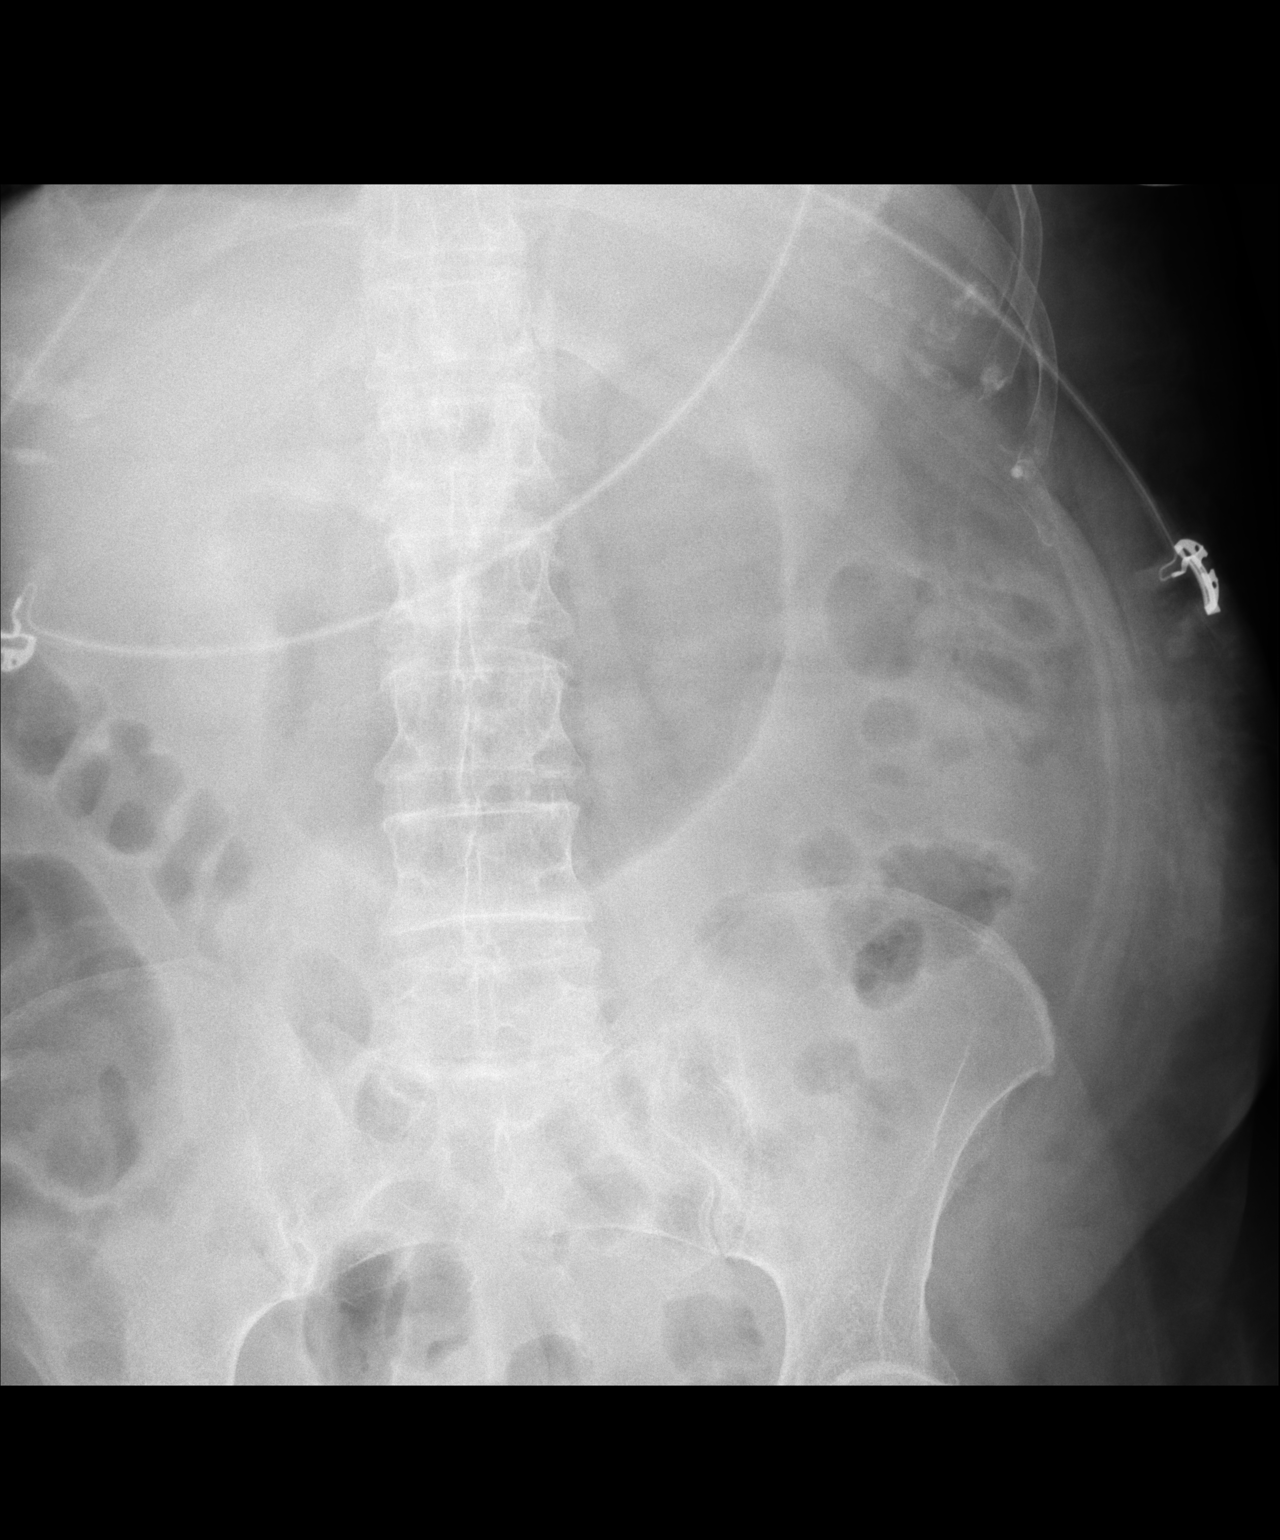

[1 of 1 positions shown; findings below may reference images not displayed]

FINDINGS: The bowel gas pattern is normal. No radio-opaque calculi or other
significant radiographic abnormality are seen.
IMPRESSION: No evidence of bowel obstruction or ileus.

## 2016-03-12 IMAGING — CR DG CHEST 1V PORT
1 series · 1 of 1 positions shown · non-contrast
Comparison: 01/09/2014

CLINICAL DATA: New onset of nausea and shortness of Breath

EXAM:
PORTABLE CHEST - 1 VIEW

[AP]
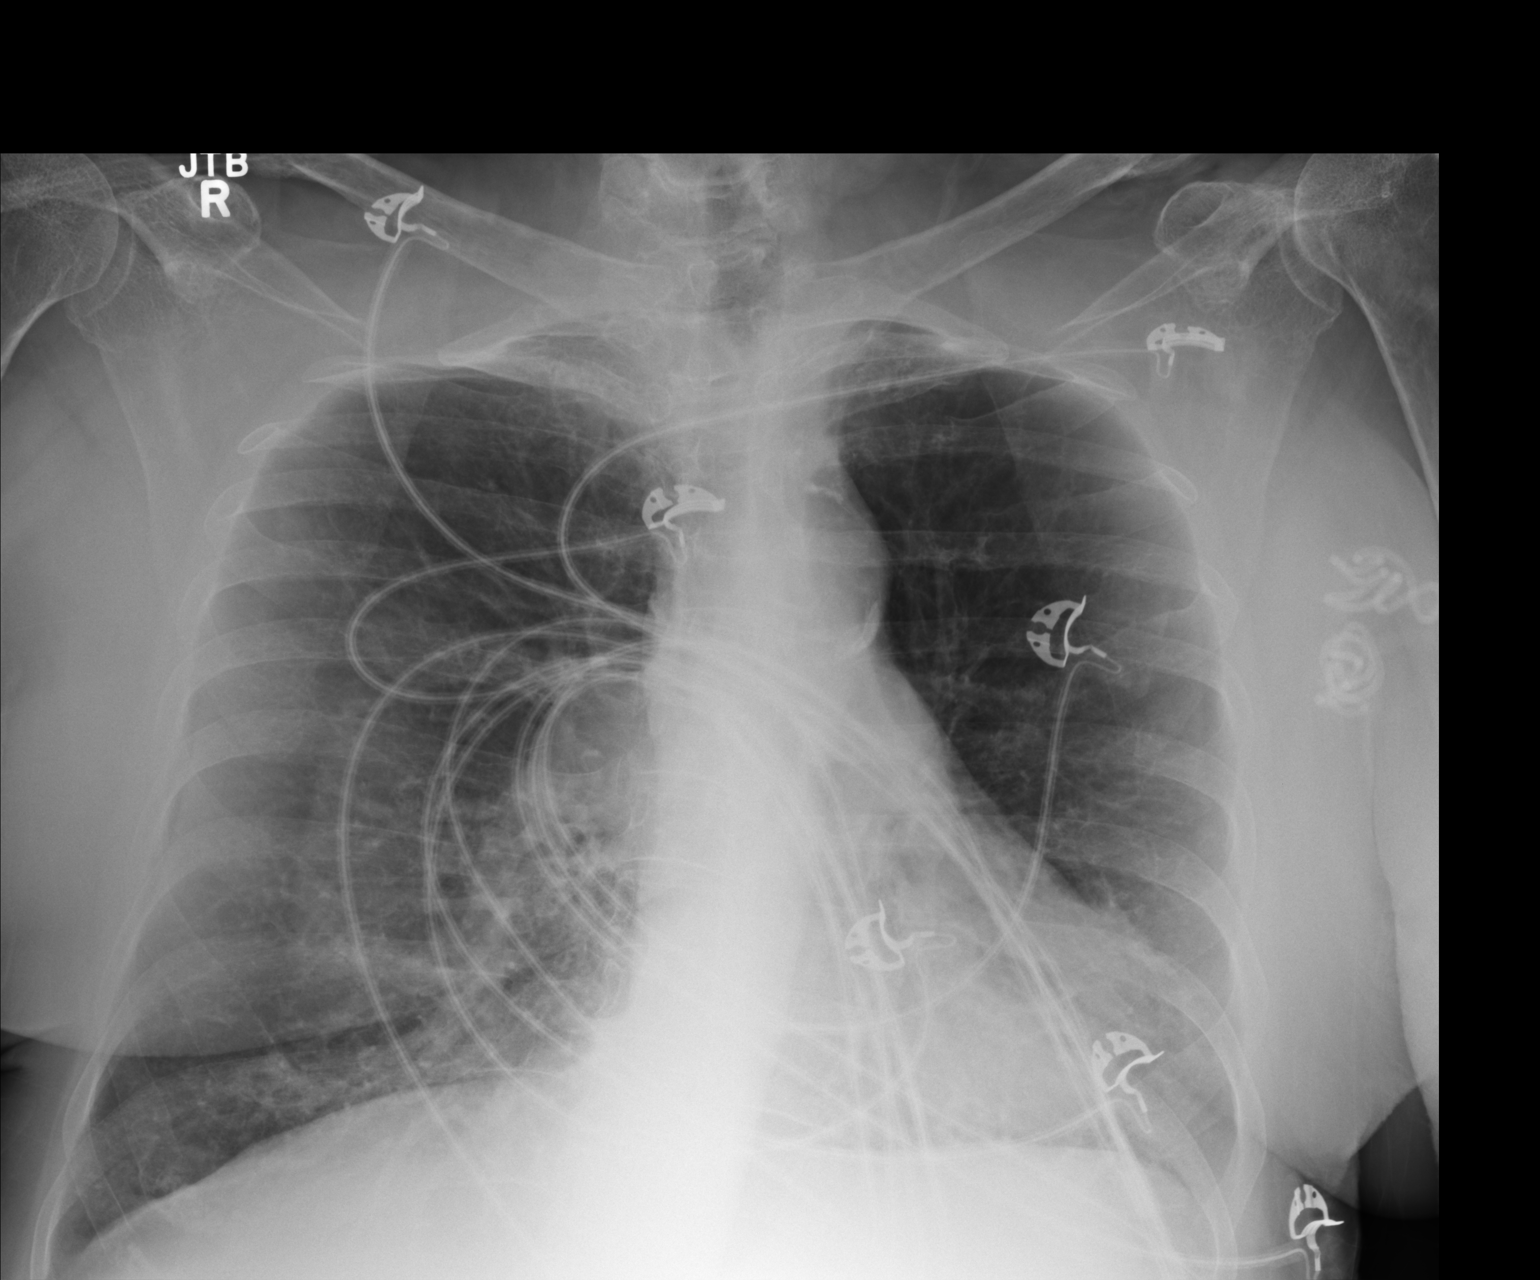

[1 of 1 positions shown; findings below may reference images not displayed]

FINDINGS: Cardiomegaly again noted. Atherosclerotic calcifications of thoracic
aorta. No acute infiltrate or pulmonary edema. Mild basilar
atelectasis.
IMPRESSION: No active disease.  Mild basilar atelectasis.

## 2016-03-13 IMAGING — CR DG CHEST 1V PORT
1 series · 1 of 1 positions shown · non-contrast
Comparison: 01/29/2014.

CLINICAL DATA: Dialysis catheter insertion.

EXAM:
PORTABLE CHEST - 1 VIEW

[AP]
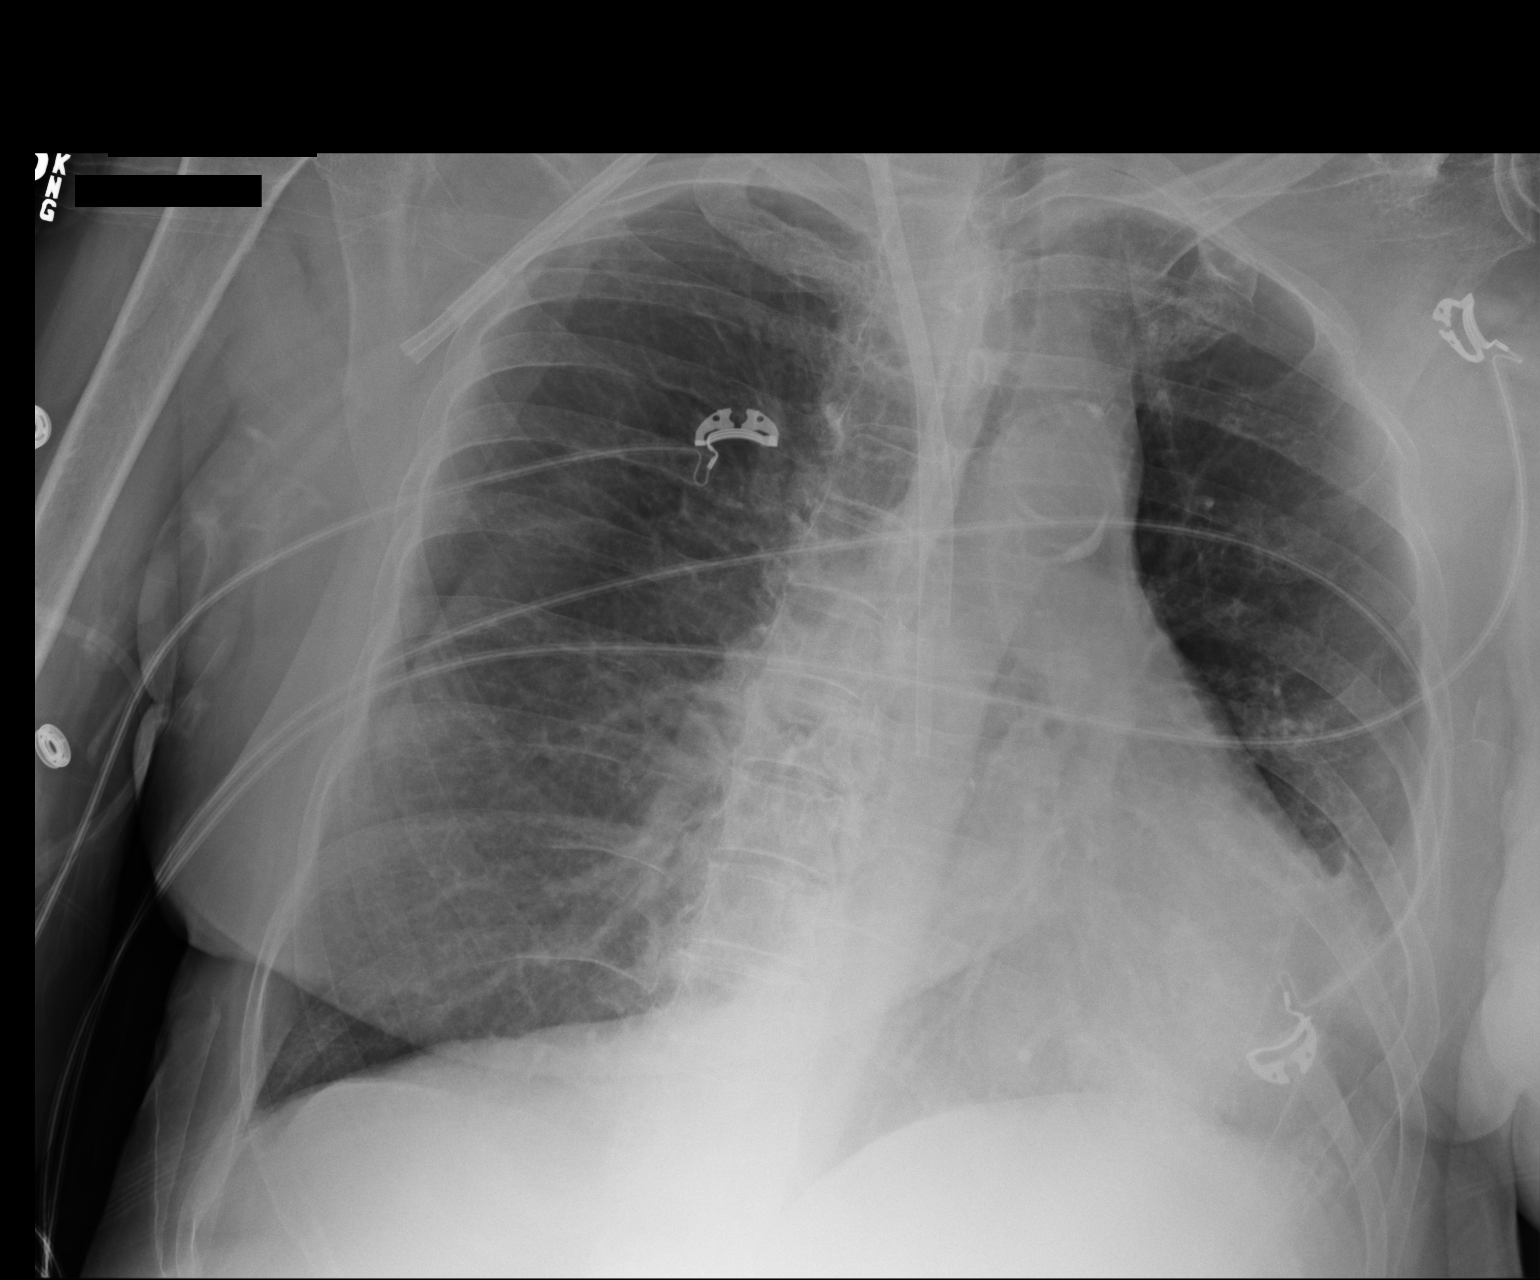

[1 of 1 positions shown; findings below may reference images not displayed]

FINDINGS: Trachea is midline. Heart size stable. Right IJ dialysis catheter
tips project over the SVC. Biapical pleural thickening. Question
developing airspace disease in the left lower lobe. Lungs are
otherwise clear. No pneumothorax. No definite pleural fluid.
IMPRESSION: 1. Right IJ dialysis catheter insertion without pneumothorax.
2. Question developing left lower lobe airspace disease.

## 2020-03-18 DEATH — deceased
